# Patient Record
Sex: Female | Born: 1965 | Race: White | Hispanic: No | Marital: Married | Smoking: Never smoker
Health system: Southern US, Community
[De-identification: ages and names within clinical notes are randomized; demographics above are authoritative.]

## PROBLEM LIST (undated history)

## (undated) DIAGNOSIS — M1990 Unspecified osteoarthritis, unspecified site: Secondary | ICD-10-CM

## (undated) DIAGNOSIS — E785 Hyperlipidemia, unspecified: Secondary | ICD-10-CM

## (undated) DIAGNOSIS — I10 Essential (primary) hypertension: Secondary | ICD-10-CM

## (undated) DIAGNOSIS — E663 Overweight: Secondary | ICD-10-CM

## (undated) DIAGNOSIS — K219 Gastro-esophageal reflux disease without esophagitis: Secondary | ICD-10-CM

## (undated) DIAGNOSIS — M797 Fibromyalgia: Secondary | ICD-10-CM

## (undated) DIAGNOSIS — T7840XA Allergy, unspecified, initial encounter: Secondary | ICD-10-CM

## (undated) DIAGNOSIS — E119 Type 2 diabetes mellitus without complications: Secondary | ICD-10-CM

## (undated) DIAGNOSIS — E876 Hypokalemia: Secondary | ICD-10-CM

## (undated) DIAGNOSIS — M069 Rheumatoid arthritis, unspecified: Secondary | ICD-10-CM

## (undated) DIAGNOSIS — J302 Other seasonal allergic rhinitis: Secondary | ICD-10-CM

## (undated) DIAGNOSIS — M25551 Pain in right hip: Secondary | ICD-10-CM

## (undated) DIAGNOSIS — K5900 Constipation, unspecified: Secondary | ICD-10-CM

## (undated) DIAGNOSIS — M25552 Pain in left hip: Secondary | ICD-10-CM

## (undated) DIAGNOSIS — R739 Hyperglycemia, unspecified: Secondary | ICD-10-CM

## (undated) DIAGNOSIS — J209 Acute bronchitis, unspecified: Secondary | ICD-10-CM

## (undated) HISTORY — DX: Gastro-esophageal reflux disease without esophagitis: K21.9

## (undated) HISTORY — DX: Hyperlipidemia, unspecified: E78.5

## (undated) HISTORY — DX: Hyperglycemia, unspecified: R73.9

## (undated) HISTORY — DX: Pain in left hip: M25.552

## (undated) HISTORY — DX: Overweight: E66.3

## (undated) HISTORY — DX: Type 2 diabetes mellitus without complications: E11.9

## (undated) HISTORY — DX: Constipation, unspecified: K59.00

## (undated) HISTORY — DX: Allergy, unspecified, initial encounter: T78.40XA

## (undated) HISTORY — DX: Essential (primary) hypertension: I10

## (undated) HISTORY — DX: Fibromyalgia: M79.7

## (undated) HISTORY — DX: Unspecified osteoarthritis, unspecified site: M19.90

## (undated) HISTORY — DX: Acute bronchitis, unspecified: J20.9

## (undated) HISTORY — PX: HEMORRHOID SURGERY: SHX153

## (undated) HISTORY — DX: Rheumatoid arthritis, unspecified: M06.9

## (undated) HISTORY — DX: Other seasonal allergic rhinitis: J30.2

## (undated) HISTORY — DX: Pain in right hip: M25.551

## (undated) HISTORY — DX: Hypokalemia: E87.6

---

## 2011-10-24 ENCOUNTER — Ambulatory Visit (INDEPENDENT_AMBULATORY_CARE_PROVIDER_SITE_OTHER): Payer: BC Managed Care – PPO | Admitting: Internal Medicine

## 2011-10-24 ENCOUNTER — Encounter: Payer: Self-pay | Admitting: Internal Medicine

## 2011-10-24 VITALS — BP 128/82 | HR 126 | Temp 98.3°F | Resp 14 | Ht 64.0 in | Wt 137.2 lb

## 2011-10-24 DIAGNOSIS — Z79899 Other long term (current) drug therapy: Secondary | ICD-10-CM

## 2011-10-24 DIAGNOSIS — E785 Hyperlipidemia, unspecified: Secondary | ICD-10-CM

## 2011-10-24 DIAGNOSIS — I10 Essential (primary) hypertension: Secondary | ICD-10-CM

## 2011-10-24 LAB — CBC WITH DIFFERENTIAL/PLATELET
Eosinophils Relative: 1 % (ref 0–5)
HCT: 41.2 % (ref 36.0–46.0)
Hemoglobin: 14.4 g/dL (ref 12.0–15.0)
Lymphocytes Relative: 30 % (ref 12–46)
Lymphs Abs: 1.8 10*3/uL (ref 0.7–4.0)
MCV: 87.1 fL (ref 78.0–100.0)
Monocytes Absolute: 0.6 10*3/uL (ref 0.1–1.0)
Monocytes Relative: 10 % (ref 3–12)
Neutro Abs: 3.6 10*3/uL (ref 1.7–7.7)
WBC: 6.1 10*3/uL (ref 4.0–10.5)

## 2011-10-24 LAB — BASIC METABOLIC PANEL
BUN: 15 mg/dL (ref 6–23)
CO2: 26 mEq/L (ref 19–32)
Chloride: 101 mEq/L (ref 96–112)
Potassium: 3.8 mEq/L (ref 3.5–5.3)

## 2011-10-24 LAB — LIPID PANEL
Cholesterol: 253 mg/dL — ABNORMAL HIGH (ref 0–200)
HDL: 42 mg/dL (ref >39)
Total CHOL/HDL Ratio: 6 Ratio
Triglycerides: 202 mg/dL — ABNORMAL HIGH (ref <150)
VLDL: 40 mg/dL (ref 0–40)

## 2011-10-24 LAB — HEPATIC FUNCTION PANEL
ALT: 11 U/L (ref 0–35)
Albumin: 4.2 g/dL (ref 3.5–5.2)
Indirect Bilirubin: 0.9 mg/dL (ref 0.0–0.9)
Total Protein: 7.6 g/dL (ref 6.0–8.3)

## 2011-10-24 NOTE — Patient Instructions (Signed)
Please schedule fasting labs prior to next visit chem7-v58.69, lipid/lft 272.4 

## 2011-10-25 DIAGNOSIS — E7800 Pure hypercholesterolemia, unspecified: Secondary | ICD-10-CM | POA: Insufficient documentation

## 2011-10-25 DIAGNOSIS — I10 Essential (primary) hypertension: Secondary | ICD-10-CM | POA: Insufficient documentation

## 2011-10-25 NOTE — Assessment & Plan Note (Signed)
Normotensive and stable. Continue current regimen. Monitor bp as outpt and followup in clinic as scheduled. Obtain cbc, chem7 and tsh  

## 2011-10-25 NOTE — Assessment & Plan Note (Signed)
Intolerant of lipitor and zetia. Obtain lipid/lft

## 2011-10-25 NOTE — Progress Notes (Signed)
  Subjective:    Patient ID: Marissa Guzman, female    DOB: 05/29/1965, 46 y.o.   MRN: [id number]  HPI Pt presents to clinic to establish care and for evaluation of hyperlipidemia. H/o hyperlipidemia intolerant of lipitor due to myalgias and zetia due to gi upset. Recalls endocrinology consult in the past. BP reviewed as normotensive. Uses phentermine periodically due to weight gain.   Past Medical History  Diagnosis Date  . HTN (hypertension)   . Hyperlipidemia   . Seasonal allergies    Past Surgical History  Procedure Date  . Hemorrhoid surgery     two  . Cesarean section     reports that she has never smoked. She does not have any smokeless tobacco history on file. Her alcohol and drug histories not on file. family history includes Dementia in her maternal grandmother; Diabetes in her maternal grandmother; Heart disease in her father; Hyperlipidemia in her maternal grandmother, mother, and sister; Hypertension in her father, mother, and sister; Lung cancer in her father; and Melanoma in her father. Allergies  Allergen Reactions  . Tetracyclines & Related Other (See Comments)    GI Issues.     Review of Systems  Respiratory: Negative for shortness of breath.   Cardiovascular: Negative for chest pain.  Musculoskeletal: Negative for myalgias.  All other systems reviewed and are negative.       Objective:   Physical Exam  Nursing note and vitals reviewed. Constitutional: She appears well-developed and well-nourished. No distress.  HENT:  Head: Normocephalic and atraumatic.  Right Ear: External ear normal.  Left Ear: External ear normal.  Eyes: Conjunctivae are normal. No scleral icterus.  Neck: Neck supple. No thyromegaly present.  Cardiovascular: Normal rate, regular rhythm and normal heart sounds.  Exam reveals no gallop and no friction rub.   No murmur heard. Pulmonary/Chest: Effort normal and breath sounds normal. No respiratory distress. She has no wheezes. She has  no rales.  Lymphadenopathy:    She has no cervical adenopathy.  Neurological: She is alert.  Skin: She is not diaphoretic.  Psychiatric: She has a normal mood and affect.          Assessment & Plan:

## 2011-11-10 ENCOUNTER — Telehealth: Payer: Self-pay | Admitting: *Deleted

## 2011-11-10 DIAGNOSIS — E785 Hyperlipidemia, unspecified: Secondary | ICD-10-CM

## 2011-11-10 MED ORDER — NIACIN ER 500 MG PO CPCR: 500.0000 mg | ORAL_CAPSULE | Freq: Every day | ORAL | Status: DC

## 2011-11-10 NOTE — Telephone Encounter (Signed)
Notes Recorded by Regis Bill, CMA on 11/07/2011 at 5:29 PM [facility] with contact name & number for pt call back to discuss results & further MD instructions/SLS  Spoke with patient about results & MD recommendations, pt chooses to try Niacin with prior ASA dosage instructions; will repeat fasting labs in 6-wks/SLS Future lab orders placed; new Rx to pharmacy.

## 2011-11-10 NOTE — Telephone Encounter (Signed)
Message copied by Regis Bill on Mon Nov 10, 2011  4:26 PM ------      Message from: Edwyna Perfect      Created: Wed Nov 05, 2011  3:31 PM       Chol remains high. ldl 170+ on crestor 40mg  qd. Max dose. Can't tolerate zetia. Only a few reasonable choices left. Either niacin 500mg  po qhs (take asa 30 mins before to decrease flushing- and increase to 1000mg  po qhs after two weeks) or questran light one packet mixed with liquid bid. Either one needs lipid/lft 272.4 6wks later

## 2011-12-15 LAB — HEPATIC FUNCTION PANEL
Albumin: 4.2 g/dL (ref 3.5–5.2)
Indirect Bilirubin: 0.5 mg/dL (ref 0.0–0.9)
Total Protein: 7 g/dL (ref 6.0–8.3)

## 2011-12-15 LAB — LIPID PANEL
HDL: 40 mg/dL (ref >39)
LDL Cholesterol: 353 mg/dL — ABNORMAL HIGH (ref 0–99)
Triglycerides: 303 mg/dL — ABNORMAL HIGH (ref <150)
VLDL: 61 mg/dL — ABNORMAL HIGH (ref 0–40)

## 2011-12-15 NOTE — Telephone Encounter (Signed)
Lab orders released/SLS 

## 2011-12-15 NOTE — Addendum Note (Signed)
Addended by: Regis Bill on: 12/15/2011 03:25 PM   Modules accepted: Orders

## 2011-12-24 ENCOUNTER — Other Ambulatory Visit: Payer: Self-pay | Admitting: Internal Medicine

## 2011-12-24 DIAGNOSIS — E785 Hyperlipidemia, unspecified: Secondary | ICD-10-CM

## 2011-12-30 ENCOUNTER — Other Ambulatory Visit: Payer: Self-pay | Admitting: Pharmacist

## 2011-12-30 ENCOUNTER — Ambulatory Visit (INDEPENDENT_AMBULATORY_CARE_PROVIDER_SITE_OTHER): Payer: BC Managed Care – PPO | Admitting: Pharmacist

## 2011-12-30 VITALS — Wt 140.0 lb

## 2011-12-30 DIAGNOSIS — E785 Hyperlipidemia, unspecified: Secondary | ICD-10-CM

## 2011-12-30 MED ORDER — ATORVASTATIN CALCIUM 80 MG PO TABS: 80.0000 mg | ORAL_TABLET | Freq: Every day | ORAL | Status: DC

## 2011-12-30 NOTE — Assessment & Plan Note (Signed)
Reviewed pt's labs.  Her cholesterol while on statin showed TG-202 and LDL- 171.  Once pt switched to only niacin and fenofibrate, her TG and LDL increased to 303 and 353 respectively.  Her only CV risk factor is HTN.  LDL goal of <160 acceptable or can aim for more aggressive goal of <130.  Will need to restart patient on statin.  Pt requests retry of Lipitor rather than Crestor since she states she had better control with the Lipitor.  Will restart at 80mg  daily but will not continue fenofibrate as this may have contributed to her myalgias.  Will hold off on Niacin for now to see what her cholesterol looks like with statin alone.  May consider retry of zetia with Lipitor at next visit to help lower LDL to a greater extent than addition of fibrate or niacin if need be.  Will recheck labs in 6-8 weeks.

## 2011-12-30 NOTE — Progress Notes (Signed)
HPI  Marissa Guzman is a 46 yo F who comes in today for an initial Lipid clinic visit.  She recently established care with Dr. Rodena Medin.  Her PMH is significant for hypertension and hyperlipidemia.  FH is noncontributory.  Pt is a non-smoker.  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   Reviewed pt's history of cholesterol medications.  She has previously taken Lipitor 80mg  daily and fenofibrate 160mg  daily for years but then developed severe myalgias and was switched to Crestor 40mg  and fenofibrate 160mg .  She has tolerated this with no issues but did not have as good of control of her cholesterol.  After her blood work in August, there was some miscommunication and patient stopped Crestor but continued fenofibrate and added Niacin.  She was able to titrate up to 1000mg  daily with no side effects.  The most recent blood work is a result of only fenofibrate and niacin and no statin.  The only other medication she has tried in the past was Zetia and she reports GI upset.  Of note, pt did stop all cholesterol medications last week in anticipation for today's visit because she knew we would want to change something.   Dietary Review Breakfast- Life cereal, egg and cheese on tortilla, or bacon and cheese on a bun Lunch- ham or [place] sandwich from subway, baked potato chips, McDonald's snack wrap and fries, or chick-fil-a.  She does eat out most days due to [employer]- pt cooks half the time and goes out to eat the other times.  Some of their favorite restaurants are [employer], [place] or a [employer].  She will cook chicken, pork chops, spaghetti, roasted potatoes, corn, green beans, or brown rice.  She does not fry any of her food and does not eat salads because her husband and son do not eat them.   Snacks- pt doesn't snack much but does like fruit or wheat thins with cheese  Drinks- pt admits to not having a lot of fluid intake every day.  She does not like to drink water because she does not want  to have to use the restroom in public.  She will drink Diet Dr. Reino Kent each day.   Exercise Pt is currently not exercising.  She has had a personal trainer in the past but has stopped doing this due to personality issues with the trainer.  She is still a member at the gym but again does not go because of the personnel.   She is going to start taking her phentermine again today to help with weight loss.  When she has done this in the past, she will see a weight loss of 10-15 lbs but quickly puts it back on once she stops.   Current Outpatient Prescriptions on File Prior to Visit  Medication Sig Dispense Refill  . fluticasone (FLONASE) 50 MCG/ACT nasal spray Place 2 sprays into the nose daily as needed.      . fosinopril-hydrochlorothiazide (MONOPRIL-HCT) 10-12.5 MG per tablet Take 1 tablet by mouth daily.      . norgestrel-ethinyl estradiol (LO/OVRAL,CRYSELLE) 0.3-30 MG-MCG tablet Take 1 tablet by mouth daily.      . phentermine 37.5 MG capsule Take 37.5 mg by mouth daily as needed.      . psyllium (METAMUCIL) 58.6 % powder Take 1 packet by mouth daily.      Marland Kitchen atorvastatin (LIPITOR) 80 MG tablet Take 1 tablet (80 mg total) by mouth daily.  30 tablet  3

## 2011-12-30 NOTE — Patient Instructions (Addendum)
Stop Crestor, fenofibrate and niacin  Restart Lipitor 80mg  daily.   With diet- Try to limit cheese and starchy vegetables.  Replace with more high fiber vegetables and limit portions  Try to exercise at least 15-30 minutes most days of the week.   Recheck labs in 6-8 weeks.

## 2012-02-17 ENCOUNTER — Telehealth: Payer: Self-pay | Admitting: Internal Medicine

## 2012-02-17 MED ORDER — FOSINOPRIL SODIUM-HCTZ 10-12.5 MG PO TABS: 1.0000 | ORAL_TABLET | Freq: Every day | ORAL | Status: DC

## 2012-02-17 NOTE — Telephone Encounter (Signed)
Rx to pharmacy/SLS 

## 2012-02-17 NOTE — Telephone Encounter (Signed)
°  Fosinopril 10-12.5 refill to [facility] 90 day supply

## 2012-02-18 ENCOUNTER — Telehealth: Payer: Self-pay | Admitting: *Deleted

## 2012-02-18 DIAGNOSIS — E785 Hyperlipidemia, unspecified: Secondary | ICD-10-CM

## 2012-02-18 LAB — HEPATIC FUNCTION PANEL
AST: 23 U/L (ref 0–37)
Alkaline Phosphatase: 58 U/L (ref 39–117)
Bilirubin, Direct: 0.2 mg/dL (ref 0.0–0.3)
Indirect Bilirubin: 1.1 mg/dL — ABNORMAL HIGH (ref 0.0–0.9)
Total Bilirubin: 1.3 mg/dL — ABNORMAL HIGH (ref 0.3–1.2)

## 2012-02-18 LAB — LIPID PANEL
HDL: 41 mg/dL (ref >39)
LDL Cholesterol: 164 mg/dL — ABNORMAL HIGH (ref 0–99)
Total CHOL/HDL Ratio: 5.7 Ratio
VLDL: 28 mg/dL (ref 0–40)

## 2012-02-18 NOTE — Telephone Encounter (Signed)
Pt presented to the lab for lipid panel and hepatic function panel per Weston Brass, PharmD with the [facility]. Orders entered and given to the lab.

## 2012-02-23 ENCOUNTER — Ambulatory Visit: Payer: BC Managed Care – PPO | Admitting: Pharmacist

## 2012-03-04 ENCOUNTER — Ambulatory Visit (INDEPENDENT_AMBULATORY_CARE_PROVIDER_SITE_OTHER): Payer: BC Managed Care – PPO | Admitting: Pharmacist

## 2012-03-04 DIAGNOSIS — E785 Hyperlipidemia, unspecified: Secondary | ICD-10-CM

## 2012-03-04 MED ORDER — ATORVASTATIN CALCIUM 80 MG PO TABS: 80.0000 mg | ORAL_TABLET | Freq: Every day | ORAL | Status: DC

## 2012-03-04 NOTE — Patient Instructions (Signed)
Great job on the improvement in your cholesterol  Continue Lipitor 80mg  daily.    Try to make some of the lifestyle changes after the holidays.   We will follow up in 6 months.

## 2012-03-04 NOTE — Assessment & Plan Note (Signed)
Pt's cholesterol much improved with Lipitor 80mg .  TC- 233 (goal<200), TG- 142 (goal<150), HDL- 41 (goal>40) and LDL- 164.  LFTs are WNL.  Pt on high dose statin.  She is being treated for primary prevention.  In light of new guidelines, will not add additional therapy at this time.  Anticipate with lifestyle changes, cholesterol will improve even more.  If LDL continues to decrease, may be able to titrate Lipitor down to 40mg .  Will follow up in 6 months.

## 2012-03-04 NOTE — Progress Notes (Signed)
HPI  Marissa Guzman is a 46 yo F who comes in today for follow up Lipid clinic visit.  She recently established care with Dr. Rodena Medin.  Her PMH is significant for hypertension and hyperlipidemia.  FH is noncontributory.  Pt is a non-smoker.  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   Pt is currently taking Lipitor 80mg  daily for her cholesterol.  She has had no issues with muscle pains or myalgias since the last visit.   Pt has not made any lifestyle changes since the last visit.  States that she has been busy with the holidays.  She did not take phentermine on any regular schedule and so no change in weight.  Her goal is to start back to the gym and controlling her diet after the new year.   Current Outpatient Prescriptions on File Prior to Visit  Medication Sig Dispense Refill  . atorvastatin (LIPITOR) 80 MG tablet Take 1 tablet (80 mg total) by mouth daily.  30 tablet  3  . fluticasone (FLONASE) 50 MCG/ACT nasal spray Place 2 sprays into the nose daily as needed.      . fosinopril-hydrochlorothiazide (MONOPRIL-HCT) 10-12.5 MG per tablet Take 1 tablet by mouth daily.  90 tablet  1  . norgestrel-ethinyl estradiol (LO/OVRAL,CRYSELLE) 0.3-30 MG-MCG tablet Take 1 tablet by mouth daily.      . phentermine 37.5 MG capsule Take 37.5 mg by mouth daily as needed.      . psyllium (METAMUCIL) 58.6 % powder Take 1 packet by mouth daily.

## 2012-04-12 ENCOUNTER — Telehealth: Payer: Self-pay | Admitting: Internal Medicine

## 2012-04-12 DIAGNOSIS — E785 Hyperlipidemia, unspecified: Secondary | ICD-10-CM

## 2012-04-12 DIAGNOSIS — Z79899 Other long term (current) drug therapy: Secondary | ICD-10-CM

## 2012-04-12 NOTE — Telephone Encounter (Signed)
Please schedule fasting labs prior to next visit  chem7-v58.69, lipid/lft-272.4  Patient has upcoming appointment in late February. She will be going to [facility]

## 2012-04-27 ENCOUNTER — Ambulatory Visit: Payer: BC Managed Care – PPO | Admitting: Internal Medicine

## 2012-05-05 LAB — LIPID PANEL
Cholesterol: 230 mg/dL — ABNORMAL HIGH (ref 0–200)
Total CHOL/HDL Ratio: 5.8 Ratio
Triglycerides: 158 mg/dL — ABNORMAL HIGH (ref <150)
VLDL: 32 mg/dL (ref 0–40)

## 2012-05-05 LAB — BASIC METABOLIC PANEL
Calcium: 9.5 mg/dL (ref 8.4–10.5)
Glucose, Bld: 75 mg/dL (ref 70–99)
Potassium: 4.1 mEq/L (ref 3.5–5.3)
Sodium: 140 mEq/L (ref 135–145)

## 2012-05-05 LAB — HEPATIC FUNCTION PANEL
ALT: 16 U/L (ref 0–35)
AST: 21 U/L (ref 0–37)
Bilirubin, Direct: 0.1 mg/dL (ref 0.0–0.3)
Indirect Bilirubin: 0.7 mg/dL (ref 0.0–0.9)
Total Protein: 6.6 g/dL (ref 6.0–8.3)

## 2012-05-05 NOTE — Telephone Encounter (Signed)
Quick Note:  Patient stated she has an appt next week ______

## 2012-05-05 NOTE — Telephone Encounter (Signed)
Quick Note:  Patient Informed and voiced understanding ______ 

## 2012-05-11 ENCOUNTER — Ambulatory Visit (INDEPENDENT_AMBULATORY_CARE_PROVIDER_SITE_OTHER): Payer: BC Managed Care – PPO | Admitting: Family Medicine

## 2012-05-11 ENCOUNTER — Encounter: Payer: Self-pay | Admitting: Family Medicine

## 2012-05-11 VITALS — BP 140/86 | HR 74 | Temp 98.4°F | Ht 64.0 in | Wt 136.1 lb

## 2012-05-11 DIAGNOSIS — Z1211 Encounter for screening for malignant neoplasm of colon: Secondary | ICD-10-CM | POA: Insufficient documentation

## 2012-05-11 DIAGNOSIS — Z0000 Encounter for general adult medical examination without abnormal findings: Secondary | ICD-10-CM

## 2012-05-11 DIAGNOSIS — T7840XD Allergy, unspecified, subsequent encounter: Secondary | ICD-10-CM

## 2012-05-11 DIAGNOSIS — Z5189 Encounter for other specified aftercare: Secondary | ICD-10-CM

## 2012-05-11 DIAGNOSIS — J209 Acute bronchitis, unspecified: Secondary | ICD-10-CM

## 2012-05-11 DIAGNOSIS — I10 Essential (primary) hypertension: Secondary | ICD-10-CM

## 2012-05-11 DIAGNOSIS — E785 Hyperlipidemia, unspecified: Secondary | ICD-10-CM

## 2012-05-11 MED ORDER — AZITHROMYCIN 250 MG PO TABS: ORAL_TABLET | ORAL | Status: DC

## 2012-05-11 MED ORDER — HYDROCOD POLST-CHLORPHEN POLST 10-8 MG/5ML PO LQCR: 5.0000 mL | Freq: Every evening | ORAL | Status: DC | PRN

## 2012-05-11 MED ORDER — GUAIFENESIN ER 600 MG PO TB12: 600.0000 mg | ORAL_TABLET | Freq: Two times a day (BID) | ORAL | Status: DC

## 2012-05-11 NOTE — Patient Instructions (Addendum)
Annual exam with labs prior to visit, lipid, renal, hepatic, cbc, tsh Krill oil, MegaRed oil caps daily  No trans fats=partially hydrogenated oils  Diphtheria/Tetanus Toxoids; Pertussis Vaccine, DTP injection What is this medicine? DIPHTHERIA and TETANUS TOXOIDS; PERTUSSIS VACCINE (dif THEER ee uh and TET n [name] TOK soids; per TUS iss vak SEEN) is used to prevent diphtheria, tetanus, and pertussis infections. This medicine may be used for other purposes; ask your health care provider or pharmacist if you have questions. What should I tell my health care provider before I take this medicine? They need to know if you have any of these conditions: -blood disorders like hemophilia -fever or infection -immune system problems -neurologic disease -seizures -an unusual or allergic reaction to vaccines, thimerosal, latex, other medicines, foods, dyes, or preservatives -pregnant or trying to get pregnant -breast-feeding How should I use this medicine? This vaccine is for injection into a muscle. It is given by a health care professional. A copy of Vaccine Information Statements will be given before each vaccination. Read this sheet carefully each time. The sheet may change frequently. Talk to your pediatrician regarding the use of this vaccine in children. While the DTP vaccine may be given to children ages 6 weeks to 7 years and the Tdap vaccine may be given to children at least 98 years old (Boostrix(R)) or at least 47 years old (Adacel(TM)), precautions do apply. Overdosage: If you think you have taken too much of this medicine contact a poison control center or emergency room at once. NOTE: This medicine is only for you. Do not share this medicine with others. What if I miss a dose? It is important not to miss your dose. Call your doctor or health care professional if you are unable to keep an appointment. What may interact with this medicine? -immune globulin -medicines that suppress your  immune function like adalimumab, anakinra, infliximab -medicines to treat cancer -medicines that treat or prevent blood clots like warfarin, enoxaparin, and dalteparin -steroid medicines like prednisone or cortisone This list may not describe all possible interactions. Give your health care provider a list of all the medicines, herbs, non-prescription drugs, or dietary supplements you use. Also tell them if you smoke, drink alcohol, or use illegal drugs. Some items may interact with your medicine. What should I watch for while using this medicine? See your health care provider for all shots of this vaccine as directed. To have protection from infection, you must have 3 shots of this vaccine plus boosters as needed. Tell your doctor right away if you have any serious or unusual side effects after getting this vaccine. What side effects may I notice from receiving this medicine? Side effects that you should report to your doctor or health care professional as soon as possible: -allergic reactions like skin rash, itching or hives, swelling of the face, lips, or tongue -breathing problems -fever of 103 degrees F or more -flu-like symptoms -inconsolable crying -infection -pain, tingling, numbness in the hands or feet -seizures -swelling of arm or leg that was injected -unusually weak or tired Side effects that usually do not require immediate medical attention (report these side effects to your doctor or health care professional if they continue or are bothersome): -fussy, irritable -loss of appetite -fever of 102 degrees F or less -pain, tenderness, redness, swelling, or a 'knot' at site where injected -vomiting This list may not describe all possible side effects. Call your doctor for medical advice about side effects. You may report side effects  to FDA at 1-800-FDA-1088. Where should I keep my medicine? This drug is given in a hospital or clinic and will not be stored at home. NOTE: This  sheet is a summary. It may not cover all possible information. If you have questions about this medicine, talk to your doctor, pharmacist, or health care provider.  2012, Elsevier/Gold Standard. (06/24/2007 2:07:33 PM)

## 2012-05-16 ENCOUNTER — Encounter: Payer: Self-pay | Admitting: Family Medicine

## 2012-05-16 DIAGNOSIS — J209 Acute bronchitis, unspecified: Secondary | ICD-10-CM

## 2012-05-16 DIAGNOSIS — T7840XA Allergy, unspecified, initial encounter: Secondary | ICD-10-CM

## 2012-05-16 HISTORY — DX: Acute bronchitis, unspecified: J20.9

## 2012-05-16 HISTORY — DX: Allergy, unspecified, initial encounter: T78.40XA

## 2012-05-16 NOTE — Assessment & Plan Note (Signed)
Encouraged daily Zyrtec and Flonase

## 2012-05-16 NOTE — Assessment & Plan Note (Signed)
Well controlled no changes 

## 2012-05-16 NOTE — Assessment & Plan Note (Signed)
Mild, avoid trans fats, consider MegaRed caps

## 2012-05-16 NOTE — Progress Notes (Signed)
Patient ID: CLIO GERHART, female   DOB: 10/19/1965, 47 y.o.   MRN: [id number] TAYLAR HARTSOUGH [id number] Dec 08, 1965 05/16/2012      Progress Note-Follow Up  Subjective  Chief Complaint  Chief Complaint  Patient presents with  . Follow-up    6 month  . Cough    w/ phlegm (unsure if colored) X 4 days, nasal drainage X 2 days, headaches X 4 days- off and  on , no headaches    HPI  Patient is a 47 year old Caucasian female who is in today for followup. She has been struggling with increased congestion and malaise for about 4 days now. Cough is productive of phlegm she has postnasal drip and chills. She complains of cough which is productive and clear rhinorrhea. Denies any nausea, vomiting, diarrhea. Has started her Flonase and her Zyrtec her symptoms persist. No chest pain or palpitations. No shortness or breath or headaches. Past Medical History  Diagnosis Date  . HTN (hypertension)   . Hyperlipidemia   . Seasonal allergies   . Acute bronchitis 05/16/2012  . Allergic state 05/16/2012    Past Surgical History  Procedure Laterality Date  . Hemorrhoid surgery      two  . Cesarean section      Family History  Problem Relation Age of Onset  . Heart disease Father     Deceased  . Hyperlipidemia Mother   . Hyperlipidemia Maternal Grandmother   . Hypertension Father   . Hypertension Mother   . Lung cancer Father   . Melanoma Father     Eye Removal  . Diabetes Maternal Grandmother   . Hypertension Sister     x2  . Hyperlipidemia Sister     x2  . Dementia Maternal Grandmother     History   Social History  . Marital Status: Married    Spouse Name: N/A    Number of Children: N/A  . Years of Education: N/A   Occupational History  . Not on file.   Social History Main Topics  . Smoking status: Never Smoker   . Smokeless tobacco: Not on file  . Alcohol Use: Not on file  . Drug Use: Not on file  . Sexually Active: Not on file   Other Topics Concern  . Not on file    Social History Narrative  . No narrative on file    Current Outpatient Prescriptions on File Prior to Visit  Medication Sig Dispense Refill  . atorvastatin (LIPITOR) 80 MG tablet Take 1 tablet (80 mg total) by mouth daily.  90 tablet  3  . cetirizine (ZYRTEC) 10 MG tablet Take 10 mg by mouth daily as needed.      . fluticasone (FLONASE) 50 MCG/ACT nasal spray Place 2 sprays into the nose daily as needed.      . fosinopril-hydrochlorothiazide (MONOPRIL-HCT) 10-12.5 MG per tablet Take 1 tablet by mouth daily.  90 tablet  1  . norgestrel-ethinyl estradiol (LO/OVRAL,CRYSELLE) 0.3-30 MG-MCG tablet Take 1 tablet by mouth daily.      . phentermine 37.5 MG capsule Take 37.5 mg by mouth daily as needed.      . psyllium (METAMUCIL) 58.6 % powder Take 1 packet by mouth daily.       No current facility-administered medications on file prior to visit.    Allergies  Allergen Reactions  . Tetracyclines & Related Other (See Comments)    GI Issues.    Review of Systems  Review of Systems  Constitutional: Positive for  chills and malaise/fatigue. Negative for fever.  HENT: Positive for congestion. Negative for ear pain, sore throat and ear discharge.   Eyes: Negative for discharge.  Respiratory: Positive for cough and sputum production. Negative for shortness of breath.   Cardiovascular: Negative for chest pain, palpitations and leg swelling.  Gastrointestinal: Negative for nausea, abdominal pain and diarrhea.  Genitourinary: Negative for dysuria.  Musculoskeletal: Positive for myalgias. Negative for falls.  Skin: Negative for rash.  Neurological: Negative for loss of consciousness and headaches.  Endo/Heme/Allergies: Negative for polydipsia.  Psychiatric/Behavioral: Negative for depression and suicidal ideas. The patient is not nervous/anxious and does not have insomnia.     Objective  BP 140/86  Pulse 74  Temp(Src) 98.4 F (36.9 C) (Oral)  Ht 5\' 4"  (1.626 m)  Wt 136 lb 1.3 oz  (61.725 kg)  BMI 23.35 kg/m2  SpO2 99%  Physical Exam  Physical Exam  Constitutional: She is oriented to person, place, and time and well-developed, well-nourished, and in no distress. No distress.  HENT:  Head: Normocephalic and atraumatic.  Eyes: Conjunctivae are normal.  Neck: Neck supple. No thyromegaly present.  Cardiovascular: Normal rate, regular rhythm and normal heart sounds.   No murmur heard. Pulmonary/Chest: Effort normal and breath sounds normal. She has no wheezes.  Abdominal: She exhibits no distension and no mass.  Musculoskeletal: She exhibits no edema.  Lymphadenopathy:    She has no cervical adenopathy.  Neurological: She is alert and oriented to person, place, and time.  Skin: Skin is warm and dry. No rash noted. She is not diaphoretic.  Psychiatric: Memory, affect and judgment normal.    Lab Results  Component Value Date   TSH 1.212 10/24/2011   Lab Results  Component Value Date   WBC 6.1 10/24/2011   HGB 14.4 10/24/2011   HCT 41.2 10/24/2011   MCV 87.1 10/24/2011   PLT 302 10/24/2011   Lab Results  Component Value Date   CREATININE 0.85 05/04/2012   BUN 12 05/04/2012   NA 140 05/04/2012   K 4.1 05/04/2012   CL 104 05/04/2012   CO2 27 05/04/2012   Lab Results  Component Value Date   ALT 16 05/04/2012   AST 21 05/04/2012   ALKPHOS 56 05/04/2012   BILITOT 0.8 05/04/2012   Lab Results  Component Value Date   CHOL 230* 05/04/2012   Lab Results  Component Value Date   HDL 40 05/04/2012   Lab Results  Component Value Date   LDLCALC 158* 05/04/2012   Lab Results  Component Value Date   TRIG 158* 05/04/2012   Lab Results  Component Value Date   CHOLHDL 5.8 05/04/2012     Assessment & Plan  Unspecified essential hypertension Well controlled no changes.  Other and unspecified hyperlipidemia Mild, avoid trans fats, consider MegaRed caps  Acute bronchitis Started on Antibiotics and Mucinex, encouraged probiotics, increased rest and fluids  Allergic  state Encouraged daily Zyrtec and Flonase

## 2012-05-16 NOTE — Assessment & Plan Note (Signed)
Started on Antibiotics and Mucinex, encouraged probiotics, increased rest and fluids

## 2012-08-02 ENCOUNTER — Other Ambulatory Visit (INDEPENDENT_AMBULATORY_CARE_PROVIDER_SITE_OTHER): Payer: BC Managed Care – PPO

## 2012-08-02 DIAGNOSIS — E785 Hyperlipidemia, unspecified: Secondary | ICD-10-CM

## 2012-08-02 LAB — LIPID PANEL
Cholesterol: 230 mg/dL — ABNORMAL HIGH (ref 0–200)
Total CHOL/HDL Ratio: 6
Triglycerides: 130 mg/dL (ref 0.0–149.0)

## 2012-08-02 LAB — LDL CHOLESTEROL, DIRECT: Direct LDL: 165.1 mg/dL

## 2012-08-02 LAB — HEPATIC FUNCTION PANEL
ALT: 24 U/L (ref 0–35)
AST: 29 U/L (ref 0–37)
Albumin: 3.6 g/dL (ref 3.5–5.2)

## 2012-08-04 ENCOUNTER — Ambulatory Visit (INDEPENDENT_AMBULATORY_CARE_PROVIDER_SITE_OTHER): Payer: BC Managed Care – PPO | Admitting: Pharmacist

## 2012-08-04 VITALS — Wt 140.5 lb

## 2012-08-04 DIAGNOSIS — E785 Hyperlipidemia, unspecified: Secondary | ICD-10-CM

## 2012-08-04 NOTE — Assessment & Plan Note (Addendum)
TC 230 (goal<200), TG 130 (at goal <150), HDL 36 (goal >50), LDL 165.  LFTs wnl.  Has not implemented exercise but diet has improved.  Continue Lipitor 80 mg daily. Encouraged to incorporate exercise into lifestyle modifications and to keep up healthy diet.  Follow up in 6 months.   Pt reviewed with Weston Brass, PharmD, CPP

## 2012-08-04 NOTE — Patient Instructions (Addendum)
Continue taking your Lipitor 80 mg every day.  Try to incorporate exercise into your lifestyle modifications.  Keep up your healthy diet.  Follow up in 6 months. Next appointment November 20th at 10:30 am.  Blood work to be done the week before.

## 2012-08-04 NOTE — Progress Notes (Signed)
Marissa Guzman is here for follow up of her hyperlipidemia.  She is currently taking atorvastatin 80 mg and reports no issues with muscle pains or myalgias.  She has tried to improve her diet since the last visit but still has not implemented exercise.   Diet: Breakfast - cereal with skim milk, bagel with jelly Lunch - trying to do more soups like Light Progresso, sometimes [place] sandwich, sometimes skips Dinner - baked chicken with chicken broth, BBQ sauce, soup mixes, etc. With potato or veggies Snacks - light chips, pretzel chips, wheat thins + cheese Drinks - mostly water, maybe 1 Diet Dr.Pepper/day  Current Outpatient Prescriptions on File Prior to Visit  Medication Sig Dispense Refill  . atorvastatin (LIPITOR) 80 MG tablet Take 1 tablet (80 mg total) by mouth daily.  90 tablet  3  . cetirizine (ZYRTEC) 10 MG tablet Take 10 mg by mouth daily as needed.      . fluticasone (FLONASE) 50 MCG/ACT nasal spray Place 2 sprays into the nose daily as needed.      . fosinopril-hydrochlorothiazide (MONOPRIL-HCT) 10-12.5 MG per tablet Take 1 tablet by mouth daily.  90 tablet  1  . norgestrel-ethinyl estradiol (LO/OVRAL,CRYSELLE) 0.3-30 MG-MCG tablet Take 1 tablet by mouth daily.      . psyllium (METAMUCIL) 58.6 % powder Take 1 packet by mouth daily.      . phentermine 37.5 MG capsule Take 37.5 mg by mouth daily as needed.       No current facility-administered medications on file prior to visit.   Allergies  Allergen Reactions  . Tetracyclines & Related Other (See Comments)    GI Issues.

## 2012-09-14 ENCOUNTER — Telehealth: Payer: Self-pay | Admitting: Internal Medicine

## 2012-09-14 MED ORDER — FOSINOPRIL SODIUM-HCTZ 10-12.5 MG PO TABS: 1.0000 | ORAL_TABLET | Freq: Every day | ORAL | Status: DC

## 2012-09-14 NOTE — Telephone Encounter (Signed)
monopril 10-12.5 mg take 1 per day qty 30

## 2012-09-14 NOTE — Telephone Encounter (Signed)
Lisinopril 

## 2012-09-14 NOTE — Telephone Encounter (Signed)
Refill sent per [facility] refill protocol/SLS  

## 2012-11-03 ENCOUNTER — Telehealth: Payer: Self-pay | Admitting: *Deleted

## 2012-11-03 DIAGNOSIS — Z0000 Encounter for general adult medical examination without abnormal findings: Secondary | ICD-10-CM

## 2012-11-03 LAB — RENAL FUNCTION PANEL
CO2: 26 mEq/L (ref 19–32)
Glucose, Bld: 72 mg/dL (ref 70–99)
Potassium: 3.2 mEq/L — ABNORMAL LOW (ref 3.5–5.3)
Sodium: 138 mEq/L (ref 135–145)

## 2012-11-03 LAB — HEPATIC FUNCTION PANEL
ALT: 19 U/L (ref 0–35)
AST: 21 U/L (ref 0–37)
Alkaline Phosphatase: 55 U/L (ref 39–117)
Bilirubin, Direct: 0.2 mg/dL (ref 0.0–0.3)
Indirect Bilirubin: 1.1 mg/dL — ABNORMAL HIGH (ref 0.0–0.9)

## 2012-11-03 LAB — CBC
MCH: 29.5 pg (ref 26.0–34.0)
MCHC: 34.8 g/dL (ref 30.0–36.0)
MCV: 84.7 fL (ref 78.0–100.0)
Platelets: 290 10*3/uL (ref 150–400)
RBC: 4.78 MIL/uL (ref 3.87–5.11)
RDW: 13.4 % (ref 11.5–15.5)

## 2012-11-03 LAB — LIPID PANEL
HDL: 36 mg/dL — ABNORMAL LOW (ref >39)
Triglycerides: 153 mg/dL — ABNORMAL HIGH (ref <150)

## 2012-11-03 NOTE — Telephone Encounter (Signed)
Pt presented to the lab and orders entered per 05/11/12 office note as below:      Annual exam with labs prior to visit, lipid, renal, hepatic, cbc, tsh

## 2012-11-05 MED ORDER — POTASSIUM CHLORIDE CRYS ER 10 MEQ PO TBCR: 10.0000 meq | EXTENDED_RELEASE_TABLET | Freq: Every day | ORAL | Status: DC

## 2012-11-05 NOTE — Addendum Note (Signed)
Addended by: Court Joy on: 11/05/2012 10:19 AM   Modules accepted: Orders

## 2012-11-08 NOTE — Telephone Encounter (Signed)
Quick Note:  Patient Informed and voiced understanding ______ 

## 2012-11-10 ENCOUNTER — Encounter: Payer: Self-pay | Admitting: Family Medicine

## 2012-11-10 ENCOUNTER — Ambulatory Visit (INDEPENDENT_AMBULATORY_CARE_PROVIDER_SITE_OTHER): Payer: BC Managed Care – PPO | Admitting: Family Medicine

## 2012-11-10 VITALS — BP 138/90 | HR 86 | Temp 98.6°F | Ht 64.0 in | Wt 140.1 lb

## 2012-11-10 DIAGNOSIS — I10 Essential (primary) hypertension: Secondary | ICD-10-CM

## 2012-11-10 DIAGNOSIS — T7840XD Allergy, unspecified, subsequent encounter: Secondary | ICD-10-CM

## 2012-11-10 DIAGNOSIS — E785 Hyperlipidemia, unspecified: Secondary | ICD-10-CM

## 2012-11-10 DIAGNOSIS — Z5189 Encounter for other specified aftercare: Secondary | ICD-10-CM

## 2012-11-10 DIAGNOSIS — E876 Hypokalemia: Secondary | ICD-10-CM

## 2012-11-10 DIAGNOSIS — J209 Acute bronchitis, unspecified: Secondary | ICD-10-CM

## 2012-11-10 LAB — RENAL FUNCTION PANEL
Albumin: 4.1 g/dL (ref 3.5–5.2)
CO2: 27 mEq/L (ref 19–32)
Calcium: 9.8 mg/dL (ref 8.4–10.5)
Creat: 0.88 mg/dL (ref 0.50–1.10)

## 2012-11-10 MED ORDER — LISINOPRIL-HYDROCHLOROTHIAZIDE 10-12.5 MG PO TABS: 1.0000 | ORAL_TABLET | Freq: Every day | ORAL | Status: DC

## 2012-11-10 MED ORDER — NORGESTREL-ETHINYL ESTRADIOL 0.3-30 MG-MCG PO TABS: 1.0000 | ORAL_TABLET | Freq: Every day | ORAL | Status: DC

## 2012-11-10 NOTE — Progress Notes (Signed)
Patient ID: Marissa Guzman, female   DOB: 09-14-1965, 47 y.o.   MRN: [id number] ALAZNE QUANT [id number] 1965-04-26 11/10/2012      Progress Note-Follow Up  Subjective  Chief Complaint  Chief Complaint  Patient presents with  . Follow-up    6 month    HPI  Patient is a 47 year old female in today for followup. Generally doing well. No recent illness. No chest pain, palpitations, shortness of breath, GI or GU complaints noted at today's visit. Allergies have not flared recently. Taking medications as prescribed. Bronchitis resolved after last visit  Past Medical History  Diagnosis Date  . HTN (hypertension)   . Hyperlipidemia   . Seasonal allergies   . Acute bronchitis 05/16/2012  . Allergic state 05/16/2012    Past Surgical History  Procedure Laterality Date  . Hemorrhoid surgery      two  . Cesarean section      Family History  Problem Relation Age of Onset  . Heart disease Father     Deceased  . Hyperlipidemia Mother   . Hyperlipidemia Maternal Grandmother   . Hypertension Father   . Hypertension Mother   . Lung cancer Father   . Melanoma Father     Eye Removal  . Diabetes Maternal Grandmother   . Hypertension Sister     x2  . Hyperlipidemia Sister     x2  . Dementia Maternal Grandmother     History   Social History  . Marital Status: Married    Spouse Name: N/A    Number of Children: N/A  . Years of Education: N/A   Occupational History  . Not on file.   Social History Main Topics  . Smoking status: Never Smoker   . Smokeless tobacco: Not on file  . Alcohol Use: Not on file  . Drug Use: Not on file  . Sexual Activity: Not on file   Other Topics Concern  . Not on file   Social History Narrative  . No narrative on file    Current Outpatient Prescriptions on File Prior to Visit  Medication Sig Dispense Refill  . atorvastatin (LIPITOR) 80 MG tablet Take 1 tablet (80 mg total) by mouth daily.  90 tablet  3  . cetirizine (ZYRTEC) 10 MG tablet  Take 10 mg by mouth daily as needed.      . fluticasone (FLONASE) 50 MCG/ACT nasal spray Place 2 sprays into the nose daily as needed.      . potassium chloride (K-DUR,KLOR-CON) 10 MEQ tablet Take 1 tablet (10 mEq total) by mouth daily.  30 tablet  1  . psyllium (METAMUCIL) 58.6 % powder Take 1 packet by mouth daily.       No current facility-administered medications on file prior to visit.    Allergies  Allergen Reactions  . Tetracyclines & Related Other (See Comments)    GI Issues.    Review of Systems  Review of Systems  Constitutional: Negative for fever and malaise/fatigue.  HENT: Negative for congestion.   Eyes: Negative for discharge.  Respiratory: Negative for shortness of breath.   Cardiovascular: Negative for chest pain, palpitations and leg swelling.  Gastrointestinal: Negative for nausea, abdominal pain and diarrhea.  Genitourinary: Negative for dysuria.  Musculoskeletal: Negative for falls.  Skin: Negative for rash.  Neurological: Negative for loss of consciousness and headaches.  Endo/Heme/Allergies: Negative for polydipsia.  Psychiatric/Behavioral: Negative for depression and suicidal ideas. The patient has insomnia. The patient is not nervous/anxious.  Objective  BP 142/96  Pulse 86  Temp(Src) 98.6 F (37 C) (Oral)  Ht 5\' 4"  (1.626 m)  Wt 140 lb 1.3 oz (63.54 kg)  BMI 24.03 kg/m2  SpO2 98%  Physical Exam  Physical Exam  Constitutional: She is oriented to person, place, and time and well-developed, well-nourished, and in no distress. No distress.  HENT:  Head: Normocephalic and atraumatic.  Eyes: Conjunctivae are normal.  Neck: Neck supple. No thyromegaly present.  Cardiovascular: Normal rate, regular rhythm and normal heart sounds.   No murmur heard. Pulmonary/Chest: Effort normal and breath sounds normal. She has no wheezes.  Abdominal: She exhibits no distension and no mass.  Musculoskeletal: She exhibits no edema.  Lymphadenopathy:    She  has no cervical adenopathy.  Neurological: She is alert and oriented to person, place, and time.  Skin: Skin is warm and dry. No rash noted. She is not diaphoretic.  Psychiatric: Memory, affect and judgment normal.    Lab Results  Component Value Date   TSH 1.419 11/03/2012   Lab Results  Component Value Date   WBC 7.0 11/03/2012   HGB 14.1 11/03/2012   HCT 40.5 11/03/2012   MCV 84.7 11/03/2012   PLT 290 11/03/2012   Lab Results  Component Value Date   CREATININE 0.91 11/03/2012   BUN 11 11/03/2012   NA 138 11/03/2012   K 3.2* 11/03/2012   CL 102 11/03/2012   CO2 26 11/03/2012   Lab Results  Component Value Date   ALT 19 11/03/2012   AST 21 11/03/2012   ALKPHOS 55 11/03/2012   BILITOT 1.3* 11/03/2012   Lab Results  Component Value Date   CHOL 203* 11/03/2012   Lab Results  Component Value Date   HDL 36* 11/03/2012   Lab Results  Component Value Date   LDLCALC 136* 11/03/2012   Lab Results  Component Value Date   TRIG 153* 11/03/2012   Lab Results  Component Value Date   CHOLHDL 5.6 11/03/2012     Assessment & Plan Unspecified essential hypertension Improved on recheck, consider DASH diet.  Other and unspecified hyperlipidemia Continue statin and Tricor. Avoid trans fats, increase exercise and add Krill oil caps.  Allergic state May use Flonase and Cetirizine prn.   Acute bronchitis resolved  Hypokalemia Resolved on recheck given handout on hi potassium foods.

## 2012-11-10 NOTE — Patient Instructions (Addendum)
DASH Diet  The DASH diet stands for "Dietary Approaches to Stop Hypertension." It is a healthy eating plan that has been shown to reduce high blood pressure (hypertension) in as little as 14 days, while also possibly providing other significant health benefits. These other health benefits include reducing the risk of breast cancer after menopause and reducing the risk of type 2 diabetes, heart disease, colon cancer, and stroke. Health benefits also include weight loss and slowing kidney failure in patients with chronic kidney disease.   DIET GUIDELINES  · Limit salt (sodium). Your diet should contain less than 1500 mg of sodium daily.  · Limit refined or processed carbohydrates. Your diet should include mostly whole grains. Desserts and added sugars should be used sparingly.  · Include small amounts of heart-healthy fats. These types of fats include nuts, oils, and tub margarine. Limit saturated and trans fats. These fats have been shown to be harmful in the body.  CHOOSING FOODS   The following food groups are based on a 2000 calorie diet. See your Registered Dietitian for individual calorie needs.  Grains and Grain Products (6 to 8 servings daily)  · Eat More Often: Whole-wheat bread, brown rice, whole-grain or wheat pasta, quinoa, popcorn without added fat or salt (air popped).  · Eat Less Often: White bread, white pasta, white rice, cornbread.  Vegetables (4 to 5 servings daily)  · Eat More Often: Fresh, frozen, and canned vegetables. Vegetables may be raw, steamed, roasted, or grilled with a minimal amount of fat.  · Eat Less Often/Avoid: Creamed or fried vegetables. Vegetables in a cheese sauce.  Fruit (4 to 5 servings daily)  · Eat More Often: All fresh, canned (in natural juice), or frozen fruits. Dried fruits without added sugar. One hundred percent fruit juice (½ cup [237 mL] daily).  · Eat Less Often: Dried fruits with added sugar. Canned fruit in light or heavy syrup.  Lean Meats, Fish, and Poultry (2  servings or less daily. One serving is 3 to 4 oz [85-114 g]).  · Eat More Often: Ninety percent or leaner ground beef, tenderloin, sirloin. Round cuts of beef, chicken breast, turkey breast. All fish. Grill, bake, or broil your meat. Nothing should be fried.  · Eat Less Often/Avoid: Fatty cuts of meat, turkey, or chicken leg, thigh, or wing. Fried cuts of meat or fish.  Dairy (2 to 3 servings)  · Eat More Often: Low-fat or fat-free milk, low-fat plain or light yogurt, reduced-fat or part-skim cheese.  · Eat Less Often/Avoid: Milk (whole, 2%). Whole milk yogurt. Full-fat cheeses.  Nuts, Seeds, and Legumes (4 to 5 servings per week)  · Eat More Often: All without added salt.  · Eat Less Often/Avoid: Salted nuts and seeds, canned beans with added salt.  Fats and Sweets (limited)  · Eat More Often: Vegetable oils, tub margarines without trans fats, sugar-free gelatin. Mayonnaise and salad dressings.  · Eat Less Often/Avoid: Coconut oils, palm oils, butter, stick margarine, cream, half and half, cookies, candy, pie.  FOR MORE INFORMATION  The Dash Diet Eating Plan: www.dashdiet.org  Document Released: 02/20/2011 Document Revised: 05/26/2011 Document Reviewed: 02/20/2011  [employer]® Patient Information ©2014 [employer].

## 2012-11-14 ENCOUNTER — Encounter: Payer: Self-pay | Admitting: Family Medicine

## 2012-11-14 DIAGNOSIS — E876 Hypokalemia: Secondary | ICD-10-CM | POA: Insufficient documentation

## 2012-11-14 HISTORY — DX: Hypokalemia: E87.6

## 2012-11-14 NOTE — Assessment & Plan Note (Signed)
resolved 

## 2012-11-14 NOTE — Assessment & Plan Note (Signed)
Improved on recheck, consider DASH diet.

## 2012-11-14 NOTE — Assessment & Plan Note (Signed)
May use Flonase and Cetirizine prn.

## 2012-11-14 NOTE — Assessment & Plan Note (Signed)
Continue statin and Tricor. Avoid trans fats, increase exercise and add Krill oil caps.

## 2012-11-14 NOTE — Assessment & Plan Note (Signed)
Resolved on recheck given handout on hi potassium foods.

## 2012-12-21 ENCOUNTER — Other Ambulatory Visit: Payer: Self-pay | Admitting: Family Medicine

## 2012-12-22 NOTE — Telephone Encounter (Signed)
Rx request Denied-Pt no longer on these medications [changed to Prinzide]/SLS

## 2012-12-29 ENCOUNTER — Telehealth: Payer: Self-pay | Admitting: *Deleted

## 2012-12-29 NOTE — Telephone Encounter (Signed)
Received call from pt stating she has been trying to refill her blood pressure medications and we have been denying them. Reviewed chart and advised pt that combo pill lisinopril HCTZ was sent to her pharmacy on 11/10/12 at her office visit. Pt remembers discussing the change with the Provider but did not know if the change was actually taking place. Pt aware to contact pharmacy for correct refill.

## 2013-01-27 ENCOUNTER — Other Ambulatory Visit (INDEPENDENT_AMBULATORY_CARE_PROVIDER_SITE_OTHER): Payer: BC Managed Care – PPO

## 2013-01-27 DIAGNOSIS — E785 Hyperlipidemia, unspecified: Secondary | ICD-10-CM

## 2013-01-27 LAB — HEPATIC FUNCTION PANEL
ALT: 20 U/L (ref 0–35)
AST: 24 U/L (ref 0–37)
Albumin: 3.7 g/dL (ref 3.5–5.2)
Alkaline Phosphatase: 55 U/L (ref 39–117)
Total Bilirubin: 1.7 mg/dL — ABNORMAL HIGH (ref 0.3–1.2)

## 2013-01-27 LAB — LIPID PANEL
Cholesterol: 210 mg/dL — ABNORMAL HIGH (ref 0–200)
Total CHOL/HDL Ratio: 6

## 2013-02-01 ENCOUNTER — Ambulatory Visit (INDEPENDENT_AMBULATORY_CARE_PROVIDER_SITE_OTHER): Payer: BC Managed Care – PPO | Admitting: Family Medicine

## 2013-02-01 ENCOUNTER — Telehealth: Payer: Self-pay | Admitting: Family Medicine

## 2013-02-01 ENCOUNTER — Encounter: Payer: Self-pay | Admitting: Family Medicine

## 2013-02-01 VITALS — BP 146/90 | HR 92 | Temp 98.1°F | Ht 64.0 in | Wt 140.0 lb

## 2013-02-01 DIAGNOSIS — I10 Essential (primary) hypertension: Secondary | ICD-10-CM

## 2013-02-01 DIAGNOSIS — Z0000 Encounter for general adult medical examination without abnormal findings: Secondary | ICD-10-CM

## 2013-02-01 DIAGNOSIS — E785 Hyperlipidemia, unspecified: Secondary | ICD-10-CM

## 2013-02-01 DIAGNOSIS — Z23 Encounter for immunization: Secondary | ICD-10-CM

## 2013-02-01 DIAGNOSIS — E876 Hypokalemia: Secondary | ICD-10-CM

## 2013-02-01 MED ORDER — LISINOPRIL 10 MG PO TABS: 10.0000 mg | ORAL_TABLET | Freq: Every day | ORAL | Status: DC

## 2013-02-01 MED ORDER — LISINOPRIL-HYDROCHLOROTHIAZIDE 10-12.5 MG PO TABS: 1.0000 | ORAL_TABLET | Freq: Every day | ORAL | Status: DC

## 2013-02-01 NOTE — Patient Instructions (Signed)

## 2013-02-01 NOTE — Telephone Encounter (Signed)
comments: Renal panel in 4 weeks  Prior to next visit, lipid, renal, cbc, tsh, hepatic   Patient has appointment mid march/2015 and will be going to [facility] lab

## 2013-02-01 NOTE — Progress Notes (Signed)
Pre visit review using our clinic review tool, if applicable. No additional management support is needed unless otherwise documented below in the visit note. 

## 2013-02-02 NOTE — Telephone Encounter (Signed)
Lab order placed.

## 2013-02-03 ENCOUNTER — Ambulatory Visit (INDEPENDENT_AMBULATORY_CARE_PROVIDER_SITE_OTHER): Payer: BC Managed Care – PPO | Admitting: Pharmacist

## 2013-02-03 DIAGNOSIS — E785 Hyperlipidemia, unspecified: Secondary | ICD-10-CM

## 2013-02-03 NOTE — Assessment & Plan Note (Addendum)
Pt's cholesterol remains stable on Lipitor 80mg  daily.  TC- 210, TG- 150, HDL- 34, LDL- 163.  LFTs are WNL. Pt on statin for primary prevention.  She has received >50% reduction in LDL but LDL still >160.  Discussed risk assessment options with patient including Lipoprofile.  She is agreeable.  If her LDL-P is elevated, will need to consider additional therapy.  If not, will stay with just Lipitor 80mg  given her % reduction with monotherapy.   Addendum:  Reviewed pt's Lipoprofile.  LDL-P elevated at 2767 and small LDL particle #  [id number].  Discussed Niacin versus Zetia with pt.  She has taken Niacin in the past with no issues.  This may help decrease small LDL particle # better than Zetia as well.  Will have her start at 500mg  once daily x 30 days then increase to 1000mg  daily.  Will follow up in 3 months.

## 2013-02-03 NOTE — Progress Notes (Signed)
Marissa Guzman is here for follow up of her hyperlipidemia.  She is currently taking atorvastatin 80 mg and reports no issues with muscle pains or myalgias.  She has continued her previous diet with no changes and has not started to exercise either.  Diet: Breakfast - cereal with skim milk, bagel with jelly Lunch - trying to do more soups like Light Progresso, sometimes [place] sandwich, sometimes skips Dinner - baked chicken with chicken broth, BBQ sauce, soup mixes, etc. With potato or veggies Snacks - light chips, pretzel chips, wheat thins + cheese Drinks - mostly water, maybe 1 Diet Dr.Pepper/day  Current Outpatient Prescriptions on File Prior to Visit  Medication Sig Dispense Refill  . atorvastatin (LIPITOR) 80 MG tablet Take 1 tablet (80 mg total) by mouth daily.  90 tablet  3  . cetirizine (ZYRTEC) 10 MG tablet Take 10 mg by mouth daily as needed.      . fluticasone (FLONASE) 50 MCG/ACT nasal spray Place 2 sprays into the nose daily as needed.      Marland Kitchen lisinopril (PRINIVIL,ZESTRIL) 10 MG tablet Take 1 tablet (10 mg total) by mouth daily. In pm  90 tablet  1  . lisinopril-hydrochlorothiazide (PRINZIDE) 10-12.5 MG per tablet Take 1 tablet by mouth daily. In am  30 tablet  5  . norgestrel-ethinyl estradiol (LO/OVRAL,CRYSELLE) 0.3-30 MG-MCG tablet Take 1 tablet by mouth daily.  1 Package  0  . psyllium (METAMUCIL) 58.6 % powder Take 1 packet by mouth daily.       No current facility-administered medications on file prior to visit.   Allergies  Allergen Reactions  . Tetracyclines & Related Other (See Comments)    GI Issues.

## 2013-02-06 ENCOUNTER — Encounter: Payer: Self-pay | Admitting: Family Medicine

## 2013-02-06 NOTE — Progress Notes (Signed)
Patient ID: Marissa Guzman, female   DOB: Sep 24, 1965, 47 y.o.   MRN: [id number] AVIYA JARVIE [id number] 1965-10-21 02/06/2013      Progress Note-Follow Up  Subjective  Chief Complaint  Chief Complaint  Patient presents with  . Follow-up    3 month  . Injections    tdap    HPI  Patient is a 47 year old female who is in today for followup. Generally doing well. Continues she'll try and manage her cholesterol with pharmacist. Denies any recent change in diet. No significant increase in exercise. No acute complaints. No headaches, congestion, chest pain or palpitations, shortness of breath, GI or GU concerns. Is taking medications as prescribed.  Past Medical History  Diagnosis Date  . HTN (hypertension)   . Hyperlipidemia   . Seasonal allergies   . Acute bronchitis 05/16/2012  . Allergic state 05/16/2012  . Hypokalemia 11/14/2012    Past Surgical History  Procedure Laterality Date  . Hemorrhoid surgery      two  . Cesarean section      Family History  Problem Relation Age of Onset  . Heart disease Father     Deceased  . Hyperlipidemia Mother   . Hyperlipidemia Maternal Grandmother   . Hypertension Father   . Hypertension Mother   . Lung cancer Father   . Melanoma Father     Eye Removal  . Diabetes Maternal Grandmother   . Hypertension Sister     x2  . Hyperlipidemia Sister     x2  . Dementia Maternal Grandmother     History   Social History  . Marital Status: Married    Spouse Name: N/A    Number of Children: N/A  . Years of Education: N/A   Occupational History  . Not on file.   Social History Main Topics  . Smoking status: Never Smoker   . Smokeless tobacco: Not on file  . Alcohol Use: Not on file  . Drug Use: Not on file  . Sexual Activity: Not on file   Other Topics Concern  . Not on file   Social History Narrative  . No narrative on file    Current Outpatient Prescriptions on File Prior to Visit  Medication Sig Dispense Refill  .  atorvastatin (LIPITOR) 80 MG tablet Take 1 tablet (80 mg total) by mouth daily.  90 tablet  3  . cetirizine (ZYRTEC) 10 MG tablet Take 10 mg by mouth daily as needed.      . fluticasone (FLONASE) 50 MCG/ACT nasal spray Place 2 sprays into the nose daily as needed.      . norgestrel-ethinyl estradiol (LO/OVRAL,CRYSELLE) 0.3-30 MG-MCG tablet Take 1 tablet by mouth daily.  1 Package  0  . psyllium (METAMUCIL) 58.6 % powder Take 1 packet by mouth daily.       No current facility-administered medications on file prior to visit.    Allergies  Allergen Reactions  . Tetracyclines & Related Other (See Comments)    GI Issues.    Review of Systems  Review of Systems  Constitutional: Negative for fever and malaise/fatigue.  HENT: Negative for congestion.   Eyes: Negative for discharge.  Respiratory: Negative for shortness of breath.   Cardiovascular: Negative for chest pain, palpitations and leg swelling.  Gastrointestinal: Negative for nausea, abdominal pain and diarrhea.  Genitourinary: Negative for dysuria.  Musculoskeletal: Negative for falls.  Skin: Negative for rash.  Neurological: Negative for loss of consciousness and headaches.  Endo/Heme/Allergies: Negative for polydipsia.  Psychiatric/Behavioral: Negative for depression and suicidal ideas. The patient is not nervous/anxious and does not have insomnia.     Objective  BP 146/90  Pulse 92  Temp(Src) 98.1 F (36.7 C) (Oral)  Ht 5\' 4"  (1.626 m)  Wt 140 lb 0.6 oz (63.522 kg)  BMI 24.03 kg/m2  SpO2 98%  Physical Exam  Physical Exam  Constitutional: She is oriented to person, place, and time and well-developed, well-nourished, and in no distress. No distress.  HENT:  Head: Normocephalic and atraumatic.  Eyes: Conjunctivae are normal.  Neck: Neck supple. No thyromegaly present.  Cardiovascular: Normal rate, regular rhythm and normal heart sounds.   No murmur heard. Pulmonary/Chest: Effort normal and breath sounds normal.  She has no wheezes.  Abdominal: She exhibits no distension and no mass.  Musculoskeletal: She exhibits no edema.  Lymphadenopathy:    She has no cervical adenopathy.  Neurological: She is alert and oriented to person, place, and time.  Skin: Skin is warm and dry. No rash noted. She is not diaphoretic.  Psychiatric: Memory, affect and judgment normal.    Lab Results  Component Value Date   TSH 1.419 11/03/2012   Lab Results  Component Value Date   WBC 7.0 11/03/2012   HGB 14.1 11/03/2012   HCT 40.5 11/03/2012   MCV 84.7 11/03/2012   PLT 290 11/03/2012   Lab Results  Component Value Date   CREATININE 0.88 11/10/2012   BUN 15 11/10/2012   NA 140 11/10/2012   K 3.7 11/10/2012   CL 104 11/10/2012   CO2 27 11/10/2012   Lab Results  Component Value Date   ALT 20 01/27/2013   AST 24 01/27/2013   ALKPHOS 55 01/27/2013   BILITOT 1.7* 01/27/2013   Lab Results  Component Value Date   CHOL 210* 01/27/2013   Lab Results  Component Value Date   HDL 33.90* 01/27/2013   Lab Results  Component Value Date   LDLCALC 136* 11/03/2012   Lab Results  Component Value Date   TRIG 150.0* 01/27/2013   Lab Results  Component Value Date   CHOLHDL 6 01/27/2013     Assessment & Plan  Preventative health care Given Tdap today  Unspecified essential hypertension Will continue Lisinopril hct 10-12.5 in am and add Lisinopril 10 mg in pm. Encouraged DASH diet.   Other and unspecified hyperlipidemia Tolerating Atorvastatin, add krill oil caps, minimize simple carbs and saturated fats. Increase exercise  Hypokalemia Resolved with last blood, draw, asymptomatic. Recheck with next blood draw

## 2013-02-06 NOTE — Assessment & Plan Note (Signed)
Tolerating Atorvastatin, add krill oil caps, minimize simple carbs and saturated fats. Increase exercise

## 2013-02-06 NOTE — Assessment & Plan Note (Signed)
Given Tdap today 

## 2013-02-06 NOTE — Assessment & Plan Note (Signed)
Resolved with last blood, draw, asymptomatic. Recheck with next blood draw

## 2013-02-06 NOTE — Assessment & Plan Note (Signed)
Will continue Lisinopril hct 10-12.5 in am and add Lisinopril 10 mg in pm. Encouraged DASH diet.

## 2013-02-08 ENCOUNTER — Other Ambulatory Visit: Payer: BC Managed Care – PPO

## 2013-02-08 DIAGNOSIS — E785 Hyperlipidemia, unspecified: Secondary | ICD-10-CM

## 2013-02-09 LAB — NMR LIPOPROFILE WITH LIPIDS
Cholesterol, Total: 218 mg/dL — ABNORMAL HIGH (ref <200)
HDL Size: 8.6 nm — ABNORMAL LOW (ref >=9.2)
HDL-C: 39 mg/dL — ABNORMAL LOW (ref >=40)
LDL (calc): 144 mg/dL — ABNORMAL HIGH (ref <100)
LDL Particle Number: 2767 nmol/L — ABNORMAL HIGH (ref <1000)
LDL Size: 20.4 nm — ABNORMAL LOW (ref >20.5)
LP-IR Score: 76 — ABNORMAL HIGH (ref <=45)
Small LDL Particle Number: 1703 nmol/L — ABNORMAL HIGH (ref <=527)

## 2013-02-25 MED ORDER — NIACIN ER (ANTIHYPERLIPIDEMIC) 500 MG PO TBCR: 1000.0000 mg | EXTENDED_RELEASE_TABLET | Freq: Every day | ORAL | Status: DC

## 2013-05-15 LAB — HM PAP SMEAR: HM Pap smear: NORMAL

## 2013-05-23 ENCOUNTER — Other Ambulatory Visit (INDEPENDENT_AMBULATORY_CARE_PROVIDER_SITE_OTHER): Payer: BC Managed Care – PPO

## 2013-05-23 DIAGNOSIS — E785 Hyperlipidemia, unspecified: Secondary | ICD-10-CM

## 2013-05-23 LAB — HEPATIC FUNCTION PANEL
ALT: 20 U/L (ref 0–35)
AST: 26 U/L (ref 0–37)
Albumin: 3.6 g/dL (ref 3.5–5.2)
Alkaline Phosphatase: 47 U/L (ref 39–117)
Bilirubin, Direct: 0.1 mg/dL (ref 0.0–0.3)
Total Bilirubin: 1 mg/dL (ref 0.3–1.2)
Total Protein: 6.9 g/dL (ref 6.0–8.3)

## 2013-05-24 LAB — NMR LIPOPROFILE WITH LIPIDS
CHOLESTEROL, TOTAL: 194 mg/dL (ref <200)
HDL Particle Number: 38.4 umol/L (ref >=30.5)
HDL Size: 8.7 nm — ABNORMAL LOW (ref >=9.2)
HDL-C: 44 mg/dL (ref >=40)
LDL (calc): 120 mg/dL — ABNORMAL HIGH (ref <100)
LDL Particle Number: 2356 nmol/L — ABNORMAL HIGH (ref <1000)
LDL SIZE: 20.8 nm (ref >20.5)
LP-IR Score: 76 — ABNORMAL HIGH (ref <=45)
Large HDL-P: 2.7 umol/L — ABNORMAL LOW (ref >=4.8)
Large VLDL-P: 5.6 nmol/L — ABNORMAL HIGH (ref <=2.7)
Small LDL Particle Number: 1277 nmol/L — ABNORMAL HIGH (ref <=527)
Triglycerides: 152 mg/dL — ABNORMAL HIGH (ref <150)
VLDL Size: 51.9 nm — ABNORMAL HIGH (ref <=46.6)

## 2013-05-26 ENCOUNTER — Ambulatory Visit (INDEPENDENT_AMBULATORY_CARE_PROVIDER_SITE_OTHER): Payer: BC Managed Care – PPO | Admitting: Pharmacist

## 2013-05-26 VITALS — Wt 147.2 lb

## 2013-05-26 DIAGNOSIS — E785 Hyperlipidemia, unspecified: Secondary | ICD-10-CM

## 2013-05-26 MED ORDER — NIACIN ER (ANTIHYPERLIPIDEMIC) 500 MG PO TBCR: 1000.0000 mg | EXTENDED_RELEASE_TABLET | Freq: Every day | ORAL | Status: DC

## 2013-05-26 MED ORDER — ATORVASTATIN CALCIUM 80 MG PO TABS: 80.0000 mg | ORAL_TABLET | Freq: Every day | ORAL | Status: DC

## 2013-05-26 NOTE — Progress Notes (Signed)
Marissa Guzman is here for follow up of her hyperlipidemia. She is currently taking atorvastatin 80 mg and reports no issues with muscle pains or myalgias. Patient has taken fenofibrate and Crestor in the past but found to have severe myalgias; GI upset in the past with zetia.  FH is noncontributory, with HTN being the only other significant risk factor. 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   At her last visit, patient was encouraged to continue lifestyle modifications + start Niacin 1000 mg daily in the setting of elevated LDL-P with consistent LDL > 130 despite LDL reduction of >50% since initiation of statin therapy.   Weight up ~ 7lbs since Nov 2014.  Of note, patient complains of flushing in the middle of the night after taking Niaspan. As a result has been adherent roughly 50% of the time.   Diet Today patient reports a "terrible diet" ever since winter and has been eating out most days of the week, if not all. Breakfast typically consists of cereal, but lunch and dinner primarily fast-food including [employer], [employer], etc. She drinks diet Dr. Reino Kent regularly over water because "water goes right through her" and she does not want to use public restrooms.   Exercise No activity reported. [place] and finds walking difficult, does not want to apply for a gym membership

## 2013-05-26 NOTE — Assessment & Plan Note (Addendum)
TC-194 (Goal <200), Trig - 152 (Goal < 150), HDL - 44 (Goal>40), LDL - 546 (Goal<160). LFTS are WNL. Pt on statin for primary prevention with risk factor = 1. She has received >50% reduction in LDL which is now < 120. However, LDL-P remains elevated at 5681 from 2767, which is expected given patient's tolerability issues with medication/lack of adherence. Niaspan continued at current dose but encouraged to take Asprin 81mg  enteric-coated 2 hours prior niaspan instead of 1 hour due to absorption, can try 162 mg if still not resolved. Also advised to eat apple sauce or triscuits with medication d/t inherent pectin properties (reduce flushing). Finally, reinforced lifestyle modifications and discussed tangible ways to improve dietary habits. Weight loss goal of 10lbs in the next 3 months. We will follow-up in 3 months. If tolerant with niacin but still elevated, can consider increasing to 1500 mg daily.   Patient seen with , PharmD student

## 2013-05-26 NOTE — Patient Instructions (Signed)
Thanks for coming in to see [name] today.  Goals: - Try the aspirin 81 mg enteric-coated 2 hours prior to niaspan with apple sauce or triscuits  - Weight loss of 10lbs over the next 3 months - Revise grocery shopping list to limit your dietary choices - With 14 total lunch/dinners in week, aim for 5 times eating out MAX  We will recheck your labs and see you in 3 months

## 2013-05-27 NOTE — Progress Notes (Signed)
Pt reviewed with Audrie Lia, PharmD, CPP.  Agree with assessment noted.

## 2013-06-01 LAB — CBC
HCT: 39.3 % (ref 36.0–46.0)
Hemoglobin: 13.4 g/dL (ref 12.0–15.0)
MCH: 30 pg (ref 26.0–34.0)
MCHC: 34.1 g/dL (ref 30.0–36.0)
MCV: 87.9 fL (ref 78.0–100.0)
Platelets: 312 10*3/uL (ref 150–400)
RBC: 4.47 MIL/uL (ref 3.87–5.11)
RDW: 14.1 % (ref 11.5–15.5)
WBC: 6.5 10*3/uL (ref 4.0–10.5)

## 2013-06-02 LAB — HEPATIC FUNCTION PANEL
ALT: 17 U/L (ref 0–35)
AST: 24 U/L (ref 0–37)
Albumin: 3.8 g/dL (ref 3.5–5.2)
Alkaline Phosphatase: 46 U/L (ref 39–117)
Bilirubin, Direct: 0.1 mg/dL (ref 0.0–0.3)
Indirect Bilirubin: 0.7 mg/dL (ref 0.2–1.2)
Total Bilirubin: 0.8 mg/dL (ref 0.2–1.2)
Total Protein: 6.4 g/dL (ref 6.0–8.3)

## 2013-06-02 LAB — TSH: TSH: 0.998 u[IU]/mL (ref 0.350–4.500)

## 2013-06-02 LAB — RENAL FUNCTION PANEL
Albumin: 3.8 g/dL (ref 3.5–5.2)
BUN: 12 mg/dL (ref 6–23)
CO2: 26 mEq/L (ref 19–32)
Calcium: 9 mg/dL (ref 8.4–10.5)
Chloride: 103 mEq/L (ref 96–112)
Creat: 0.76 mg/dL (ref 0.50–1.10)
Glucose, Bld: 65 mg/dL — ABNORMAL LOW (ref 70–99)
Phosphorus: 2.7 mg/dL (ref 2.3–4.6)
Potassium: 4.3 mEq/L (ref 3.5–5.3)
Sodium: 137 mEq/L (ref 135–145)

## 2013-06-02 LAB — LIPID PANEL
Cholesterol: 189 mg/dL (ref 0–200)
HDL: 37 mg/dL — ABNORMAL LOW (ref >39)
LDL Cholesterol: 115 mg/dL — ABNORMAL HIGH (ref 0–99)
Total CHOL/HDL Ratio: 5.1 Ratio
Triglycerides: 183 mg/dL — ABNORMAL HIGH (ref <150)
VLDL: 37 mg/dL (ref 0–40)

## 2013-06-07 ENCOUNTER — Encounter: Payer: Self-pay | Admitting: Family Medicine

## 2013-06-07 ENCOUNTER — Ambulatory Visit (INDEPENDENT_AMBULATORY_CARE_PROVIDER_SITE_OTHER): Payer: BC Managed Care – PPO | Admitting: Family Medicine

## 2013-06-07 VITALS — BP 132/78 | HR 98 | Temp 98.3°F | Ht 64.0 in | Wt 148.0 lb

## 2013-06-07 DIAGNOSIS — I10 Essential (primary) hypertension: Secondary | ICD-10-CM

## 2013-06-07 DIAGNOSIS — E785 Hyperlipidemia, unspecified: Secondary | ICD-10-CM

## 2013-06-07 DIAGNOSIS — K219 Gastro-esophageal reflux disease without esophagitis: Secondary | ICD-10-CM

## 2013-06-07 NOTE — Patient Instructions (Addendum)

## 2013-06-07 NOTE — Progress Notes (Signed)
Pre visit review using our clinic review tool, if applicable. No additional management support is needed unless otherwise documented below in the visit note. 

## 2013-06-12 ENCOUNTER — Encounter: Payer: Self-pay | Admitting: Family Medicine

## 2013-06-12 DIAGNOSIS — K219 Gastro-esophageal reflux disease without esophagitis: Secondary | ICD-10-CM | POA: Insufficient documentation

## 2013-06-12 HISTORY — DX: Gastro-esophageal reflux disease without esophagitis: K21.9

## 2013-06-12 NOTE — Assessment & Plan Note (Signed)
Following with cardiology. Encouraged heart healthy diet, increase exercise, avoid trans fats, consider a krill oil cap daily

## 2013-06-12 NOTE — Progress Notes (Signed)
Patient ID: Marissa Guzman, female   DOB: 05/06/1965, 48 y.o.   MRN: [id number] Marissa Guzman Jan 09, 1966 06/12/2013      Progress Note-Follow Up  Subjective  Chief Complaint  Chief Complaint  Patient presents with  . Follow-up    4 month    HPI  Patient is a 48 year old female in today for routine medical care. Doing well. Has been taking Niaspan to help manage her cholesterol secondary to a prescription from cardiology. She is continuing to have some flushing with this medication. She is using his back but not a lowfat snack prior to administration. Otherwise she feels well. No recent illness or acute concerns. Denies CP/palp/SOB/HA/congestion/fevers/GI or GU c/o. Taking meds as prescribed  Past Medical History  Diagnosis Date  . HTN (hypertension)   . Hyperlipidemia   . Seasonal allergies   . Acute bronchitis 05/16/2012  . Allergic state 05/16/2012  . Hypokalemia 11/14/2012  . GERD (gastroesophageal reflux disease) 06/12/2013    Past Surgical History  Procedure Laterality Date  . Hemorrhoid surgery      two  . Cesarean section      Family History  Problem Relation Age of Onset  . Heart disease Father     Deceased  . Hyperlipidemia Mother   . Hyperlipidemia Maternal Grandmother   . Hypertension Father   . Hypertension Mother   . Lung cancer Father   . Melanoma Father     Eye Removal  . Diabetes Maternal Grandmother   . Hypertension Sister     x2  . Hyperlipidemia Sister     x2  . Dementia Maternal Grandmother     History   Social History  . Marital Status: Married    Spouse Name: N/A    Number of Children: N/A  . Years of Education: N/A   Occupational History  . Not on file.   Social History Main Topics  . Smoking status: Never Smoker   . Smokeless tobacco: Not on file  . Alcohol Use: Not on file  . Drug Use: Not on file  . Sexual Activity: Not on file   Other Topics Concern  . Not on file   Social History Narrative  . No narrative on  file    Current Outpatient Prescriptions on File Prior to Visit  Medication Sig Dispense Refill  . atorvastatin (LIPITOR) 80 MG tablet Take 1 tablet (80 mg total) by mouth daily.  90 tablet  3  . cetirizine (ZYRTEC) 10 MG tablet Take 10 mg by mouth daily as needed.      . fluticasone (FLONASE) 50 MCG/ACT nasal spray Place 2 sprays into the nose daily as needed.      Marland Kitchen lisinopril (PRINIVIL,ZESTRIL) 10 MG tablet Take 1 tablet (10 mg total) by mouth daily. In pm  90 tablet  1  . lisinopril-hydrochlorothiazide (PRINZIDE) 10-12.5 MG per tablet Take 1 tablet by mouth daily. In am  30 tablet  5  . niacin (NIASPAN) 500 MG CR tablet Take 2 tablets (1,000 mg total) by mouth at bedtime.  60 tablet  3  . norgestrel-ethinyl estradiol (LO/OVRAL,CRYSELLE) 0.3-30 MG-MCG tablet Take 1 tablet by mouth daily.  1 Package  0  . omeprazole (PRILOSEC) 20 MG capsule Take 20 mg by mouth daily.      . psyllium (METAMUCIL) 58.6 % powder Take 1 packet by mouth daily.       No current facility-administered medications on file prior to visit.    Allergies  Allergen Reactions  . Tetracyclines & Related Other (See Comments)    GI Issues.    Review of Systems  Review of Systems  Constitutional: Negative for fever and malaise/fatigue.  HENT: Negative for congestion.   Eyes: Negative for discharge.  Respiratory: Negative for shortness of breath.   Cardiovascular: Negative for chest pain, palpitations and leg swelling.  Gastrointestinal: Negative for nausea, abdominal pain and diarrhea.  Genitourinary: Negative for dysuria.  Musculoskeletal: Negative for falls.  Skin: Negative for rash.  Neurological: Negative for loss of consciousness and headaches.  Endo/Heme/Allergies: Negative for polydipsia.  Psychiatric/Behavioral: Negative for depression and suicidal ideas. The patient is not nervous/anxious and does not have insomnia.     Objective  BP 132/78  Pulse 98  Temp(Src) 98.3 F (36.8 C) (Oral)  Ht 5\' 4"   (1.626 m)  Wt 148 lb (67.132 kg)  BMI 25.39 kg/m2  SpO2 98%  Physical Exam  Physical Exam  Constitutional: She is oriented to person, place, and time and well-developed, well-nourished, and in no distress. No distress.  HENT:  Head: Normocephalic and atraumatic.  Eyes: Conjunctivae are normal.  Neck: Neck supple. No thyromegaly present.  Cardiovascular: Normal rate, regular rhythm and normal heart sounds.   No murmur heard. Pulmonary/Chest: Effort normal and breath sounds normal. She has no wheezes.  Abdominal: She exhibits no distension and no mass.  Musculoskeletal: She exhibits no edema.  Lymphadenopathy:    She has no cervical adenopathy.  Neurological: She is alert and oriented to person, place, and time.  Skin: Skin is warm and dry. No rash noted. She is not diaphoretic.  Psychiatric: Memory, affect and judgment normal.    Lab Results  Component Value Date   TSH 0.998 06/01/2013   Lab Results  Component Value Date   WBC 6.5 06/01/2013   HGB 13.4 06/01/2013   HCT 39.3 06/01/2013   MCV 87.9 06/01/2013   PLT 312 06/01/2013   Lab Results  Component Value Date   CREATININE 0.76 06/01/2013   BUN 12 06/01/2013   NA 137 06/01/2013   K 4.3 06/01/2013   CL 103 06/01/2013   CO2 26 06/01/2013   Lab Results  Component Value Date   ALT 17 06/01/2013   AST 24 06/01/2013   ALKPHOS 46 06/01/2013   BILITOT 0.8 06/01/2013   Lab Results  Component Value Date   CHOL 189 06/01/2013   Lab Results  Component Value Date   HDL 37* 06/01/2013   Lab Results  Component Value Date   LDLCALC 115* 06/01/2013   Lab Results  Component Value Date   TRIG 183* 06/01/2013   Lab Results  Component Value Date   CHOLHDL 5.1 06/01/2013     Assessment & Plan  Unspecified essential hypertension Well controlled, no changes to meds. Encouraged heart healthy diet such as the DASH diet and exercise as tolerated.   Other and unspecified hyperlipidemia Following with cardiology. Encouraged heart  healthy diet, increase exercise, avoid trans fats, consider a krill oil cap daily  GERD (gastroesophageal reflux disease) Avoid offending foods, start probiotics. Do not eat large meals in late evening and consider raising head of bed. Continue Omeprazole

## 2013-06-12 NOTE — Assessment & Plan Note (Signed)
Well controlled, no changes to meds. Encouraged heart healthy diet such as the DASH diet and exercise as tolerated.  °

## 2013-06-12 NOTE — Assessment & Plan Note (Signed)
Avoid offending foods, start probiotics. Do not eat large meals in late evening and consider raising head of bed. Continue Omeprazole 

## 2013-07-03 ENCOUNTER — Other Ambulatory Visit: Payer: Self-pay | Admitting: Family Medicine

## 2013-07-31 ENCOUNTER — Other Ambulatory Visit: Payer: Self-pay | Admitting: Family Medicine

## 2013-08-01 NOTE — Telephone Encounter (Signed)
Please advise with Lisinopril patient is taking?

## 2013-08-31 ENCOUNTER — Other Ambulatory Visit (INDEPENDENT_AMBULATORY_CARE_PROVIDER_SITE_OTHER): Payer: BC Managed Care – PPO

## 2013-08-31 DIAGNOSIS — E785 Hyperlipidemia, unspecified: Secondary | ICD-10-CM

## 2013-08-31 LAB — HEPATIC FUNCTION PANEL
ALK PHOS: 48 U/L (ref 39–117)
ALT: 20 U/L (ref 0–35)
AST: 27 U/L (ref 0–37)
Albumin: 3.4 g/dL — ABNORMAL LOW (ref 3.5–5.2)
Bilirubin, Direct: 0.1 mg/dL (ref 0.0–0.3)
TOTAL PROTEIN: 6.7 g/dL (ref 6.0–8.3)
Total Bilirubin: 0.6 mg/dL (ref 0.2–1.2)

## 2013-09-01 ENCOUNTER — Ambulatory Visit: Payer: BC Managed Care – PPO | Admitting: Pharmacist

## 2013-09-01 LAB — NMR LIPOPROFILE WITH LIPIDS
CHOLESTEROL, TOTAL: 203 mg/dL — AB (ref <200)
HDL PARTICLE NUMBER: 35.2 umol/L (ref >=30.5)
HDL Size: 8.7 nm — ABNORMAL LOW (ref >=9.2)
HDL-C: 44 mg/dL (ref >=40)
LARGE HDL: 3 umol/L — AB (ref >=4.8)
LARGE VLDL-P: 7.3 nmol/L — AB (ref <=2.7)
LDL (calc): 125 mg/dL — ABNORMAL HIGH (ref <100)
LDL Particle Number: 2591 nmol/L — ABNORMAL HIGH (ref <1000)
LDL SIZE: 20.6 nm (ref >20.5)
LP-IR SCORE: 76 — AB (ref <=45)
Small LDL Particle Number: 1704 nmol/L — ABNORMAL HIGH (ref <=527)
Triglycerides: 168 mg/dL — ABNORMAL HIGH (ref <150)
VLDL Size: 52.6 nm — ABNORMAL HIGH (ref <=46.6)

## 2013-09-06 ENCOUNTER — Other Ambulatory Visit: Payer: Self-pay | Admitting: Family Medicine

## 2013-09-08 ENCOUNTER — Ambulatory Visit: Payer: BC Managed Care – PPO | Admitting: Pharmacist

## 2013-09-08 ENCOUNTER — Ambulatory Visit (INDEPENDENT_AMBULATORY_CARE_PROVIDER_SITE_OTHER): Payer: BC Managed Care – PPO | Admitting: Pharmacist

## 2013-09-08 VITALS — Wt 151.0 lb

## 2013-09-08 DIAGNOSIS — Z79899 Other long term (current) drug therapy: Secondary | ICD-10-CM

## 2013-09-08 DIAGNOSIS — E785 Hyperlipidemia, unspecified: Secondary | ICD-10-CM

## 2013-09-08 MED ORDER — EZETIMIBE 10 MG PO TABS: 10.0000 mg | ORAL_TABLET | Freq: Every day | ORAL | Status: DC

## 2013-09-08 NOTE — Assessment & Plan Note (Addendum)
Given patient's family h/o CAD, and LDL-P number of 2591 on statin therapy, will be more aggressive with LDL lowering therapy.  Patient wants off niacin due to constant flushing and hives despite taking it correctly.  Patient agreeable to try Zetia again since the GI bloating she had in the past was mild.  Will try her on Zetia 5 mg to start with since similar potency to 10 mg but less side effects.  Continue Lipitor 80 mg qd.  Will continue weight loss efforts as well.  Recheck NMR/LFTs in 3 months. Plan: 1.  Stop niacin today. 2.  Start Zetia 10 mg pills - take 1/2 tablet once daily. 3.  Continue lipitor 80 mg once daily. 4.  Will recheck NMR lipoProfile / hepatic panel in 3 months (11/29/13 - fasting labs, show up anytime after 7:30 am), and f/u a few day later to review (12/01/13 at 9:30 am)

## 2013-09-08 NOTE — Patient Instructions (Signed)
1.  Stop niacin today. 2.  Start Zetia 10 mg pills - take 1/2 tablet once daily. 3.  Continue lipitor 80 mg once daily. 4.  Will recheck NMR lipoProfile / hepatic panel in 3 months (11/29/13 - fasting labs, show up anytime after 7:30 am), and f/u a few day later to review (12/01/13 at 9:30 am)

## 2013-09-08 NOTE — Progress Notes (Signed)
Patient is a pleasant 48 y.o. WF her for follow up of her hyperlipidemia.  She is currently taking Atorvastatin 80 mg once daily and Niacin 1000 mg qday, and states that the flushing has become worse despite taking niacin correctly, and would like to get off it.  She is tolerating lipitor 80 mg well.  Patient had some muscle aches in the past on a combination of lipitor + fibrate, and both were stopped.  It was felt to be due to fibrate she tells me, so the lipitor was restarted, and since then she has tolerated Lipitor 80 mg qd well.  Did take Crestor in past, and unsure if this caused muscle aches or not.  She did try Zetia in the past, but tells me she had some mild bloating and stopped it.  She states the GI issue wasn't bad, and wouldn't be opposed to trying Zetia again if needed.  She has a h/o HTN, low HDL, and family history of early CAD.  Family history:  Father had a MI in his 66's (died in his 72's); Mother had h/o of hyperlipidemia, but no CAD. Social history:  Denies alcohol use, denies tobacco use. Exercise:  No regular exercise, but lives a relatively active lifestyle Diet:  This has improved over past 3 months.  Typically eats cereal for breakfast, a sandwich for lunch, and still eats out for dinner.  She doesn't eat fast food.  Drinks 1 diet soda once daily.  RF:  Family h/o early CAD (father had MI in his 24's), HTN, low HDL - LDL goal at least < 676, and prefer < 100; LDL-P goal at least < 1300 given such high baseline Meds:  Lipitor 80 mg qd, NIacin 1000 mg qd (will be stopping niacin today though) Intolerant:  Fenofibrate (muscle aches); possible problems with Crestor but not sure  Labs:  LDL-P number 2591, LDL 125, HDL 44, TG 168, TC 203, LFTs normal (Lipitor 80 mg qd, niacin 1000 mg/d)  Current Outpatient Prescriptions  Medication Sig Dispense Refill  . atorvastatin (LIPITOR) 80 MG tablet Take 1 tablet (80 mg total) by mouth daily.  90 tablet  3  . cetirizine (ZYRTEC) 10 MG  tablet Take 10 mg by mouth daily as needed.      . fluticasone (FLONASE) 50 MCG/ACT nasal spray Place 2 sprays into the nose daily as needed.      Marland Kitchen lisinopril (PRINIVIL,ZESTRIL) 10 MG tablet TAKE 1 TABLET BY MOUTH EVERY EVENING  90 tablet  0  . lisinopril-hydrochlorothiazide (PRINZIDE) 10-12.5 MG per tablet Take 1 tablet by mouth daily. In am  30 tablet  5  . lisinopril-hydrochlorothiazide (PRINZIDE,ZESTORETIC) 10-12.5 MG per tablet TAKE 1 TABLET BY MOUTH EVERY DAY  30 tablet  5  . niacin (NIASPAN) 500 MG CR tablet Take 2 tablets (1,000 mg total) by mouth at bedtime.  60 tablet  3  . niacin (NIASPAN) 500 MG CR tablet TAKE 2 TABLETS BY MOUTH EVERY NIGHT AT BEDTIME  60 tablet  0  . norgestrel-ethinyl estradiol (LO/OVRAL,CRYSELLE) 0.3-30 MG-MCG tablet Take 1 tablet by mouth daily.  1 Package  0  . omeprazole (PRILOSEC) 20 MG capsule Take 20 mg by mouth daily.      . psyllium (METAMUCIL) 58.6 % powder Take 1 packet by mouth daily.       No current facility-administered medications for this visit.   Allergies  Allergen Reactions  . Niacin And Related     Extreme flushing despite taking it correctly  .  Tetracyclines & Related Other (See Comments)    GI Issues.   Family History  Problem Relation Age of Onset  . Heart disease Father     Deceased  . Hyperlipidemia Mother   . Hyperlipidemia Maternal Grandmother   . Hypertension Father   . Hypertension Mother   . Lung cancer Father   . Melanoma Father     Eye Removal  . Diabetes Maternal Grandmother   . Hypertension Sister     x2  . Hyperlipidemia Sister     x2  . Dementia Maternal Grandmother

## 2013-10-31 ENCOUNTER — Other Ambulatory Visit: Payer: Self-pay | Admitting: Family Medicine

## 2013-11-29 ENCOUNTER — Other Ambulatory Visit (INDEPENDENT_AMBULATORY_CARE_PROVIDER_SITE_OTHER): Payer: BC Managed Care – PPO

## 2013-11-29 DIAGNOSIS — Z79899 Other long term (current) drug therapy: Secondary | ICD-10-CM

## 2013-11-29 DIAGNOSIS — E785 Hyperlipidemia, unspecified: Secondary | ICD-10-CM

## 2013-11-29 LAB — HEPATIC FUNCTION PANEL
ALT: 15 U/L (ref 0–35)
AST: 28 U/L (ref 0–37)
Albumin: 3.6 g/dL (ref 3.5–5.2)
Alkaline Phosphatase: 1 U/L — ABNORMAL LOW (ref 39–117)
BILIRUBIN TOTAL: 1.2 mg/dL (ref 0.2–1.2)
Bilirubin, Direct: 0.2 mg/dL (ref 0.0–0.3)
Total Protein: 7.1 g/dL (ref 6.0–8.3)

## 2013-12-01 ENCOUNTER — Ambulatory Visit (INDEPENDENT_AMBULATORY_CARE_PROVIDER_SITE_OTHER): Payer: BC Managed Care – PPO | Admitting: Pharmacist

## 2013-12-01 VITALS — Wt 148.0 lb

## 2013-12-01 DIAGNOSIS — E785 Hyperlipidemia, unspecified: Secondary | ICD-10-CM

## 2013-12-01 LAB — NMR LIPOPROFILE WITH LIPIDS
Cholesterol, Total: 185 mg/dL (ref 100–199)
HDL Particle Number: 32.7 umol/L (ref >=30.5)
HDL Size: 8.6 nm — ABNORMAL LOW (ref >=9.2)
HDL-C: 37 mg/dL — ABNORMAL LOW (ref >39)
LARGE HDL: 3.1 umol/L — AB (ref >=4.8)
LDL (calc): 116 mg/dL — ABNORMAL HIGH (ref 0–99)
LDL Particle Number: 1962 nmol/L — ABNORMAL HIGH (ref <1000)
LDL Size: 21 nm (ref >=20.8)
LP-IR Score: 64 — ABNORMAL HIGH (ref <=45)
Large VLDL-P: 2.9 nmol/L — ABNORMAL HIGH (ref <=2.7)
SMALL LDL PARTICLE NUMBER: 1046 nmol/L — AB (ref <=527)
TRIGLYCERIDES: 160 mg/dL — AB (ref 0–149)
VLDL Size: 44.1 nm (ref <=46.6)

## 2013-12-01 NOTE — Progress Notes (Signed)
Patient is a pleasant 48 y.o. WF her for follow up of her hyperlipidemia.  She is currently taking Atorvastatin 80 mg once daily and Zetia 10 mg qd, and tolerating this regimen well.  She started Zetia back in 08/2013 at dose of 5 mg qd, and approximately two weeks ago increased to 10 mg qd as she never had GI upset with lower dose.  Tolerating Zetia 10 mg qd well.  Patient had some muscle aches in the past on fibrate.  Had severe flushing with niacin.  Did take Crestor in past, and unsure if this caused muscle aches or not.  She has a h/o HTN, low HDL, and family history of early CAD.  Family history:  Father had a MI in his 26's (died in his 2's); Mother had h/o of hyperlipidemia, but no CAD. Social history:  Denies alcohol use, denies tobacco use. Exercise:  No regular exercise, but lives a relatively active lifestyle.  Has a FitBit at home so could count steps if needed. Diet:  Recently started doing Nutrisystem diet for breakfast and lunch.  She still cooks at home for dinner for the family and herself. She doesn't eat fast food.  Drinks 1 diet soda once daily.  Has lost 3 lbs over past few months.  RF:  Family h/o early CAD (father had MI in his 13's), HTN, low HDL - LDL goal at least < 409, and prefer < 100; LDL-P goal at least < 1300 given such high baseline Meds:  Lipitor 80 mg qd, Zetia 10 mg qd (increased from 5 mg qd up to 10 mg qd 11/15/13) Intolerant:  Fenofibrate (muscle aches); possible problems with Crestor but not sure  Labs:   11/2013:  LDL-P number 1962, LDL 116, HDL 37, TG 160, TC 185, LFTs okay (Lipitor 80 mg qd + Zetia 10 mg qd) 08/2013:  LDL-P number 2591, LDL 125, HDL 44, TG 168, TC 203, LFTs normal (Lipitor 80 mg qd, niacin 1000 mg/d)  Current Outpatient Prescriptions  Medication Sig Dispense Refill  . atorvastatin (LIPITOR) 80 MG tablet Take 1 tablet (80 mg total) by mouth daily.  90 tablet  3  . cetirizine (ZYRTEC) 10 MG tablet Take 10 mg by mouth daily as needed.      .  ezetimibe (ZETIA) 10 MG tablet Take 1 tablet (10 mg total) by mouth daily.  30 tablet  5  . fluticasone (FLONASE) 50 MCG/ACT nasal spray Place 2 sprays into the nose daily as needed.      Marland Kitchen lisinopril (PRINIVIL,ZESTRIL) 10 MG tablet TAKE 1 TABLET BY MOUTH EVERY EVENING  90 tablet  0  . lisinopril-hydrochlorothiazide (PRINZIDE) 10-12.5 MG per tablet Take 1 tablet by mouth daily. In am  30 tablet  5  . lisinopril-hydrochlorothiazide (PRINZIDE,ZESTORETIC) 10-12.5 MG per tablet TAKE 1 TABLET BY MOUTH EVERY DAY  30 tablet  5  . norgestrel-ethinyl estradiol (LO/OVRAL,CRYSELLE) 0.3-30 MG-MCG tablet Take 1 tablet by mouth daily.  1 Package  0  . omeprazole (PRILOSEC) 20 MG capsule Take 20 mg by mouth daily.      . psyllium (METAMUCIL) 58.6 % powder Take 1 packet by mouth daily.       No current facility-administered medications for this visit.   Allergies  Allergen Reactions  . Niacin And Related     Extreme flushing despite taking it correctly  . Tetracyclines & Related Other (See Comments)    GI Issues.   Family History  Problem Relation Age of Onset  . Heart  disease Father     Deceased  . Hyperlipidemia Mother   . Hyperlipidemia Maternal Grandmother   . Hypertension Father   . Hypertension Mother   . Lung cancer Father   . Melanoma Father     Eye Removal  . Diabetes Maternal Grandmother   . Hypertension Sister     x2  . Hyperlipidemia Sister     x2  . Dementia Maternal Grandmother

## 2013-12-01 NOTE — Patient Instructions (Signed)
1.  Continue atorvastatin 80 mg once daily. 2.  Continue Zetia 10 mg once daily. 3. Continue Nutrisystem diet and low fat meals at home. 4.  Start exercising 5 days per week - using FitBit if possible.  Target of 10,000 steps per day should be target at minimum. 5.  Recheck NMR LipoProfile and hepatic panel in 6 months with Dr. Abner Greenspan

## 2013-12-01 NOTE — Assessment & Plan Note (Signed)
Excellent reduction in LDL and LDL-P since stopping niacin, and adding Zetia to her atorvastatin 80 mg qd.  May get a little further LDL reduction from Zetia as she just increased to 10 mg dose two weeks ago.  Given her h/o constipation would not suggest bile acid sequestrant.  LDL is < 130, however LDL-P number is still above 1300 nmol/L.  Her new diet and weight loss can hopefully further lower this.  She is agreeable to start exercising a few days per week, and should try to get at least 10,000 steps per day on her FitBit.  Will recheck blood work with PCP in 6 months given she is on max dose statin + Zetia already in primary prevention patient.  She will call back if any problems or questions in the future.

## 2014-02-02 ENCOUNTER — Ambulatory Visit (INDEPENDENT_AMBULATORY_CARE_PROVIDER_SITE_OTHER): Payer: BC Managed Care – PPO | Admitting: Family Medicine

## 2014-02-02 ENCOUNTER — Encounter: Payer: Self-pay | Admitting: Family Medicine

## 2014-02-02 VITALS — BP 148/94 | HR 92 | Temp 99.5°F | Ht 64.0 in | Wt 155.8 lb

## 2014-02-02 DIAGNOSIS — I10 Essential (primary) hypertension: Secondary | ICD-10-CM | POA: Diagnosis not present

## 2014-02-02 DIAGNOSIS — E785 Hyperlipidemia, unspecified: Secondary | ICD-10-CM | POA: Diagnosis not present

## 2014-02-02 DIAGNOSIS — Z0000 Encounter for general adult medical examination without abnormal findings: Secondary | ICD-10-CM

## 2014-02-02 LAB — RENAL FUNCTION PANEL
ALBUMIN: 3.6 g/dL (ref 3.5–5.2)
BUN: 17 mg/dL (ref 6–23)
CO2: 26 mEq/L (ref 19–32)
Calcium: 9.2 mg/dL (ref 8.4–10.5)
Chloride: 104 mEq/L (ref 96–112)
Creatinine, Ser: 0.8 mg/dL (ref 0.4–1.2)
GFR: 79.99 mL/min (ref >60.00)
GLUCOSE: 99 mg/dL (ref 70–99)
PHOSPHORUS: 2.5 mg/dL (ref 2.3–4.6)
POTASSIUM: 3.7 meq/L (ref 3.5–5.1)
Sodium: 138 mEq/L (ref 135–145)

## 2014-02-02 LAB — LIPID PANEL
Cholesterol: 213 mg/dL — ABNORMAL HIGH (ref 0–200)
HDL: 40.3 mg/dL (ref >39.00)
NonHDL: 172.7
TRIGLYCERIDES: 206 mg/dL — AB (ref 0.0–149.0)
Total CHOL/HDL Ratio: 5
VLDL: 41.2 mg/dL — ABNORMAL HIGH (ref 0.0–40.0)

## 2014-02-02 LAB — HEPATIC FUNCTION PANEL
ALK PHOS: 58 U/L (ref 39–117)
ALT: 23 U/L (ref 0–35)
AST: 38 U/L — AB (ref 0–37)
Albumin: 3.6 g/dL (ref 3.5–5.2)
BILIRUBIN TOTAL: 0.9 mg/dL (ref 0.2–1.2)
Bilirubin, Direct: 0 mg/dL (ref 0.0–0.3)
Total Protein: 7.3 g/dL (ref 6.0–8.3)

## 2014-02-02 LAB — LDL CHOLESTEROL, DIRECT: Direct LDL: 148.5 mg/dL

## 2014-02-02 LAB — TSH: TSH: 1.08 u[IU]/mL (ref 0.35–4.50)

## 2014-02-02 LAB — CBC
HCT: 41.9 % (ref 36.0–46.0)
HEMOGLOBIN: 13.9 g/dL (ref 12.0–15.0)
MCHC: 33.2 g/dL (ref 30.0–36.0)
MCV: 88.5 fl (ref 78.0–100.0)
Platelets: 300 10*3/uL (ref 150.0–400.0)
RBC: 4.74 Mil/uL (ref 3.87–5.11)
RDW: 14 % (ref 11.5–15.5)
WBC: 8.4 10*3/uL (ref 4.0–10.5)

## 2014-02-02 MED ORDER — LISINOPRIL-HYDROCHLOROTHIAZIDE 10-12.5 MG PO TABS: 1.0000 | ORAL_TABLET | Freq: Every day | ORAL | Status: DC

## 2014-02-02 MED ORDER — LISINOPRIL 10 MG PO TABS: 10.0000 mg | ORAL_TABLET | Freq: Every evening | ORAL | Status: DC

## 2014-02-02 NOTE — Progress Notes (Signed)
Pre visit review using our clinic review tool, if applicable. No additional management support is needed unless otherwise documented below in the visit note. 

## 2014-02-02 NOTE — Patient Instructions (Signed)
Consider a probiotic daily such as [employer]age or [employer] [facility]  Preventive Care for Adults A healthy lifestyle and preventive care can promote health and wellness. Preventive health guidelines for women include the following key practices.  A routine yearly physical is a good way to check with your health care provider about your health and preventive screening. It is a chance to share any concerns and updates on your health and to receive a thorough exam.  Visit your dentist for a routine exam and preventive care every 6 months. Brush your teeth twice a day and floss once a day. Good oral hygiene prevents tooth decay and gum disease.  The frequency of eye exams is based on your age, health, family medical history, use of contact lenses, and other factors. Follow your health care provider's recommendations for frequency of eye exams.  Eat a healthy diet. Foods like vegetables, fruits, whole grains, low-fat dairy products, and lean protein foods contain the nutrients you need without too many calories. Decrease your intake of foods high in solid fats, added sugars, and salt. Eat the right amount of calories for you.Get information about a proper diet from your health care provider, if necessary.  Regular physical exercise is one of the most important things you can do for your health. Most adults should get at least 150 minutes of moderate-intensity exercise (any activity that increases your heart rate and causes you to sweat) each week. In addition, most adults need muscle-strengthening exercises on 2 or more days a week.  Maintain a healthy weight. The body mass index (BMI) is a screening tool to identify possible weight problems. It provides an estimate of body fat based on height and weight. Your health care provider can find your BMI and can help you achieve or maintain a healthy weight.For adults 20 years and older:  A BMI below 18.5 is considered underweight.  A BMI of  18.5 to 24.9 is normal.  A BMI of 25 to 29.9 is considered overweight.  A BMI of 30 and above is considered obese.  Maintain normal blood lipids and cholesterol levels by exercising and minimizing your intake of saturated fat. Eat a balanced diet with plenty of fruit and vegetables. Blood tests for lipids and cholesterol should begin at age 2 and be repeated every 5 years. If your lipid or cholesterol levels are high, you are over 50, or you are at high risk for heart disease, you may need your cholesterol levels checked more frequently.Ongoing high lipid and cholesterol levels should be treated with medicines if diet and exercise are not working.  If you smoke, find out from your health care provider how to quit. If you do not use tobacco, do not start.  Lung cancer screening is recommended for adults aged 59-80 years who are at high risk for developing lung cancer because of a history of smoking. A yearly low-dose CT scan of the lungs is recommended for people who have at least a 30-pack-year history of smoking and are a current smoker or have quit within the past 15 years. A pack year of smoking is smoking an average of 1 pack of cigarettes a day for 1 year (for example: 1 pack a day for 30 years or 2 packs a day for 15 years). Yearly screening should continue until the smoker has stopped smoking for at least 15 years. Yearly screening should be stopped for people who develop a health problem that would prevent them from having lung cancer treatment.  If you are pregnant, do not drink alcohol. If you are breastfeeding, be very cautious about drinking alcohol. If you are not pregnant and choose to drink alcohol, do not have more than 1 drink per day. One drink is considered to be 12 ounces (355 mL) of beer, 5 ounces (148 mL) of wine, or 1.5 ounces (44 mL) of liquor.  Avoid use of street drugs. Do not share needles with anyone. Ask for help if you need support or instructions about stopping the use  of drugs.  High blood pressure causes heart disease and increases the risk of stroke. Your blood pressure should be checked at least every 1 to 2 years. Ongoing high blood pressure should be treated with medicines if weight loss and exercise do not work.  If you are 8-78 years old, ask your health care provider if you should take aspirin to prevent strokes.  Diabetes screening involves taking a blood sample to check your fasting blood sugar level. This should be done once every 3 years, after age 96, if you are within normal weight and without risk factors for diabetes. Testing should be considered at a younger age or be carried out more frequently if you are overweight and have at least 1 risk factor for diabetes.  Breast cancer screening is essential preventive care for women. You should practice "breast self-awareness." This means understanding the normal appearance and feel of your breasts and may include breast self-examination. Any changes detected, no matter how small, should be reported to a health care provider. Women in their 34s and 30s should have a clinical breast exam (CBE) by a health care provider as part of a regular health exam every 1 to 3 years. After age 47, women should have a CBE every year. Starting at age 44, women should consider having a mammogram (breast X-ray test) every year. Women who have a family history of breast cancer should talk to their health care provider about genetic screening. Women at a high risk of breast cancer should talk to their health care providers about having an MRI and a mammogram every year.  Breast cancer gene (BRCA)-related cancer risk assessment is recommended for women who have family members with BRCA-related cancers. BRCA-related cancers include breast, ovarian, tubal, and peritoneal cancers. Having family members with these cancers may be associated with an increased risk for harmful changes (mutations) in the breast cancer genes BRCA1 and  BRCA2. Results of the assessment will determine the need for genetic counseling and BRCA1 and BRCA2 testing.  Routine pelvic exams to screen for cancer are no longer recommended for nonpregnant women who are considered low risk for cancer of the pelvic organs (ovaries, uterus, and vagina) and who do not have symptoms. Ask your health care provider if a screening pelvic exam is right for you.  If you have had past treatment for cervical cancer or a condition that could lead to cancer, you need Pap tests and screening for cancer for at least 20 years after your treatment. If Pap tests have been discontinued, your risk factors (such as having a new sexual partner) need to be reassessed to determine if screening should be resumed. Some women have medical problems that increase the chance of getting cervical cancer. In these cases, your health care provider may recommend more frequent screening and Pap tests.  The HPV test is an additional test that may be used for cervical cancer screening. The HPV test looks for the virus that can cause the cell changes  on the cervix. The cells collected during the Pap test can be tested for HPV. The HPV test could be used to screen women aged 32 years and older, and should be used in women of any age who have unclear Pap test results. After the age of 43, women should have HPV testing at the same frequency as a Pap test.  Colorectal cancer can be detected and often prevented. Most routine colorectal cancer screening begins at the age of 55 years and continues through age 24 years. However, your health care provider may recommend screening at an earlier age if you have risk factors for colon cancer. On a yearly basis, your health care provider may provide home test kits to check for hidden blood in the stool. Use of a small camera at the end of a tube, to directly examine the colon (sigmoidoscopy or colonoscopy), can detect the earliest forms of colorectal cancer. Talk to your  health care provider about this at age 7, when routine screening begins. Direct exam of the colon should be repeated every 5-10 years through age 77 years, unless early forms of pre-cancerous polyps or small growths are found.  People who are at an increased risk for hepatitis B should be screened for this virus. You are considered at high risk for hepatitis B if:  You were born in a country where hepatitis B occurs often. Talk with your health care provider about which countries are considered high risk.  Your parents were born in a high-risk country and you have not received a shot to protect against hepatitis B (hepatitis B vaccine).  You have HIV or AIDS.  You use needles to inject street drugs.  You live with, or have sex with, someone who has hepatitis B.  You get hemodialysis treatment.  You take certain medicines for conditions like cancer, organ transplantation, and autoimmune conditions.  Hepatitis C blood testing is recommended for all people born from [birth year] through 1965 and any individual with known risks for hepatitis C.  Practice safe sex. Use condoms and avoid high-risk sexual practices to reduce the spread of sexually transmitted infections (STIs). STIs include gonorrhea, chlamydia, syphilis, trichomonas, herpes, HPV, and human immunodeficiency virus (HIV). Herpes, HIV, and HPV are viral illnesses that have no cure. They can result in disability, cancer, and death.  You should be screened for sexually transmitted illnesses (STIs) including gonorrhea and chlamydia if:  You are sexually active and are younger than 24 years.  You are older than 24 years and your health care provider tells you that you are at risk for this type of infection.  Your sexual activity has changed since you were last screened and you are at an increased risk for chlamydia or gonorrhea. Ask your health care provider if you are at risk.  If you are at risk of being infected with HIV, it is  recommended that you take a prescription medicine daily to prevent HIV infection. This is called preexposure prophylaxis (PrEP). You are considered at risk if:  You are a heterosexual woman, are sexually active, and are at increased risk for HIV infection.  You take drugs by injection.  You are sexually active with a partner who has HIV.  Talk with your health care provider about whether you are at high risk of being infected with HIV. If you choose to begin PrEP, you should first be tested for HIV. You should then be tested every 3 months for as long as you are taking PrEP.  Osteoporosis is a disease in which the bones lose minerals and strength with aging. This can result in serious bone fractures or breaks. The risk of osteoporosis can be identified using a bone density scan. Women ages 72 years and over and women at risk for fractures or osteoporosis should discuss screening with their health care providers. Ask your health care provider whether you should take a calcium supplement or vitamin D to reduce the rate of osteoporosis.  Menopause can be associated with physical symptoms and risks. Hormone replacement therapy is available to decrease symptoms and risks. You should talk to your health care provider about whether hormone replacement therapy is right for you.  Use sunscreen. Apply sunscreen liberally and repeatedly throughout the day. You should seek shade when your shadow is shorter than you. Protect yourself by wearing long sleeves, pants, a wide-brimmed hat, and sunglasses year round, whenever you are outdoors.  Once a month, do a whole body skin exam, using a mirror to look at the skin on your back. Tell your health care provider of new moles, moles that have irregular borders, moles that are larger than a pencil eraser, or moles that have changed in shape or color.  Stay current with required vaccines (immunizations).  Influenza vaccine. All adults should be immunized every  year.  Tetanus, diphtheria, and acellular pertussis (Td, Tdap) vaccine. Pregnant women should receive 1 dose of Tdap vaccine during each pregnancy. The dose should be obtained regardless of the length of time since the last dose. Immunization is preferred during the 27th-36th week of gestation. An adult who has not previously received Tdap or who does not know her vaccine status should receive 1 dose of Tdap. This initial dose should be followed by tetanus and diphtheria toxoids (Td) booster doses every 10 years. Adults with an unknown or incomplete history of completing a 3-dose immunization series with Td-containing vaccines should begin or complete a primary immunization series including a Tdap dose. Adults should receive a Td booster every 10 years.  Varicella vaccine. An adult without evidence of immunity to varicella should receive 2 doses or a second dose if she has previously received 1 dose. Pregnant females who do not have evidence of immunity should receive the first dose after pregnancy. This first dose should be obtained before leaving the health care facility. The second dose should be obtained 4-8 weeks after the first dose.  Human papillomavirus (HPV) vaccine. Females aged 13-26 years who have not received the vaccine previously should obtain the 3-dose series. The vaccine is not recommended for use in pregnant females. However, pregnancy testing is not needed before receiving a dose. If a female is found to be pregnant after receiving a dose, no treatment is needed. In that case, the remaining doses should be delayed until after the pregnancy. Immunization is recommended for any person with an immunocompromised condition through the age of 79 years if she did not get any or all doses earlier. During the 3-dose series, the second dose should be obtained 4-8 weeks after the first dose. The third dose should be obtained 24 weeks after the first dose and 16 weeks after the second dose.  Zoster  vaccine. One dose is recommended for adults aged 34 years or older unless certain conditions are present.  Measles, mumps, and rubella (MMR) vaccine. Adults born before 75 generally are considered immune to measles and mumps. Adults born in [birth year] or later should have 1 or more doses of MMR vaccine unless there is a  contraindication to the vaccine or there is laboratory evidence of immunity to each of the three diseases. A routine second dose of MMR vaccine should be obtained at least 28 days after the first dose for students attending postsecondary schools, health care workers, or international travelers. People who received inactivated measles vaccine or an unknown type of measles vaccine during 1963-1967 should receive 2 doses of MMR vaccine. People who received inactivated mumps vaccine or an unknown type of mumps vaccine before 1979 and are at high risk for mumps infection should consider immunization with 2 doses of MMR vaccine. For females of childbearing age, rubella immunity should be determined. If there is no evidence of immunity, females who are not pregnant should be vaccinated. If there is no evidence of immunity, females who are pregnant should delay immunization until after pregnancy. Unvaccinated health care workers born before [birth year] who lack laboratory evidence of measles, mumps, or rubella immunity or laboratory confirmation of disease should consider measles and mumps immunization with 2 doses of MMR vaccine or rubella immunization with 1 dose of MMR vaccine.  Pneumococcal 13-valent conjugate (PCV13) vaccine. When indicated, a person who is uncertain of her immunization history and has no record of immunization should receive the PCV13 vaccine. An adult aged 35 years or older who has certain medical conditions and has not been previously immunized should receive 1 dose of PCV13 vaccine. This PCV13 should be followed with a dose of pneumococcal polysaccharide (PPSV23) vaccine. The PPSV23  vaccine dose should be obtained at least 8 weeks after the dose of PCV13 vaccine. An adult aged 84 years or older who has certain medical conditions and previously received 1 or more doses of PPSV23 vaccine should receive 1 dose of PCV13. The PCV13 vaccine dose should be obtained 1 or more years after the last PPSV23 vaccine dose.  Pneumococcal polysaccharide (PPSV23) vaccine. When PCV13 is also indicated, PCV13 should be obtained first. All adults aged 96 years and older should be immunized. An adult younger than age 41 years who has certain medical conditions should be immunized. Any person who resides in a nursing home or long-term care facility should be immunized. An adult smoker should be immunized. People with an immunocompromised condition and certain other conditions should receive both PCV13 and PPSV23 vaccines. People with human immunodeficiency virus (HIV) infection should be immunized as soon as possible after diagnosis. Immunization during chemotherapy or radiation therapy should be avoided. Routine use of PPSV23 vaccine is not recommended for American Indians, [place] Natives, or people younger than 65 years unless there are medical conditions that require PPSV23 vaccine. When indicated, people who have unknown immunization and have no record of immunization should receive PPSV23 vaccine. One-time revaccination 5 years after the first dose of PPSV23 is recommended for people aged 19-64 years who have chronic kidney failure, nephrotic syndrome, asplenia, or immunocompromised conditions. People who received 1-2 doses of PPSV23 before age 36 years should receive another dose of PPSV23 vaccine at age 59 years or later if at least 5 years have passed since the previous dose. Doses of PPSV23 are not needed for people immunized with PPSV23 at or after age 26 years.  Meningococcal vaccine. Adults with asplenia or persistent complement component deficiencies should receive 2 doses of quadrivalent  meningococcal conjugate (MenACWY-D) vaccine. The doses should be obtained at least 2 months apart. Microbiologists working with certain meningococcal bacteria, [place] recruits, people at risk during an outbreak, and people who travel to or live in countries with a high rate  of meningitis should be immunized. A first-year college student up through age 49 years who is living in a residence hall should receive a dose if she did not receive a dose on or after her 16th birthday. Adults who have certain high-risk conditions should receive one or more doses of vaccine.  Hepatitis A vaccine. Adults who wish to be protected from this disease, have certain high-risk conditions, work with hepatitis A-infected animals, work in hepatitis A research labs, or travel to or work in countries with a high rate of hepatitis A should be immunized. Adults who were previously unvaccinated and who anticipate close contact with an international adoptee during the first 60 days after arrival in the [place] from a country with a high rate of hepatitis A should be immunized.  Hepatitis B vaccine. Adults who wish to be protected from this disease, have certain high-risk conditions, may be exposed to blood or other infectious body fluids, are household contacts or sex partners of hepatitis B positive people, are clients or workers in certain care facilities, or travel to or work in countries with a high rate of hepatitis B should be immunized.  Haemophilus influenzae type b (Hib) vaccine. A previously unvaccinated person with asplenia or sickle cell disease or having a scheduled splenectomy should receive 1 dose of Hib vaccine. Regardless of previous immunization, a recipient of a hematopoietic stem cell transplant should receive a 3-dose series 6-12 months after her successful transplant. Hib vaccine is not recommended for adults with HIV infection. Preventive Services / Frequency Ages 23 to 16 years  Blood pressure check.**  / Every 1 to 2 years.  Lipid and cholesterol check.** / Every 5 years beginning at age 24.  Clinical breast exam.** / Every 3 years for women in their 100s and 47s.  BRCA-related cancer risk assessment.** / For women who have family members with a BRCA-related cancer (breast, ovarian, tubal, or peritoneal cancers).  Pap test.** / Every 2 years from ages 62 through 55. Every 3 years starting at age 67 through age 3 or 52 with a history of 3 consecutive normal Pap tests.  HPV screening.** / Every 3 years from ages 40 through ages 77 to 26 with a history of 3 consecutive normal Pap tests.  Hepatitis C blood test.** / For any individual with known risks for hepatitis C.  Skin self-exam. / Monthly.  Influenza vaccine. / Every year.  Tetanus, diphtheria, and acellular pertussis (Tdap, Td) vaccine.** / Consult your health care provider. Pregnant women should receive 1 dose of Tdap vaccine during each pregnancy. 1 dose of Td every 10 years.  Varicella vaccine.** / Consult your health care provider. Pregnant females who do not have evidence of immunity should receive the first dose after pregnancy.  HPV vaccine. / 3 doses over 6 months, if 25 and younger. The vaccine is not recommended for use in pregnant females. However, pregnancy testing is not needed before receiving a dose.  Measles, mumps, rubella (MMR) vaccine.** / You need at least 1 dose of MMR if you were born in 1957 or later. You may also need a 2nd dose. For females of childbearing age, rubella immunity should be determined. If there is no evidence of immunity, females who are not pregnant should be vaccinated. If there is no evidence of immunity, females who are pregnant should delay immunization until after pregnancy.  Pneumococcal 13-valent conjugate (PCV13) vaccine.** / Consult your health care provider.  Pneumococcal polysaccharide (PPSV23) vaccine.** / 1 to 2 doses if  you smoke cigarettes or if you have certain  conditions.  Meningococcal vaccine.** / 1 dose if you are age 85 to 71 years and a Market researcher living in a residence hall, or have one of several medical conditions, you need to get vaccinated against meningococcal disease. You may also need additional booster doses.  Hepatitis A vaccine.** / Consult your health care provider.  Hepatitis B vaccine.** / Consult your health care provider.  Haemophilus influenzae type b (Hib) vaccine.** / Consult your health care provider. Ages 74 to 10 years  Blood pressure check.** / Every 1 to 2 years.  Lipid and cholesterol check.** / Every 5 years beginning at age 42 years.  Lung cancer screening. / Every year if you are aged 50-80 years and have a 30-pack-year history of smoking and currently smoke or have quit within the past 15 years. Yearly screening is stopped once you have quit smoking for at least 15 years or develop a health problem that would prevent you from having lung cancer treatment.  Clinical breast exam.** / Every year after age 75 years.  BRCA-related cancer risk assessment.** / For women who have family members with a BRCA-related cancer (breast, ovarian, tubal, or peritoneal cancers).  Mammogram.** / Every year beginning at age 20 years and continuing for as long as you are in good health. Consult with your health care provider.  Pap test.** / Every 3 years starting at age 25 years through age 41 or 46 years with a history of 3 consecutive normal Pap tests.  HPV screening.** / Every 3 years from ages 58 years through ages 11 to 40 years with a history of 3 consecutive normal Pap tests.  Fecal occult blood test (FOBT) of stool. / Every year beginning at age 55 years and continuing until age 52 years. You may not need to do this test if you get a colonoscopy every 10 years.  Flexible sigmoidoscopy or colonoscopy.** / Every 5 years for a flexible sigmoidoscopy or every 10 years for a colonoscopy beginning at age 73 years  and continuing until age 28 years.  Hepatitis C blood test.** / For all people born from [birth year] through 1965 and any individual with known risks for hepatitis C.  Skin self-exam. / Monthly.  Influenza vaccine. / Every year.  Tetanus, diphtheria, and acellular pertussis (Tdap/Td) vaccine.** / Consult your health care provider. Pregnant women should receive 1 dose of Tdap vaccine during each pregnancy. 1 dose of Td every 10 years.  Varicella vaccine.** / Consult your health care provider. Pregnant females who do not have evidence of immunity should receive the first dose after pregnancy.  Zoster vaccine.** / 1 dose for adults aged 42 years or older.  Measles, mumps, rubella (MMR) vaccine.** / You need at least 1 dose of MMR if you were born in 1957 or later. You may also need a 2nd dose. For females of childbearing age, rubella immunity should be determined. If there is no evidence of immunity, females who are not pregnant should be vaccinated. If there is no evidence of immunity, females who are pregnant should delay immunization until after pregnancy.  Pneumococcal 13-valent conjugate (PCV13) vaccine.** / Consult your health care provider.  Pneumococcal polysaccharide (PPSV23) vaccine.** / 1 to 2 doses if you smoke cigarettes or if you have certain conditions.  Meningococcal vaccine.** / Consult your health care provider.  Hepatitis A vaccine.** / Consult your health care provider.  Hepatitis B vaccine.** / Consult your health care provider.  Haemophilus  influenzae type b (Hib) vaccine.** / Consult your health care provider. Ages 32 years and over  Blood pressure check.** / Every 1 to 2 years.  Lipid and cholesterol check.** / Every 5 years beginning at age 38 years.  Lung cancer screening. / Every year if you are aged 56-80 years and have a 30-pack-year history of smoking and currently smoke or have quit within the past 15 years. Yearly screening is stopped once you have quit smoking  for at least 15 years or develop a health problem that would prevent you from having lung cancer treatment.  Clinical breast exam.** / Every year after age 15 years.  BRCA-related cancer risk assessment.** / For women who have family members with a BRCA-related cancer (breast, ovarian, tubal, or peritoneal cancers).  Mammogram.** / Every year beginning at age 48 years and continuing for as long as you are in good health. Consult with your health care provider.  Pap test.** / Every 3 years starting at age 67 years through age 73 or 57 years with 3 consecutive normal Pap tests. Testing can be stopped between 65 and 70 years with 3 consecutive normal Pap tests and no abnormal Pap or HPV tests in the past 10 years.  HPV screening.** / Every 3 years from ages 58 years through ages 88 or 89 years with a history of 3 consecutive normal Pap tests. Testing can be stopped between 65 and 70 years with 3 consecutive normal Pap tests and no abnormal Pap or HPV tests in the past 10 years.  Fecal occult blood test (FOBT) of stool. / Every year beginning at age 38 years and continuing until age 66 years. You may not need to do this test if you get a colonoscopy every 10 years.  Flexible sigmoidoscopy or colonoscopy.** / Every 5 years for a flexible sigmoidoscopy or every 10 years for a colonoscopy beginning at age 41 years and continuing until age 44 years.  Hepatitis C blood test.** / For all people born from [birth year] through 1965 and any individual with known risks for hepatitis C.  Osteoporosis screening.** / A one-time screening for women ages 51 years and over and women at risk for fractures or osteoporosis.  Skin self-exam. / Monthly.  Influenza vaccine. / Every year.  Tetanus, diphtheria, and acellular pertussis (Tdap/Td) vaccine.** / 1 dose of Td every 10 years.  Varicella vaccine.** / Consult your health care provider.  Zoster vaccine.** / 1 dose for adults aged 61 years or older.  Pneumococcal  13-valent conjugate (PCV13) vaccine.** / Consult your health care provider.  Pneumococcal polysaccharide (PPSV23) vaccine.** / 1 dose for all adults aged 23 years and older.  Meningococcal vaccine.** / Consult your health care provider.  Hepatitis A vaccine.** / Consult your health care provider.  Hepatitis B vaccine.** / Consult your health care provider.  Haemophilus influenzae type b (Hib) vaccine.** / Consult your health care provider. ** Family history and personal history of risk and conditions may change your health care provider's recommendations. Document Released: 04/29/2001 Document Revised: 07/18/2013 Document Reviewed: 07/29/2010 [facility] Patient Information 2015 [place], [place]. This information is not intended to replace advice given to you by your health care provider. Make sure you discuss any questions you have with your health care provider.

## 2014-02-05 NOTE — Assessment & Plan Note (Signed)
Was improved somewhat on recheck but has been out of his meds. Well controlled, no changes to meds. Encouraged heart healthy diet such as the DASH diet and exercise as tolerated.

## 2014-02-05 NOTE — Progress Notes (Signed)
Marissa Guzman  [id number] [birth year] 02/05/2014      Progress Note-Follow Up  Subjective  Chief Complaint  Chief Complaint  Patient presents with  . Annual Exam    physical    HPI  Patient is a 48 y.o. female in today for routine medical care. Patient has had a recent URI but it is improved now. No further congestion or cough. Follows with Dr Carlis Abbott of GYN. Denies CP/palp/SOB/HA/congestion/fevers/GI or GU c/o. Taking meds as prescribed  Past Medical History  Diagnosis Date  . HTN (hypertension)   . Hyperlipidemia   . Seasonal allergies   . Acute bronchitis 05/16/2012  . Allergic state 05/16/2012  . Hypokalemia 11/14/2012  . GERD (gastroesophageal reflux disease) 06/12/2013    Past Surgical History  Procedure Laterality Date  . Hemorrhoid surgery      two  . Cesarean section      Family History  Problem Relation Age of Onset  . Heart disease Father     Deceased  . Hypertension Father   . Lung cancer Father   . Melanoma Father     Eye Removal  . Hyperlipidemia Mother   . Hypertension Mother   . Hyperlipidemia Maternal Grandmother   . Diabetes Maternal Grandmother   . Dementia Maternal Grandmother   . Hypertension Sister     x2  . Hyperlipidemia Sister     x2    History   Social History  . Marital Status: Married    Spouse Name: N/A    Number of Children: N/A  . Years of Education: N/A   Occupational History  . Not on file.   Social History Main Topics  . Smoking status: Never Smoker   . Smokeless tobacco: Not on file  . Alcohol Use: No  . Drug Use: No  . 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 Not on file   Social History Narrative    Current Outpatient Prescriptions on File Prior to Visit  Medication Sig Dispense Refill  . atorvastatin (LIPITOR) 80 MG tablet Take 1 tablet (80 mg total) by mouth daily. 90 tablet  3  . cetirizine (ZYRTEC) 10 MG tablet Take 10 mg by mouth daily as needed.    . ezetimibe (ZETIA) 10 MG tablet Take 1 tablet (10 mg total) by mouth daily. 30 tablet 5  . norgestrel-ethinyl estradiol (LO/OVRAL,CRYSELLE) 0.3-30 MG-MCG tablet Take 1 tablet by mouth daily. 1 Package 0  . omeprazole (PRILOSEC) 20 MG capsule Take 20 mg by mouth daily.    . psyllium (METAMUCIL) 58.6 % powder Take 1 packet by mouth daily.    . fluticasone (FLONASE) 50 MCG/ACT nasal spray Place 2 sprays into the nose daily as needed.     No current facility-administered medications on file prior to visit.    Allergies  Allergen Reactions  . Niacin And Related     Extreme flushing despite taking it correctly  . Tetracyclines & Related Other (See Comments)    GI Issues.    Review of Systems  Review of Systems  Constitutional: Negative for fever, chills and malaise/fatigue.  HENT: Negative for congestion, hearing loss and nosebleeds.   Eyes: Negative for discharge.  Respiratory: Negative for cough, sputum production, shortness of breath and wheezing.   Cardiovascular: Negative for chest pain, palpitations and leg swelling.  Gastrointestinal: Negative for heartburn, nausea, vomiting, abdominal  pain, diarrhea, constipation and blood in stool.  Genitourinary: Negative for dysuria, urgency, frequency and hematuria.  Musculoskeletal: Negative for myalgias, back pain and falls.  Skin: Negative for rash.  Neurological: Negative for dizziness, tremors, sensory change, focal weakness, loss of consciousness, weakness and headaches.  Endo/Heme/Allergies: Negative for polydipsia. Does not bruise/bleed easily.  Psychiatric/Behavioral: Negative for depression and suicidal ideas. The patient is not nervous/anxious and does not have insomnia.     Objective  BP 148/94 mmHg  Pulse 92  Temp(Src) 99.5 F (37.5 C) (Oral)  Ht 5\' 4"  (1.626 m)  Wt 155 lb 12.8 oz (70.67 kg)  BMI 26.73 kg/m2  SpO2 100%  Physical  Exam  Physical Exam  Constitutional: She is oriented to person, place, and time and well-developed, well-nourished, and in no distress. No distress.  HENT:  Head: Normocephalic and atraumatic.  Right Ear: External ear normal.  Left Ear: External ear normal.  Nose: Nose normal.  Mouth/Throat: Oropharynx is clear and moist. No oropharyngeal exudate.  Eyes: Conjunctivae are normal. Pupils are equal, round, and reactive to light. Right eye exhibits no discharge. Left eye exhibits no discharge. No scleral icterus.  Neck: Normal range of motion. Neck supple. No thyromegaly present.  Cardiovascular: Normal rate, regular rhythm, normal heart sounds and intact distal pulses.   No murmur heard. Pulmonary/Chest: Effort normal and breath sounds normal. No respiratory distress. She has no wheezes. She has no rales.  Abdominal: Soft. Bowel sounds are normal. She exhibits no distension and no mass. There is no tenderness.  Musculoskeletal: Normal range of motion. She exhibits no edema or tenderness.  Lymphadenopathy:    She has no cervical adenopathy.  Neurological: She is alert and oriented to person, place, and time. She has normal reflexes. No cranial nerve deficit. Coordination normal.  Skin: Skin is warm and dry. No rash noted. She is not diaphoretic.  Psychiatric: Mood, memory and affect normal.    Lab Results  Component Value Date   TSH 1.08 02/02/2014   Lab Results  Component Value Date   WBC 8.4 02/02/2014   HGB 13.9 02/02/2014   HCT 41.9 02/02/2014   MCV 88.5 02/02/2014   PLT 300.0 02/02/2014   Lab Results  Component Value Date   CREATININE 0.8 02/02/2014   BUN 17 02/02/2014   NA 138 02/02/2014   K 3.7 02/02/2014   CL 104 02/02/2014   CO2 26 02/02/2014   Lab Results  Component Value Date   ALT 23 02/02/2014   AST 38* 02/02/2014   ALKPHOS 58 02/02/2014   BILITOT 0.9 02/02/2014   Lab Results  Component Value Date   CHOL 213* 02/02/2014   Lab Results  Component Value  Date   HDL 40.30 02/02/2014   Lab Results  Component Value Date   LDLCALC 116* 11/29/2013   Lab Results  Component Value Date   TRIG 206.0* 02/02/2014   Lab Results  Component Value Date   CHOLHDL 5 02/02/2014     Assessment & Plan  Essential hypertension Was improved somewhat on recheck but has been out of his meds. Well controlled, no changes to meds. Encouraged heart healthy diet such as the DASH diet and exercise as tolerated.   Hyperlipidemia Tolerating statin, encouraged heart healthy diet, avoid trans fats, minimize simple carbs and saturated fats. Increase exercise as tolerated  Preventative health care Patient encouraged to maintain heart healthy diet, regular exercise, adequate sleep. Consider daily probiotics. Take medications as prescribed. Reviewed labs with patient. she declines flu shot

## 2014-02-05 NOTE — Assessment & Plan Note (Signed)
Tolerating statin, encouraged heart healthy diet, avoid trans fats, minimize simple carbs and saturated fats. Increase exercise as tolerated 

## 2014-02-05 NOTE — Assessment & Plan Note (Addendum)
Patient encouraged to maintain heart healthy diet, regular exercise, adequate sleep. Consider daily probiotics. Take medications as prescribed. Reviewed labs with patient. she declines flu shot

## 2014-04-10 ENCOUNTER — Encounter: Payer: Self-pay | Admitting: Family Medicine

## 2014-04-10 ENCOUNTER — Ambulatory Visit (INDEPENDENT_AMBULATORY_CARE_PROVIDER_SITE_OTHER): Payer: BLUE CROSS/BLUE SHIELD | Admitting: Family Medicine

## 2014-04-10 ENCOUNTER — Ambulatory Visit (HOSPITAL_BASED_OUTPATIENT_CLINIC_OR_DEPARTMENT_OTHER)
Admission: RE | Admit: 2014-04-10 | Discharge: 2014-04-10 | Disposition: A | Discharge status: Home / Self Care | Payer: BLUE CROSS/BLUE SHIELD | Source: Ambulatory Visit | Attending: Family Medicine | Admitting: Family Medicine

## 2014-04-10 VITALS — BP 120/82 | HR 94 | Temp 98.5°F | Ht 64.0 in | Wt 159.0 lb

## 2014-04-10 DIAGNOSIS — R109 Unspecified abdominal pain: Secondary | ICD-10-CM | POA: Diagnosis present

## 2014-04-10 DIAGNOSIS — I10 Essential (primary) hypertension: Secondary | ICD-10-CM

## 2014-04-10 DIAGNOSIS — K219 Gastro-esophageal reflux disease without esophagitis: Secondary | ICD-10-CM

## 2014-04-10 DIAGNOSIS — K5900 Constipation, unspecified: Secondary | ICD-10-CM

## 2014-04-10 LAB — COMPREHENSIVE METABOLIC PANEL
ALBUMIN: 4 g/dL (ref 3.5–5.2)
ALK PHOS: 68 U/L (ref 39–117)
ALT: 32 U/L (ref 0–35)
AST: 46 U/L — AB (ref 0–37)
BUN: 17 mg/dL (ref 6–23)
CO2: 23 meq/L (ref 19–32)
CREATININE: 0.91 mg/dL (ref 0.40–1.20)
Calcium: 9.3 mg/dL (ref 8.4–10.5)
Chloride: 101 mEq/L (ref 96–112)
GFR: 69.88 mL/min (ref >60.00)
Glucose, Bld: 81 mg/dL (ref 70–99)
Potassium: 3.7 mEq/L (ref 3.5–5.1)
SODIUM: 137 meq/L (ref 135–145)
Total Bilirubin: 1 mg/dL (ref 0.2–1.2)
Total Protein: 7.8 g/dL (ref 6.0–8.3)

## 2014-04-10 LAB — CBC
HEMATOCRIT: 44.9 % (ref 36.0–46.0)
HEMOGLOBIN: 15.2 g/dL — AB (ref 12.0–15.0)
MCHC: 33.8 g/dL (ref 30.0–36.0)
MCV: 87.3 fl (ref 78.0–100.0)
Platelets: 310 10*3/uL (ref 150.0–400.0)
RBC: 5.14 Mil/uL — ABNORMAL HIGH (ref 3.87–5.11)
RDW: 14.1 % (ref 11.5–15.5)
WBC: 9.1 10*3/uL (ref 4.0–10.5)

## 2014-04-10 LAB — LIPASE: Lipase: 17 U/L (ref 11.0–59.0)

## 2014-04-10 LAB — AMYLASE: AMYLASE: 53 U/L (ref 27–131)

## 2014-04-10 NOTE — Progress Notes (Signed)
Pre visit review using our clinic review tool, if applicable. No additional management support is needed unless otherwise documented below in the visit note. 

## 2014-04-10 NOTE — Patient Instructions (Signed)

## 2014-04-11 LAB — URINALYSIS
Bilirubin Urine: NEGATIVE
KETONES UR: NEGATIVE
Leukocytes, UA: NEGATIVE
Nitrite: NEGATIVE
PH: 6 (ref 5.0–8.0)
Specific Gravity, Urine: 1.02 (ref 1.000–1.030)
Total Protein, Urine: NEGATIVE
Urine Glucose: NEGATIVE
Urobilinogen, UA: 0.2 (ref 0.0–1.0)

## 2014-04-12 LAB — URINE CULTURE
COLONY COUNT: NO GROWTH
ORGANISM ID, BACTERIA: NO GROWTH

## 2014-04-16 ENCOUNTER — Encounter: Payer: Self-pay | Admitting: Family Medicine

## 2014-04-16 DIAGNOSIS — K5900 Constipation, unspecified: Secondary | ICD-10-CM | POA: Insufficient documentation

## 2014-04-16 HISTORY — DX: Constipation, unspecified: K59.00

## 2014-04-16 NOTE — Assessment & Plan Note (Signed)
Avoid offending foods, start probiotics. Do not eat large meals in late evening and consider raising head of bed.  

## 2014-04-16 NOTE — Assessment & Plan Note (Signed)
LUQ pain > RUQ. Bloating and gaseousness noted x 1 week. Trouble lying on right side, xray confirms constipation. Encouraged increased hydration and fiber in diet. Daily probiotics. If bowels not moving can use MOM 2 tbls po in 4 oz of warm prune juice by mouth every 2-3 days. If no results then repeat in 4 hours with  Dulcolax suppository pr, may repeat again in 4 more hours as needed. Seek care if symptoms worsen. Consider daily Miralax and/or Dulcolax if symptoms persist. If no improvement in symptoms will need to proceed with CT scan and further work up.

## 2014-04-16 NOTE — Assessment & Plan Note (Signed)
Improved on recheck. Well controlled, no changes to meds. Encouraged heart healthy diet such as the DASH diet and exercise as tolerated.  

## 2014-04-16 NOTE — Progress Notes (Signed)
Marissa Guzman  [id number] 1965-10-19 04/16/2014      Progress Note-Follow Up  Subjective  Chief Complaint  Chief Complaint  Patient presents with  . Pain in (L) side    x 1 week    HPI  Patient is a 49 y.o. female in today for routine medical care. Patient in today with persistent abdomin LUQ pain > RUQ. Bloating and gaseousness noted x 1 week. Trouble lying on right side. No bloody or tarry stool, reports brown BM daily. Denies anorexia, f/c, n/v. Acknowledges some dyspepsia and bloating. Denies CP/palp/SOB/HA/congestion/fevers GU c/o. Taking meds as prescribed  Past Medical History  Diagnosis Date  . HTN (hypertension)   . Hyperlipidemia   . Seasonal allergies   . Acute bronchitis 05/16/2012  . Allergic state 05/16/2012  . Hypokalemia 11/14/2012  . GERD (gastroesophageal reflux disease) 06/12/2013  . Constipation 04/16/2014    Past Surgical History  Procedure Laterality Date  . Hemorrhoid surgery      two  . Cesarean section      Family History  Problem Relation Age of Onset  . Heart disease Father     Deceased  . Hypertension Father   . Lung cancer Father   . Melanoma Father     Eye Removal  . Hyperlipidemia Mother   . Hypertension Mother   . Hyperlipidemia Maternal Grandmother   . Diabetes Maternal Grandmother   . Dementia Maternal Grandmother   . Hypertension Sister     x2  . Hyperlipidemia Sister     x2    History   Social History  . Marital Status: Married    Spouse Name: N/A    Number of Children: N/A  . Years of Education: N/A   Occupational History  . Not on file.   Social History Main Topics  . Smoking status: Never Smoker   . Smokeless tobacco: Not on file  . Alcohol Use: No  . Drug Use: No  . 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 Not on file   Social History Narrative    Current Outpatient Prescriptions  on File Prior to Visit  Medication Sig Dispense Refill  . atorvastatin (LIPITOR) 80 MG tablet Take 1 tablet (80 mg total) by mouth daily. 90 tablet 3  . cetirizine (ZYRTEC) 10 MG tablet Take 10 mg by mouth daily as needed.    . ezetimibe (ZETIA) 10 MG tablet Take 1 tablet (10 mg total) by mouth daily. 30 tablet 5  . fluticasone (FLONASE) 50 MCG/ACT nasal spray Place 2 sprays into the nose daily as needed.    Marland Kitchen lisinopril (PRINIVIL,ZESTRIL) 10 MG tablet Take 1 tablet (10 mg total) by mouth every evening. 90 tablet 3  . lisinopril-hydrochlorothiazide (PRINZIDE,ZESTORETIC) 10-12.5 MG per tablet Take 1 tablet by mouth daily. 90 tablet 3  . norgestrel-ethinyl estradiol (LO/OVRAL,CRYSELLE) 0.3-30 MG-MCG tablet Take 1 tablet by mouth daily. 1 Package 0  . omeprazole (PRILOSEC) 20 MG capsule Take 20 mg by mouth daily.    . psyllium (METAMUCIL) 58.6 % powder Take 1 packet by mouth daily.     No current facility-administered medications on file prior to visit.    Allergies  Allergen Reactions  . Niacin And Related     Extreme flushing despite taking it correctly  . Tetracyclines & Related Other (See Comments)    GI Issues.  Review of Systems  Review of Systems  Constitutional: Negative for fever and malaise/fatigue.  HENT: Negative for congestion.   Eyes: Negative for discharge.  Respiratory: Negative for shortness of breath.   Cardiovascular: Negative for chest pain, palpitations and leg swelling.  Gastrointestinal: Positive for heartburn and abdominal pain. Negative for nausea, diarrhea, blood in stool and melena.  Genitourinary: Negative for dysuria.  Musculoskeletal: Negative for falls.  Skin: Negative for rash.  Neurological: Negative for loss of consciousness and headaches.  Endo/Heme/Allergies: Negative for polydipsia.  Psychiatric/Behavioral: Negative for depression and suicidal ideas. The patient is not nervous/anxious and does not have insomnia.     Objective  BP 120/82  mmHg  Pulse 94  Temp(Src) 98.5 F (36.9 C) (Oral)  Ht 5\' 4"  (1.626 m)  Wt 159 lb (72.122 kg)  BMI 27.28 kg/m2  SpO2 96%  Physical Exam  Physical Exam  Constitutional: She is oriented to person, place, and time and well-developed, well-nourished, and in no distress. No distress.  HENT:  Head: Normocephalic and atraumatic.  Eyes: Conjunctivae are normal.  Neck: Neck supple. No thyromegaly present.  Cardiovascular: Normal rate, regular rhythm and normal heart sounds.   No murmur heard. Pulmonary/Chest: Effort normal and breath sounds normal. She has no wheezes.  Abdominal: Soft. Bowel sounds are normal. She exhibits no distension and no mass. There is tenderness. There is no rebound and no guarding.  Musculoskeletal: She exhibits no edema.  Lymphadenopathy:    She has no cervical adenopathy.  Neurological: She is alert and oriented to person, place, and time.  Skin: Skin is warm and dry. No rash noted. She is not diaphoretic.  Psychiatric: Memory, affect and judgment normal.    Lab Results  Component Value Date   TSH 1.08 02/02/2014   Lab Results  Component Value Date   WBC 9.1 04/10/2014   HGB 15.2* 04/10/2014   HCT 44.9 04/10/2014   MCV 87.3 04/10/2014   PLT 310.0 04/10/2014   Lab Results  Component Value Date   CREATININE 0.91 04/10/2014   BUN 17 04/10/2014   NA 137 04/10/2014   K 3.7 04/10/2014   CL 101 04/10/2014   CO2 23 04/10/2014   Lab Results  Component Value Date   ALT 32 04/10/2014   AST 46* 04/10/2014   ALKPHOS 68 04/10/2014   BILITOT 1.0 04/10/2014   Lab Results  Component Value Date   CHOL 213* 02/02/2014   Lab Results  Component Value Date   HDL 40.30 02/02/2014   Lab Results  Component Value Date   LDLCALC 116* 11/29/2013   Lab Results  Component Value Date   TRIG 206.0* 02/02/2014   Lab Results  Component Value Date   CHOLHDL 5 02/02/2014     Assessment & Plan  GERD (gastroesophageal reflux disease) Avoid offending foods,  start probiotics. Do not eat large meals in late evening and consider raising head of bed.    Essential hypertension Improved on recheck. Well controlled, no changes to meds. Encouraged heart healthy diet such as the DASH diet and exercise as tolerated.    Constipation LUQ pain > RUQ. Bloating and gaseousness noted x 1 week. Trouble lying on right side, xray confirms constipation. Encouraged increased hydration and fiber in diet. Daily probiotics. If bowels not moving can use MOM 2 tbls po in 4 oz of warm prune juice by mouth every 2-3 days. If no results then repeat in 4 hours with  Dulcolax suppository pr, may repeat again in 4 more hours  as needed. Seek care if symptoms worsen. Consider daily Miralax and/or Dulcolax if symptoms persist. If no improvement in symptoms will need to proceed with CT scan and further work up.

## 2014-04-17 ENCOUNTER — Ambulatory Visit (HOSPITAL_BASED_OUTPATIENT_CLINIC_OR_DEPARTMENT_OTHER): Payer: BLUE CROSS/BLUE SHIELD

## 2014-04-18 ENCOUNTER — Telehealth: Payer: Self-pay

## 2014-04-18 NOTE — Telephone Encounter (Signed)
Left message for patient to return the call. CT has been denied. If patient is not feeling any better, needs to be seen in office.

## 2014-04-18 NOTE — Telephone Encounter (Signed)
Patient returned phone call. Best # [phone]  She states that she is feeling better.

## 2014-04-19 NOTE — Telephone Encounter (Signed)
See below

## 2014-05-23 ENCOUNTER — Other Ambulatory Visit: Payer: Self-pay | Admitting: Family Medicine

## 2014-06-04 ENCOUNTER — Other Ambulatory Visit: Payer: Self-pay | Admitting: Family Medicine

## 2014-06-05 NOTE — Telephone Encounter (Signed)
Med filled.  

## 2014-08-03 ENCOUNTER — Telehealth: Payer: Self-pay | Admitting: Family Medicine

## 2014-08-03 ENCOUNTER — Encounter: Payer: Self-pay | Admitting: Family Medicine

## 2014-08-03 ENCOUNTER — Ambulatory Visit (INDEPENDENT_AMBULATORY_CARE_PROVIDER_SITE_OTHER): Payer: BLUE CROSS/BLUE SHIELD | Admitting: Family Medicine

## 2014-08-03 ENCOUNTER — Telehealth: Payer: Self-pay | Admitting: Lab

## 2014-08-03 VITALS — BP 120/86 | HR 108 | Temp 98.8°F | Ht 63.5 in | Wt 154.1 lb

## 2014-08-03 DIAGNOSIS — K760 Fatty (change of) liver, not elsewhere classified: Secondary | ICD-10-CM | POA: Diagnosis not present

## 2014-08-03 DIAGNOSIS — R000 Tachycardia, unspecified: Secondary | ICD-10-CM | POA: Diagnosis not present

## 2014-08-03 DIAGNOSIS — I10 Essential (primary) hypertension: Secondary | ICD-10-CM

## 2014-08-03 DIAGNOSIS — E876 Hypokalemia: Secondary | ICD-10-CM

## 2014-08-03 DIAGNOSIS — E785 Hyperlipidemia, unspecified: Secondary | ICD-10-CM

## 2014-08-03 DIAGNOSIS — K219 Gastro-esophageal reflux disease without esophagitis: Secondary | ICD-10-CM

## 2014-08-03 LAB — COMPREHENSIVE METABOLIC PANEL
ALT: 18 U/L (ref 0–35)
AST: 26 U/L (ref 0–37)
Albumin: 4 g/dL (ref 3.5–5.2)
Alkaline Phosphatase: 55 U/L (ref 39–117)
BUN: 16 mg/dL (ref 6–23)
CHLORIDE: 99 meq/L (ref 96–112)
CO2: 28 mEq/L (ref 19–32)
Calcium: 9.5 mg/dL (ref 8.4–10.5)
Creatinine, Ser: 0.92 mg/dL (ref 0.40–1.20)
GFR: 68.91 mL/min (ref >60.00)
Glucose, Bld: 116 mg/dL — ABNORMAL HIGH (ref 70–99)
Potassium: 2.8 mEq/L — CL (ref 3.5–5.1)
SODIUM: 136 meq/L (ref 135–145)
Total Bilirubin: 1 mg/dL (ref 0.2–1.2)
Total Protein: 7.2 g/dL (ref 6.0–8.3)

## 2014-08-03 LAB — TSH: TSH: 1.41 u[IU]/mL (ref 0.35–4.50)

## 2014-08-03 LAB — LIPID PANEL
CHOLESTEROL: 211 mg/dL — AB (ref 0–200)
HDL: 37.8 mg/dL — ABNORMAL LOW (ref >39.00)
NonHDL: 173.2
Total CHOL/HDL Ratio: 6
Triglycerides: 255 mg/dL — ABNORMAL HIGH (ref 0.0–149.0)
VLDL: 51 mg/dL — ABNORMAL HIGH (ref 0.0–40.0)

## 2014-08-03 LAB — CBC
HEMATOCRIT: 39.1 % (ref 36.0–46.0)
HEMOGLOBIN: 13.3 g/dL (ref 12.0–15.0)
MCHC: 34 g/dL (ref 30.0–36.0)
MCV: 88.2 fl (ref 78.0–100.0)
PLATELETS: 286 10*3/uL (ref 150.0–400.0)
RBC: 4.44 Mil/uL (ref 3.87–5.11)
RDW: 14.4 % (ref 11.5–15.5)
WBC: 8.2 10*3/uL (ref 4.0–10.5)

## 2014-08-03 LAB — LDL CHOLESTEROL, DIRECT: LDL DIRECT: 146 mg/dL

## 2014-08-03 MED ORDER — ATORVASTATIN CALCIUM 80 MG PO TABS: 80.0000 mg | ORAL_TABLET | Freq: Every day | ORAL | Status: DC

## 2014-08-03 MED ORDER — POTASSIUM CHLORIDE CRYS ER 20 MEQ PO TBCR: 20.0000 meq | EXTENDED_RELEASE_TABLET | Freq: Two times a day (BID) | ORAL | Status: DC

## 2014-08-03 MED ORDER — METOPROLOL SUCCINATE ER 25 MG PO TB24: 25.0000 mg | ORAL_TABLET | Freq: Every day | ORAL | Status: DC

## 2014-08-03 MED ORDER — METOPROLOL SUCCINATE ER 25 MG PO TB24: 50.0000 mg | ORAL_TABLET | Freq: Every day | ORAL | Status: DC

## 2014-08-03 MED ORDER — METOPROLOL SUCCINATE ER 50 MG PO TB24: 50.0000 mg | ORAL_TABLET | Freq: Every day | ORAL | Status: DC

## 2014-08-03 MED ORDER — EZETIMIBE 10 MG PO TABS: 10.0000 mg | ORAL_TABLET | Freq: Every day | ORAL | Status: DC

## 2014-08-03 NOTE — Telephone Encounter (Signed)
Lab entered for repeat cmp, sent in Potassium script for 5 days and scheduled lab appt. Per PCP instructions

## 2014-08-03 NOTE — Progress Notes (Signed)
Marissa Guzman  [id number] 10-Jun-1965 08/03/2014      Progress Note-Follow Up  Subjective  Chief Complaint  Chief Complaint  Patient presents with  . Follow-up    6 month    HPI  Patient is a 49 y.o. female in today for routine medical care.  Patient is in today for routine care and generally feels well. Only complaint is of some mild abdominal bloating. No anorexia, nausea, vomiting, change in bowel habits, diarrhea or other acute concerns. No fevers or recent illness. Continues to follow with gynecology for Pap smears Dr. Carlis Abbott. Denies CP/palp/SOB/HA/congestion/fevers/GI or GU c/o. Taking meds as prescribed Past Medical History  Diagnosis Date  . HTN (hypertension)   . Hyperlipidemia   . Seasonal allergies   . Acute bronchitis 05/16/2012  . Allergic state 05/16/2012  . Hypokalemia 11/14/2012  . GERD (gastroesophageal reflux disease) 06/12/2013  . Constipation 04/16/2014    Past Surgical History  Procedure Laterality Date  . Hemorrhoid surgery      two  . Cesarean section      Family History  Problem Relation Age of Onset  . Heart disease Father     Deceased  . Hypertension Father   . Lung cancer Father   . Melanoma Father     Eye Removal  . Hyperlipidemia Mother   . Hypertension Mother   . Hyperlipidemia Maternal Grandmother   . Diabetes Maternal Grandmother   . Dementia Maternal Grandmother   . Hypertension Sister     x2  . Hyperlipidemia Sister     x2    History   Social History  . Marital Status: Married    Spouse Name: N/A  . Number of Children: N/A  . Years of Education: N/A   Occupational History  . Not on file.   Social History Main Topics  . Smoking status: Never Smoker   . Smokeless tobacco: Not on file  . Alcohol Use: No  . Drug Use: No  . 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 Not on file   Social  History Narrative    Current Outpatient Prescriptions on File Prior to Visit  Medication Sig Dispense Refill  . cetirizine (ZYRTEC) 10 MG tablet Take 10 mg by mouth daily as needed.    . norgestrel-ethinyl estradiol (LO/OVRAL,CRYSELLE) 0.3-30 MG-MCG tablet Take 1 tablet by mouth daily. 1 Package 0  . omeprazole (PRILOSEC) 20 MG capsule Take 20 mg by mouth daily.    . fluticasone (FLONASE) 50 MCG/ACT nasal spray Place 2 sprays into the nose daily as needed.    . psyllium (METAMUCIL) 58.6 % powder Take 1 packet by mouth daily.     No current facility-administered medications on file prior to visit.    Allergies  Allergen Reactions  . Niacin And Related     Extreme flushing despite taking it correctly  . Tetracyclines & Related Other (See Comments)    GI Issues.    Review of Systems  Review of Systems  Constitutional: Negative for fever and malaise/fatigue.  HENT: Negative for congestion.   Eyes: Negative for discharge.  Respiratory: Negative for shortness of breath.   Cardiovascular: Negative for chest pain, palpitations and leg swelling.  Gastrointestinal: Negative for nausea, abdominal pain and diarrhea.  Genitourinary: Negative for dysuria.  Musculoskeletal: Negative for falls.  Skin: Negative for rash.  Neurological: Negative for  loss of consciousness and headaches.  Endo/Heme/Allergies: Negative for polydipsia.  Psychiatric/Behavioral: Negative for depression and suicidal ideas. The patient is not nervous/anxious and does not have insomnia.     Objective  BP 120/86 mmHg  Pulse 108  Temp(Src) 98.8 F (37.1 C) (Oral)  Ht 5' 3.5" (1.613 m)  Wt 154 lb 2 oz (69.911 kg)  BMI 26.87 kg/m2  SpO2 94%  Physical Exam  Physical Exam  Lab Results  Component Value Date   TSH 1.08 02/02/2014   Lab Results  Component Value Date   WBC 9.1 04/10/2014   HGB 15.2* 04/10/2014   HCT 44.9 04/10/2014   MCV 87.3 04/10/2014   PLT 310.0 04/10/2014   Lab Results  Component  Value Date   CREATININE 0.91 04/10/2014   BUN 17 04/10/2014   NA 137 04/10/2014   K 3.7 04/10/2014   CL 101 04/10/2014   CO2 23 04/10/2014   Lab Results  Component Value Date   ALT 32 04/10/2014   AST 46* 04/10/2014   ALKPHOS 68 04/10/2014   BILITOT 1.0 04/10/2014   Lab Results  Component Value Date   CHOL 213* 02/02/2014   Lab Results  Component Value Date   HDL 40.30 02/02/2014   Lab Results  Component Value Date   LDLCALC 116* 11/29/2013   Lab Results  Component Value Date   TRIG 206.0* 02/02/2014   Lab Results  Component Value Date   CHOLHDL 5 02/02/2014     Assessment & Plan  Essential hypertension Well controlled, no changes to meds. Encouraged heart healthy diet such as the DASH diet and exercise as tolerated.    GERD (gastroesophageal reflux disease) Avoid offending foods, start probiotics, given several options to try. Do not eat large meals in late evening and consider raising head of bed.    Hyperlipidemia Tolerating statin, encouraged heart healthy diet, avoid trans fats, minimize simple carbs and saturated fats. Increase exercise as tolerated

## 2014-08-03 NOTE — Patient Instructions (Addendum)
[employer] at [email or web address] they have 10 strain    Nonspecific Tachycardia Tachycardia is a faster than normal heartbeat (more than 100 beats per minute). In adults, the heart normally beats between 60 and 100 times a minute. A fast heartbeat may be a normal response to exercise or stress. It does not necessarily mean that something is wrong. However, sometimes when your heart beats too fast it may not be able to pump enough blood to the rest of your body. This can result in chest pain, shortness of breath, dizziness, and even fainting. Nonspecific tachycardia means that the specific cause or pattern of your tachycardia is unknown. CAUSES  Tachycardia may be harmless or it may be due to a more serious underlying cause. Possible causes of tachycardia include:  Exercise or exertion.  Fever.  Pain or injury.  Infection.  Loss of body fluids (dehydration).  Overactive thyroid.  Lack of red blood cells (anemia).  Anxiety and stress.  Alcohol.  Caffeine.  Tobacco products.  Diet pills.  Illegal drugs.  Heart disease. SYMPTOMS  Rapid or irregular heartbeat (palpitations).  Suddenly feeling your heart beating (cardiac awareness).  Dizziness.  Tiredness (fatigue).  Shortness of breath.  Chest pain.  Nausea.  Fainting. DIAGNOSIS  Your caregiver will perform a physical exam and take your medical history. In some cases, a heart specialist (cardiologist) may be consulted. Your caregiver may also order:  Blood tests.  Electrocardiography. This test records the electrical activity of your heart.  A heart monitoring test. TREATMENT  Treatment will depend on the likely cause of your tachycardia. The goal is to treat the underlying cause of your tachycardia. Treatment methods may include:  Replacement of fluids or blood through an intravenous (IV) tube for moderate to severe dehydration or anemia.  New medicines or changes in your current medicines.  Diet and  lifestyle changes.  Treatment for certain infections.  Stress relief or relaxation methods. HOME CARE INSTRUCTIONS   Rest.  Drink enough fluids to keep your urine clear or pale yellow.  Do not smoke.  Avoid:  Caffeine.  Tobacco.  Alcohol.  Chocolate.  Stimulants such as over-the-counter diet pills or pills that help you stay awake.  Situations that cause anxiety or stress.  Illegal drugs such as marijuana, phencyclidine (PCP), and cocaine.  Only take medicine as directed by your caregiver.  Keep all follow-up appointments as directed by your caregiver. SEEK IMMEDIATE MEDICAL CARE IF:   You have pain in your chest, upper arms, jaw, or neck.  You become weak, dizzy, or feel faint.  You have palpitations that will not go away.  You vomit, have diarrhea, or pass blood in your stool.  Your skin is cool, pale, and wet.  You have a fever that will not go away with rest, fluids, and medicine. MAKE SURE YOU:   Understand these instructions.  Will watch your condition.  Will get help right away if you are not doing well or get worse. Document Released: 04/10/2004 Document Revised: 05/26/2011 Document Reviewed: 02/11/2011 [facility] Patient Information 2015 [place], [place]. This information is not intended to replace advice given to you by your health care provider. Make sure you discuss any questions you have with your health care provider. Fatty Liver Fatty liver is the accumulation of fat in liver cells. It is also called hepatosteatosis or steatohepatitis. It is normal for your liver to contain some fat. If fat is more than 5 to 10% of your liver's weight, you have fatty liver.  There  are often no symptoms (problems) for years while damage is still occurring. People often learn about their fatty liver when they have medical tests for other reasons. Fat can damage your liver for years or even decades without causing problems. When it becomes severe, it can cause fatigue,  weight loss, weakness, and confusion. This makes you more likely to develop more serious liver problems. The liver is the largest organ in the body. It does a lot of work and often gives no warning signs when it is sick until late in a disease. The liver has many important jobs including:  Breaking down foods.  Storing vitamins, iron, and other minerals.  Making proteins.  Making bile for food digestion.  Breaking down many products including medications, alcohol and some poisons. CAUSES  There are a number of different conditions, medications, and poisons that can cause a fatty liver. Eating too many calories causes fat to build up in the liver. Not processing and breaking fats down normally may also cause this. Certain conditions, such as obesity, diabetes, and high triglycerides also cause this. Most fatty liver patients tend to be middle-aged and over weight.  Some causes of fatty liver are:  Alcohol over consumption.  Malnutrition.  Steroid use.  Valproic acid toxicity.  Obesity.  Cushing's syndrome.  Poisons.  Tetracycline in high dosages.  Pregnancy.  Diabetes.  Hyperlipidemia.  Rapid weight loss. Some people develop fatty liver even having none of these conditions. SYMPTOMS  Fatty liver most often causes no problems. This is called asymptomatic.  It can be diagnosed with blood tests and also by a liver biopsy.  It is one of the most common causes of minor elevations of liver enzymes on routine blood tests.  Specialized Imaging of the liver using ultrasound, CT (computed tomography) scan, or MRI (magnetic resonance imaging) can suggest a fatty liver but a biopsy is needed to confirm it.  A biopsy involves taking a small sample of liver tissue. This is done by using a needle. It is then looked at under a microscope by a specialist. TREATMENT  It is important to treat the cause. Simple fatty liver without a medical reason may not need treatment.  Weight  loss, fat restriction, and exercise in overweight patients produces inconsistent results but is worth trying.  Fatty liver due to alcohol toxicity may not improve even with stopping drinking.  Good control of diabetes may reduce fatty liver.  Lower your triglycerides through diet, medication or both.  Eat a balanced, healthy diet.  Increase your physical activity.  Get regular checkups from a liver specialist.  There are no medical or surgical treatments for a fatty liver or NASH, but improving your diet and increasing your exercise may help prevent or reverse some of the damage. PROGNOSIS  Fatty liver may cause no damage or it can lead to an inflammation of the liver. This is, called steatohepatitis. When it is linked to alcohol abuse, it is called alcoholic steatohepatitis. It often is not linked to alcohol. It is then called nonalcoholic steatohepatitis, or NASH. Over time the liver may become scarred and hardened. This condition is called cirrhosis. Cirrhosis is serious and may lead to liver failure or cancer. NASH is one of the leading causes of cirrhosis. About 10-20% of Americans have fatty liver and a smaller 2-5% has NASH. Document Released: 04/18/2005 Document Revised: 05/26/2011 Document Reviewed: 07/13/2013 [facility] Patient Information 2015 [place], [place]. This information is not intended to replace advice given to you by your health  care provider. Make sure you discuss any questions you have with your health care provider.  

## 2014-08-03 NOTE — Progress Notes (Signed)
Pre visit review using our clinic review tool, if applicable. No additional management support is needed unless otherwise documented below in the visit note. 

## 2014-08-04 ENCOUNTER — Ambulatory Visit (HOSPITAL_BASED_OUTPATIENT_CLINIC_OR_DEPARTMENT_OTHER)
Admission: RE | Admit: 2014-08-04 | Discharge: 2014-08-04 | Disposition: A | Discharge status: Home / Self Care | Payer: BLUE CROSS/BLUE SHIELD | Source: Ambulatory Visit | Attending: Family Medicine | Admitting: Family Medicine

## 2014-08-04 ENCOUNTER — Other Ambulatory Visit: Payer: Self-pay | Admitting: Family Medicine

## 2014-08-04 DIAGNOSIS — K760 Fatty (change of) liver, not elsewhere classified: Secondary | ICD-10-CM | POA: Insufficient documentation

## 2014-08-04 DIAGNOSIS — R7989 Other specified abnormal findings of blood chemistry: Secondary | ICD-10-CM | POA: Diagnosis present

## 2014-08-04 DIAGNOSIS — I10 Essential (primary) hypertension: Secondary | ICD-10-CM

## 2014-08-04 DIAGNOSIS — E785 Hyperlipidemia, unspecified: Secondary | ICD-10-CM

## 2014-08-04 LAB — HEPATITIS PANEL, ACUTE
HCV Ab: NEGATIVE
Hep A IgM: NONREACTIVE
Hep B C IgM: NONREACTIVE
Hepatitis B Surface Ag: NEGATIVE

## 2014-08-04 MED ORDER — COLESEVELAM HCL 3.75 G PO PACK: PACK | ORAL | Status: DC

## 2014-08-07 ENCOUNTER — Other Ambulatory Visit (INDEPENDENT_AMBULATORY_CARE_PROVIDER_SITE_OTHER): Payer: BLUE CROSS/BLUE SHIELD

## 2014-08-07 DIAGNOSIS — E876 Hypokalemia: Secondary | ICD-10-CM | POA: Diagnosis not present

## 2014-08-07 LAB — COMPREHENSIVE METABOLIC PANEL
ALBUMIN: 3.9 g/dL (ref 3.5–5.2)
ALK PHOS: 59 U/L (ref 39–117)
ALT: 18 U/L (ref 0–35)
AST: 26 U/L (ref 0–37)
BUN: 15 mg/dL (ref 6–23)
CALCIUM: 9.2 mg/dL (ref 8.4–10.5)
CO2: 24 mEq/L (ref 19–32)
CREATININE: 0.91 mg/dL (ref 0.40–1.20)
Chloride: 100 mEq/L (ref 96–112)
GFR: 69.79 mL/min (ref >60.00)
Glucose, Bld: 107 mg/dL — ABNORMAL HIGH (ref 70–99)
POTASSIUM: 3.6 meq/L (ref 3.5–5.1)
Sodium: 134 mEq/L — ABNORMAL LOW (ref 135–145)
Total Bilirubin: 1.1 mg/dL (ref 0.2–1.2)
Total Protein: 7.1 g/dL (ref 6.0–8.3)

## 2014-08-14 NOTE — Assessment & Plan Note (Addendum)
Avoid offending foods, start probiotics, given several options to try. Do not eat large meals in late evening and consider raising head of bed.

## 2014-08-14 NOTE — Assessment & Plan Note (Signed)
Well controlled, no changes to meds. Encouraged heart healthy diet such as the DASH diet and exercise as tolerated.  °

## 2014-08-14 NOTE — Assessment & Plan Note (Signed)
Tolerating statin, encouraged heart healthy diet, avoid trans fats, minimize simple carbs and saturated fats. Increase exercise as tolerated 

## 2014-09-05 ENCOUNTER — Ambulatory Visit: Payer: BLUE CROSS/BLUE SHIELD | Admitting: Family Medicine

## 2014-09-26 ENCOUNTER — Ambulatory Visit (INDEPENDENT_AMBULATORY_CARE_PROVIDER_SITE_OTHER): Payer: BLUE CROSS/BLUE SHIELD | Admitting: Family Medicine

## 2014-09-26 ENCOUNTER — Encounter: Payer: Self-pay | Admitting: Family Medicine

## 2014-09-26 VITALS — BP 108/72 | HR 86 | Temp 98.2°F | Ht 64.5 in | Wt 142.4 lb

## 2014-09-26 DIAGNOSIS — K219 Gastro-esophageal reflux disease without esophagitis: Secondary | ICD-10-CM

## 2014-09-26 DIAGNOSIS — K5900 Constipation, unspecified: Secondary | ICD-10-CM | POA: Diagnosis not present

## 2014-09-26 DIAGNOSIS — E782 Mixed hyperlipidemia: Secondary | ICD-10-CM

## 2014-09-26 DIAGNOSIS — I10 Essential (primary) hypertension: Secondary | ICD-10-CM | POA: Diagnosis not present

## 2014-09-26 DIAGNOSIS — E785 Hyperlipidemia, unspecified: Secondary | ICD-10-CM

## 2014-09-26 MED ORDER — LISINOPRIL 20 MG PO TABS: 20.0000 mg | ORAL_TABLET | Freq: Every day | ORAL | Status: DC

## 2014-09-26 MED ORDER — RANITIDINE HCL 300 MG PO TABS: 300.0000 mg | ORAL_TABLET | Freq: Every day | ORAL | Status: DC

## 2014-09-26 MED ORDER — METOPROLOL SUCCINATE ER 50 MG PO TB24: 50.0000 mg | ORAL_TABLET | Freq: Every day | ORAL | Status: DC

## 2014-09-26 NOTE — Progress Notes (Signed)
Pre visit review using our clinic review tool, if applicable. No additional management support is needed unless otherwise documented below in the visit note. 

## 2014-09-26 NOTE — Patient Instructions (Signed)
Food Choices for Gastroesophageal Reflux Disease  When you have gastroesophageal reflux disease (GERD), the foods you eat and your eating habits are very important. Choosing the right foods can help ease your discomfort.   WHAT GUIDELINES DO I NEED TO FOLLOW?   · Choose fruits, vegetables, whole grains, and low-fat dairy products.    · Choose low-fat meat, fish, and poultry.  · Limit fats such as oils, salad dressings, butter, nuts, and avocado.    · Keep a food diary. This helps you identify foods that cause symptoms.    · Avoid foods that cause symptoms. These may be different for everyone.    · Eat small meals often instead of 3 large meals a day.    · Eat your meals slowly, in a place where you are relaxed.    · Limit fried foods.    · Cook foods using methods other than frying.    · Avoid drinking alcohol.    · Avoid drinking large amounts of liquids with your meals.    · Avoid bending over or lying down until 2-3 hours after eating.    WHAT FOODS ARE NOT RECOMMENDED?   These are some foods and drinks that may make your symptoms worse:  Vegetables  Tomatoes. Tomato juice. Tomato and spaghetti sauce. Chili peppers. Onion and garlic. Horseradish.  Fruits  Oranges, grapefruit, and lemon (fruit and juice).  Meats  High-fat meats, fish, and poultry. This includes hot dogs, ribs, ham, sausage, salami, and bacon.  Dairy  Whole milk and chocolate milk. Sour cream. Cream. Butter. Ice cream. Cream cheese.   Drinks  Coffee and tea. Bubbly (carbonated) drinks or energy drinks.  Condiments  Hot sauce. Barbecue sauce.   Sweets/Desserts  Chocolate and cocoa. Donuts. Peppermint and spearmint.  Fats and Oils  High-fat foods. This includes French fries and potato chips.  Other  Vinegar. Strong spices. This includes black pepper, white pepper, red pepper, cayenne, curry powder, cloves, ginger, and chili powder.  The items listed above may not be a complete list of foods and drinks to avoid. Contact your dietitian for more  information.  Document Released: 09/02/2011 Document Revised: 03/08/2013 Document Reviewed: 01/05/2013  [employer]® Patient Information ©2015 [employer]. This information is not intended to replace advice given to you by your health care provider. Make sure you discuss any questions you have with your health care provider.

## 2014-10-08 ENCOUNTER — Encounter: Payer: Self-pay | Admitting: Family Medicine

## 2014-10-08 NOTE — Progress Notes (Signed)
Marissa Guzman  [id number] 1965/04/11 10/08/2014      Progress Note-Follow Up  Subjective  Chief Complaint  Chief Complaint  Patient presents with  . Hypertension    HPI  Patient is a 49 y.o. female in today for routine medical care. Patient in today for follow up and is doing well. She has made some dietary changes that have helped her loose weight and heartburn symptoms are improved. Only intermittent symptoms now. Denies CP/palp/SOB/HA/congestion/fevers/GI or GU c/o. Taking meds as prescribed  Past Medical History  Diagnosis Date  . HTN (hypertension)   . Hyperlipidemia   . Seasonal allergies   . Acute bronchitis 05/16/2012  . Allergic state 05/16/2012  . Hypokalemia 11/14/2012  . GERD (gastroesophageal reflux disease) 06/12/2013  . Constipation 04/16/2014    Past Surgical History  Procedure Laterality Date  . Hemorrhoid surgery      two  . Cesarean section      Family History  Problem Relation Age of Onset  . Heart disease Father     Deceased  . Hypertension Father   . Lung cancer Father   . Melanoma Father     Eye Removal  . Hyperlipidemia Mother   . Hypertension Mother   . Hyperlipidemia Maternal Grandmother   . Diabetes Maternal Grandmother   . Dementia Maternal Grandmother   . Hypertension Sister     x2  . Hyperlipidemia Sister     x2    History   Social History  . Marital Status: Married    Spouse Name: N/A  . Number of Children: N/A  . Years of Education: N/A   Occupational History  . Not on file.   Social History Main Topics  . Smoking status: Never Smoker   . Smokeless tobacco: Not on file  . Alcohol Use: No  . Drug Use: No  . 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 Not on file   Social History Narrative    Current Outpatient Prescriptions on File Prior to Visit  Medication Sig Dispense Refill  .  atorvastatin (LIPITOR) 80 MG tablet Take 1 tablet (80 mg total) by mouth daily. 90 tablet 1  . cetirizine (ZYRTEC) 10 MG tablet Take 10 mg by mouth daily as needed.    . chlorthalidone (HYGROTON) 25 MG tablet Take 25 mg by mouth daily.    Marland Kitchen ezetimibe (ZETIA) 10 MG tablet Take 1 tablet (10 mg total) by mouth daily. 90 tablet 1  . fluticasone (FLONASE) 50 MCG/ACT nasal spray Place 2 sprays into the nose daily as needed.    . norgestrel-ethinyl estradiol (LO/OVRAL,CRYSELLE) 0.3-30 MG-MCG tablet Take 1 tablet by mouth daily. 1 Package 0  . omeprazole (PRILOSEC) 20 MG capsule Take 20 mg by mouth daily.    . psyllium (METAMUCIL) 58.6 % powder Take 1 packet by mouth daily.     No current facility-administered medications on file prior to visit.    Allergies  Allergen Reactions  . Niacin And Related     Extreme flushing despite taking it correctly  . Tetracyclines & Related Other (See Comments)    GI Issues.    Review of Systems  Review of Systems  Constitutional: Negative for fever and malaise/fatigue.  HENT: Negative for congestion.   Eyes: Negative for discharge.  Respiratory: Negative for shortness of breath.   Cardiovascular: Negative for chest pain, palpitations  and leg swelling.  Gastrointestinal: Positive for heartburn. Negative for nausea, abdominal pain and diarrhea.  Genitourinary: Negative for dysuria.  Musculoskeletal: Negative for falls.  Skin: Negative for rash.  Neurological: Negative for loss of consciousness and headaches.  Endo/Heme/Allergies: Negative for polydipsia.  Psychiatric/Behavioral: Negative for depression and suicidal ideas. The patient is not nervous/anxious and does not have insomnia.     Objective  BP 108/72 mmHg  Pulse 86  Temp(Src) 98.2 F (36.8 C) (Oral)  Ht 5' 4.5" (1.638 m)  Wt 142 lb 6 oz (64.581 kg)  BMI 24.07 kg/m2  SpO2 98%  Physical Exam  Physical Exam  Constitutional: She is oriented to person, place, and time and well-developed,  well-nourished, and in no distress. No distress.  HENT:  Head: Normocephalic and atraumatic.  Eyes: Conjunctivae are normal.  Neck: Neck supple. No thyromegaly present.  Cardiovascular: Normal rate, regular rhythm and normal heart sounds.   No murmur heard. Pulmonary/Chest: Effort normal and breath sounds normal. She has no wheezes.  Abdominal: She exhibits no distension and no mass.  Musculoskeletal: She exhibits no edema.  Lymphadenopathy:    She has no cervical adenopathy.  Neurological: She is alert and oriented to person, place, and time.  Skin: Skin is warm and dry. No rash noted. She is not diaphoretic.  Psychiatric: Memory, affect and judgment normal.    Lab Results  Component Value Date   TSH 1.41 08/03/2014   Lab Results  Component Value Date   WBC 8.2 08/03/2014   HGB 13.3 08/03/2014   HCT 39.1 08/03/2014   MCV 88.2 08/03/2014   PLT 286.0 08/03/2014   Lab Results  Component Value Date   CREATININE 0.91 08/07/2014   BUN 15 08/07/2014   NA 134* 08/07/2014   K 3.6 08/07/2014   CL 100 08/07/2014   CO2 24 08/07/2014   Lab Results  Component Value Date   ALT 18 08/07/2014   AST 26 08/07/2014   ALKPHOS 59 08/07/2014   BILITOT 1.1 08/07/2014   Lab Results  Component Value Date   CHOL 211* 08/03/2014   Lab Results  Component Value Date   HDL 37.80* 08/03/2014   Lab Results  Component Value Date   LDLCALC 116* 11/29/2013   Lab Results  Component Value Date   TRIG 255.0* 08/03/2014   Lab Results  Component Value Date   CHOLHDL 6 08/03/2014     Assessment & Plan  Hyperlipidemia Tolerating statin, encouraged heart healthy diet, avoid trans fats, minimize simple carbs and saturated fats. Increase exercise as tolerated  Essential hypertension Well controlled, no changes to meds. Encouraged heart healthy diet such as the DASH diet and exercise as tolerated.   GERD (gastroesophageal reflux disease) Avoid offending foods, start probiotics. Do not  eat large meals in late evening and consider raising head of bed. Improved with dietary changes also good weight loss noted.   Constipation Encouraged increased hydration and fiber in diet. Daily probiotics.

## 2014-10-08 NOTE — Assessment & Plan Note (Signed)
Tolerating statin, encouraged heart healthy diet, avoid trans fats, minimize simple carbs and saturated fats. Increase exercise as tolerated 

## 2014-10-08 NOTE — Assessment & Plan Note (Signed)
Encouraged increased hydration and fiber in diet. Daily probiotics.  

## 2014-10-08 NOTE — Assessment & Plan Note (Signed)
Avoid offending foods, start probiotics. Do not eat large meals in late evening and consider raising head of bed. Improved with dietary changes also good weight loss noted.

## 2014-10-08 NOTE — Assessment & Plan Note (Signed)
Well controlled, no changes to meds. Encouraged heart healthy diet such as the DASH diet and exercise as tolerated.  °

## 2014-10-09 ENCOUNTER — Telehealth: Payer: Self-pay | Admitting: Family Medicine

## 2014-10-09 NOTE — Telephone Encounter (Signed)
So max of the Ranitidine is the 300 mg once daily but she can take the Omeprazole 20 mg po bid with the Ranitidine. Make sure she is taking a probiotic and if she is trying all of these things then I should refer her to gastroenterology for further consideration.

## 2014-10-09 NOTE — Telephone Encounter (Signed)
Please advise 

## 2014-10-09 NOTE — Telephone Encounter (Signed)
Caller name: Arial Relation to pt: self Call back number: [phone] Pharmacy: [id number] - [place], [place] - [address] AT [facility] [address]  Reason for call:  Pt states has been prescribed ranitidine (ZANTAC) 300 MG tablet on  09-26-14 for her heart burns, and it is still not working, pt is wanting to know if she can take Two of the ranitidine or can she be prescribed something else that can help her with her heart burns. Please advise.

## 2014-10-10 NOTE — Telephone Encounter (Signed)
Notified patient who stated understanding.  She states she will try the Omeprazole and call back if it is not helping.

## 2014-11-03 ENCOUNTER — Other Ambulatory Visit (INDEPENDENT_AMBULATORY_CARE_PROVIDER_SITE_OTHER): Payer: BLUE CROSS/BLUE SHIELD

## 2014-11-03 DIAGNOSIS — I10 Essential (primary) hypertension: Secondary | ICD-10-CM

## 2014-11-03 DIAGNOSIS — K219 Gastro-esophageal reflux disease without esophagitis: Secondary | ICD-10-CM

## 2014-11-03 DIAGNOSIS — K5900 Constipation, unspecified: Secondary | ICD-10-CM

## 2014-11-03 DIAGNOSIS — E785 Hyperlipidemia, unspecified: Secondary | ICD-10-CM

## 2014-11-03 DIAGNOSIS — E782 Mixed hyperlipidemia: Secondary | ICD-10-CM

## 2014-11-03 LAB — CBC
HEMATOCRIT: 40.8 % (ref 36.0–46.0)
HEMOGLOBIN: 13.7 g/dL (ref 12.0–15.0)
MCHC: 33.5 g/dL (ref 30.0–36.0)
MCV: 89.3 fl (ref 78.0–100.0)
Platelets: 281 10*3/uL (ref 150.0–400.0)
RBC: 4.57 Mil/uL (ref 3.87–5.11)
RDW: 13.7 % (ref 11.5–15.5)
WBC: 7.7 10*3/uL (ref 4.0–10.5)

## 2014-11-03 LAB — LIPID PANEL
CHOL/HDL RATIO: 6
Cholesterol: 181 mg/dL (ref 0–200)
HDL: 31.8 mg/dL — AB (ref >39.00)
LDL CALC: 120 mg/dL — AB (ref 0–99)
NonHDL: 149.13
Triglycerides: 146 mg/dL (ref 0.0–149.0)
VLDL: 29.2 mg/dL (ref 0.0–40.0)

## 2014-11-03 LAB — TSH: TSH: 1.3 u[IU]/mL (ref 0.35–4.50)

## 2014-11-03 LAB — COMPREHENSIVE METABOLIC PANEL
ALT: 17 U/L (ref 0–35)
AST: 21 U/L (ref 0–37)
Albumin: 4 g/dL (ref 3.5–5.2)
Alkaline Phosphatase: 67 U/L (ref 39–117)
BUN: 13 mg/dL (ref 6–23)
CALCIUM: 9.3 mg/dL (ref 8.4–10.5)
CO2: 29 meq/L (ref 19–32)
Chloride: 99 mEq/L (ref 96–112)
Creatinine, Ser: 0.94 mg/dL (ref 0.40–1.20)
GFR: 67.16 mL/min (ref >60.00)
GLUCOSE: 92 mg/dL (ref 70–99)
Potassium: 2.9 mEq/L — ABNORMAL LOW (ref 3.5–5.1)
Sodium: 139 mEq/L (ref 135–145)
Total Bilirubin: 1.2 mg/dL (ref 0.2–1.2)
Total Protein: 7.2 g/dL (ref 6.0–8.3)

## 2014-11-03 LAB — H. PYLORI ANTIBODY, IGG: H PYLORI IGG: NEGATIVE

## 2014-11-03 LAB — HEMOGLOBIN A1C: Hgb A1c MFr Bld: 5.9 % (ref 4.6–6.5)

## 2014-11-05 ENCOUNTER — Other Ambulatory Visit: Payer: Self-pay | Admitting: Family Medicine

## 2014-11-05 MED ORDER — POTASSIUM CHLORIDE CRYS ER 20 MEQ PO TBCR: EXTENDED_RELEASE_TABLET | ORAL | Status: DC

## 2015-01-29 ENCOUNTER — Other Ambulatory Visit: Payer: BLUE CROSS/BLUE SHIELD

## 2015-02-05 ENCOUNTER — Encounter: Payer: BLUE CROSS/BLUE SHIELD | Admitting: Family Medicine

## 2015-02-20 ENCOUNTER — Telehealth: Payer: Self-pay | Admitting: Family Medicine

## 2015-02-21 NOTE — Telephone Encounter (Signed)
error 

## 2015-04-24 ENCOUNTER — Telehealth: Payer: Self-pay | Admitting: Family Medicine

## 2015-04-24 NOTE — Telephone Encounter (Signed)
Documented in health maintenance.

## 2015-04-24 NOTE — Telephone Encounter (Signed)
Patient declined receiving flu shot  °

## 2015-06-05 ENCOUNTER — Telehealth: Payer: Self-pay | Admitting: Family Medicine

## 2015-06-05 NOTE — Telephone Encounter (Signed)
Pt is requesting a call back from CMA directly.   CB: [phone]

## 2015-06-05 NOTE — Telephone Encounter (Signed)
PCP has been prescribing BP medication, but OBGYN also has been changing BP medications and now pt.is now confused as to what she should be taking.  Advised needs to schedule with PCP asap, bring in all bottles of BP meds. To see what she currently is taking and discuss with PCP. Transferred to the front for appt.

## 2015-06-12 ENCOUNTER — Ambulatory Visit (INDEPENDENT_AMBULATORY_CARE_PROVIDER_SITE_OTHER): Payer: BLUE CROSS/BLUE SHIELD | Admitting: Family Medicine

## 2015-06-12 ENCOUNTER — Encounter: Payer: Self-pay | Admitting: Family Medicine

## 2015-06-12 VITALS — BP 162/102 | HR 84 | Temp 98.7°F | Ht 63.5 in | Wt 159.4 lb

## 2015-06-12 DIAGNOSIS — K219 Gastro-esophageal reflux disease without esophagitis: Secondary | ICD-10-CM | POA: Diagnosis not present

## 2015-06-12 DIAGNOSIS — E785 Hyperlipidemia, unspecified: Secondary | ICD-10-CM | POA: Diagnosis not present

## 2015-06-12 DIAGNOSIS — I10 Essential (primary) hypertension: Secondary | ICD-10-CM

## 2015-06-12 MED ORDER — LISINOPRIL 20 MG PO TABS: 20.0000 mg | ORAL_TABLET | Freq: Every day | ORAL | Status: DC

## 2015-06-12 MED ORDER — CHLORTHALIDONE 25 MG PO TABS: 25.0000 mg | ORAL_TABLET | Freq: Every day | ORAL | Status: DC

## 2015-06-12 MED ORDER — METOPROLOL SUCCINATE ER 50 MG PO TB24: 50.0000 mg | ORAL_TABLET | Freq: Every day | ORAL | Status: DC

## 2015-06-12 MED ORDER — ATORVASTATIN CALCIUM 80 MG PO TABS: 80.0000 mg | ORAL_TABLET | Freq: Every day | ORAL | Status: DC

## 2015-06-12 MED FILL — LISINOPRIL 20 MG TABLET: 20 | 90 days supply | Qty: 90 | Fill #0

## 2015-06-12 MED FILL — ATORVASTATIN 80 MG TABLET: 80 | 90 days supply | Qty: 90 | Fill #0

## 2015-06-12 MED FILL — CHLORTHALIDONE 25 MG TABLET: 25 | 90 days supply | Qty: 90 | Fill #0

## 2015-06-12 NOTE — Assessment & Plan Note (Signed)
Encouraged heart healthy diet, increase exercise, avoid trans fats, consider a krill oil cap daily 

## 2015-06-12 NOTE — Assessment & Plan Note (Signed)
Not on meds at the present time, no changes to meds. Encouraged heart healthy diet such as the DASH diet and exercise as tolerated. Restart Metoprolol, Lisinopril, Chlorthalidone and bp check with CMP in 3-4 weeks. Increase K in diet

## 2015-06-12 NOTE — Patient Instructions (Signed)
Hypertension Hypertension, commonly called high blood pressure, is when the force of blood pumping through your arteries is too strong. Your arteries are the blood vessels that carry blood from your heart throughout your body. A blood pressure reading consists of a higher number over a lower number, such as 110/72. The higher number (systolic) is the pressure inside your arteries when your heart pumps. The lower number (diastolic) is the pressure inside your arteries when your heart relaxes. Ideally you want your blood pressure below 120/80. Hypertension forces your heart to work harder to pump blood. Your arteries may become narrow or stiff. Having untreated or uncontrolled hypertension can cause heart attack, stroke, kidney disease, and other problems. RISK FACTORS Some risk factors for high blood pressure are controllable. Others are not.  Risk factors you cannot control include:   Race. You may be at higher risk if you are African American.  Age. Risk increases with age.  Gender. Men are at higher risk than women before age 45 years. After age 65, women are at higher risk than men. Risk factors you can control include:  Not getting enough exercise or physical activity.  Being overweight.  Getting too much fat, sugar, calories, or salt in your diet.  Drinking too much alcohol. SIGNS AND SYMPTOMS Hypertension does not usually cause signs or symptoms. Extremely high blood pressure (hypertensive crisis) may cause headache, anxiety, shortness of breath, and nosebleed. DIAGNOSIS To check if you have hypertension, your health care provider will measure your blood pressure while you are seated, with your arm held at the level of your heart. It should be measured at least twice using the same arm. Certain conditions can cause a difference in blood pressure between your right and left arms. A blood pressure reading that is higher than normal on one occasion does not mean that you need treatment. If  it is not clear whether you have high blood pressure, you may be asked to return on a different day to have your blood pressure checked again. Or, you may be asked to monitor your blood pressure at home for 1 or more weeks. TREATMENT Treating high blood pressure includes making lifestyle changes and possibly taking medicine. Living a healthy lifestyle can help lower high blood pressure. You may need to change some of your habits. Lifestyle changes may include:  Following the DASH diet. This diet is high in fruits, vegetables, and whole grains. It is low in salt, red meat, and added sugars.  Keep your sodium intake below 2,300 mg per day.  Getting at least 30-45 minutes of aerobic exercise at least 4 times per week.  Losing weight if necessary.  Not smoking.  Limiting alcoholic beverages.  Learning ways to reduce stress. Your health care provider may prescribe medicine if lifestyle changes are not enough to get your blood pressure under control, and if one of the following is true:  You are 18-59 years of age and your systolic blood pressure is above 140.  You are 60 years of age or older, and your systolic blood pressure is above 150.  Your diastolic blood pressure is above 90.  You have diabetes, and your systolic blood pressure is over 140 or your diastolic blood pressure is over 90.  You have kidney disease and your blood pressure is above 140/90.  You have heart disease and your blood pressure is above 140/90. Your personal target blood pressure may vary depending on your medical conditions, your age, and other factors. HOME CARE INSTRUCTIONS    Have your blood pressure rechecked as directed by your health care provider.   Take medicines only as directed by your health care provider. Follow the directions carefully. Blood pressure medicines must be taken as prescribed. The medicine does not work as well when you skip doses. Skipping doses also puts you at risk for  problems.  Do not smoke.   Monitor your blood pressure at home as directed by your health care provider. SEEK MEDICAL CARE IF:   You think you are having a reaction to medicines taken.  You have recurrent headaches or feel dizzy.  You have swelling in your ankles.  You have trouble with your vision. SEEK IMMEDIATE MEDICAL CARE IF:  You develop a severe headache or confusion.  You have unusual weakness, numbness, or feel faint.  You have severe chest or abdominal pain.  You vomit repeatedly.  You have trouble breathing. MAKE SURE YOU:   Understand these instructions.  Will watch your condition.  Will get help right away if you are not doing well or get worse.   This information is not intended to replace advice given to you by your health care provider. Make sure you discuss any questions you have with your health care provider.   Document Released: 03/03/2005 Document Revised: 07/18/2014 Document Reviewed: 12/24/2012 Elsevier Interactive Patient Education 2016 [employer]  

## 2015-06-12 NOTE — Progress Notes (Signed)
Subjective:    Patient ID: Marissa Guzman, female    DOB: 09-28-1965, 50 y.o.   MRN: [id number]  Chief Complaint  Patient presents with  . Hypertension    HPI Patient is in today for concerns with hypertension. Patient has been having some leg and foot swelling and state feeling of an elephant on chest when she goes to lay down in bed.  Patient also reports fatigue and headaches.  Denies CP/palp/SOB/HA/congestion/fevers/GI or GU c/o. Taking meds as prescribed    Past Medical History  Diagnosis Date  . HTN (hypertension)   . Hyperlipidemia   . Seasonal allergies   . Acute bronchitis 05/16/2012  . Allergic state 05/16/2012  . Hypokalemia 11/14/2012  . GERD (gastroesophageal reflux disease) 06/12/2013  . Constipation 04/16/2014    Past Surgical History  Procedure Laterality Date  . Hemorrhoid surgery      two  . Cesarean section      Family History  Problem Relation Age of Onset  . Heart disease Father     Deceased  . Hypertension Father   . Lung cancer Father   . Melanoma Father     Eye Removal  . Hyperlipidemia Mother   . Hypertension Mother   . Hyperlipidemia Maternal Grandmother   . Diabetes Maternal Grandmother   . Dementia Maternal Grandmother   . Hypertension Sister     x2  . Hyperlipidemia Sister     x2    Social History   Social History  . Marital Status: Married    Spouse Name: N/A  . Number of Children: N/A  . Years of Education: N/A   Occupational History  . Not on file.   Social History Main Topics  . Smoking status: Never Smoker   . Smokeless tobacco: Not on file  . Alcohol Use: No  . Drug Use: No  . 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 Not on file   Social History Narrative    Outpatient Prescriptions Prior to Visit  Medication Sig Dispense Refill  . cetirizine (ZYRTEC) 10 MG tablet Take 10 mg by mouth daily  as needed.    . ezetimibe (ZETIA) 10 MG tablet Take 1 tablet (10 mg total) by mouth daily. 90 tablet 1  . fluticasone (FLONASE) 50 MCG/ACT nasal spray Place 2 sprays into the nose daily as needed.    . norgestrel-ethinyl estradiol (LO/OVRAL,CRYSELLE) 0.3-30 MG-MCG tablet Take 1 tablet by mouth daily. 1 Package 0  . omeprazole (PRILOSEC) 20 MG capsule Take 20 mg by mouth daily.    . potassium chloride SA (K-DUR,KLOR-CON) 20 MEQ tablet 2 tabs po qd x 5 days then 1 tab po daily 35 tablet 2  . psyllium (METAMUCIL) 58.6 % powder Take 1 packet by mouth daily.    . ranitidine (ZANTAC) 300 MG tablet Take 1 tablet (300 mg total) by mouth at bedtime. 30 tablet 5  . atorvastatin (LIPITOR) 80 MG tablet Take 1 tablet (80 mg total) by mouth daily. 90 tablet 1  . chlorthalidone (HYGROTON) 25 MG tablet Take 25 mg by mouth daily.    Marland Kitchen lisinopril (PRINIVIL,ZESTRIL) 20 MG tablet Take 1 tablet (20 mg total) by mouth daily. 90 tablet 1  . metoprolol succinate (TOPROL-XL) 50 MG 24 hr tablet Take 1 tablet (50 mg total) by mouth daily. Take with or immediately following a meal. 90 tablet  1   No facility-administered medications prior to visit.    Allergies  Allergen Reactions  . Niacin And Related     Extreme flushing despite taking it correctly  . Tetracyclines & Related Other (See Comments)    GI Issues.    Review of Systems  Constitutional: Negative for fever and malaise/fatigue.  HENT: Negative for congestion.   Eyes: Negative for blurred vision.  Respiratory: Negative for shortness of breath.   Cardiovascular: Positive for leg swelling. Negative for chest pain and palpitations.  Gastrointestinal: Negative for nausea, abdominal pain and blood in stool.  Genitourinary: Negative for dysuria and frequency.  Musculoskeletal: Negative for falls.  Skin: Negative for rash.  Neurological: Positive for headaches. Negative for dizziness and loss of consciousness.  Endo/Heme/Allergies: Negative for environmental  allergies.  Psychiatric/Behavioral: Negative for depression. The patient is not nervous/anxious.        Objective:    Physical Exam  Constitutional: She is oriented to person, place, and time. She appears well-developed and well-nourished. No distress.  HENT:  Head: Normocephalic and atraumatic.  Eyes: Conjunctivae are normal.  Neck: Neck supple. No thyromegaly present.  Cardiovascular: Normal rate, regular rhythm and normal heart sounds.   No murmur heard. Pulmonary/Chest: Effort normal and breath sounds normal. No respiratory distress.  Abdominal: Soft. Bowel sounds are normal. She exhibits no distension and no mass. There is no tenderness.  Musculoskeletal: She exhibits no edema.  Lymphadenopathy:    She has no cervical adenopathy.  Neurological: She is alert and oriented to person, place, and time.  Skin: Skin is warm and dry.  Psychiatric: She has a normal mood and affect. Her behavior is normal.    BP 162/102 mmHg  Pulse 84  Temp(Src) 98.7 F (37.1 C) (Oral)  Ht 5' 3.5" (1.613 m)  Wt 159 lb 6 oz (72.292 kg)  BMI 27.79 kg/m2  SpO2 95% Wt Readings from Last 3 Encounters:  06/12/15 159 lb 6 oz (72.292 kg)  09/26/14 142 lb 6 oz (64.581 kg)  08/03/14 154 lb 2 oz (69.911 kg)     Lab Results  Component Value Date   WBC 7.7 11/03/2014   HGB 13.7 11/03/2014   HCT 40.8 11/03/2014   PLT 281.0 11/03/2014   GLUCOSE 92 11/03/2014   CHOL 181 11/03/2014   TRIG 146.0 11/03/2014   HDL 31.80* 11/03/2014   LDLDIRECT 146.0 08/03/2014   LDLCALC 120* 11/03/2014   ALT 17 11/03/2014   AST 21 11/03/2014   NA 139 11/03/2014   K 2.9* 11/03/2014   CL 99 11/03/2014   CREATININE 0.94 11/03/2014   BUN 13 11/03/2014   CO2 29 11/03/2014   TSH 1.30 11/03/2014   HGBA1C 5.9 11/03/2014    Lab Results  Component Value Date   TSH 1.30 11/03/2014   Lab Results  Component Value Date   WBC 7.7 11/03/2014   HGB 13.7 11/03/2014   HCT 40.8 11/03/2014   MCV 89.3 11/03/2014   PLT  281.0 11/03/2014   Lab Results  Component Value Date   NA 139 11/03/2014   K 2.9* 11/03/2014   CO2 29 11/03/2014   GLUCOSE 92 11/03/2014   BUN 13 11/03/2014   CREATININE 0.94 11/03/2014   BILITOT 1.2 11/03/2014   ALKPHOS 67 11/03/2014   AST 21 11/03/2014   ALT 17 11/03/2014   PROT 7.2 11/03/2014   ALBUMIN 4.0 11/03/2014   CALCIUM 9.3 11/03/2014   GFR 67.16 11/03/2014   Lab Results  Component Value Date   CHOL  181 11/03/2014   Lab Results  Component Value Date   HDL 31.80* 11/03/2014   Lab Results  Component Value Date   LDLCALC 120* 11/03/2014   Lab Results  Component Value Date   TRIG 146.0 11/03/2014   Lab Results  Component Value Date   CHOLHDL 6 11/03/2014   Lab Results  Component Value Date   HGBA1C 5.9 11/03/2014       Assessment & Plan:   Problem List Items Addressed This Visit    Essential hypertension - Primary    Not on meds at the present time, no changes to meds. Encouraged heart healthy diet such as the DASH diet and exercise as tolerated. Restart Metoprolol, Lisinopril, Chlorthalidone and bp check with CMP in 3-4 weeks. Increase K in diet      Relevant Medications   metoprolol succinate (TOPROL-XL) 50 MG 24 hr tablet   lisinopril (PRINIVIL,ZESTRIL) 20 MG tablet   chlorthalidone (HYGROTON) 25 MG tablet   atorvastatin (LIPITOR) 80 MG tablet   GERD (gastroesophageal reflux disease)    Avoid offending foods, take probiotics. Do not eat large meals in late evening and consider raising head of bed.       Hyperlipidemia    Encouraged heart healthy diet, increase exercise, avoid trans fats, consider a krill oil cap daily      Relevant Medications   metoprolol succinate (TOPROL-XL) 50 MG 24 hr tablet   lisinopril (PRINIVIL,ZESTRIL) 20 MG tablet   chlorthalidone (HYGROTON) 25 MG tablet   atorvastatin (LIPITOR) 80 MG tablet      I have changed Ms. Valek's chlorthalidone. I am also having her maintain her fluticasone, psyllium, cetirizine,  norgestrel-ethinyl estradiol, omeprazole, ezetimibe, ranitidine, potassium chloride SA, metoprolol succinate, lisinopril, and atorvastatin.  Meds ordered this encounter  Medications  . metoprolol succinate (TOPROL-XL) 50 MG 24 hr tablet    Sig: Take 1 tablet (50 mg total) by mouth daily. Take with or immediately following a meal.    Dispense:  90 tablet    Refill:  2  . lisinopril (PRINIVIL,ZESTRIL) 20 MG tablet    Sig: Take 1 tablet (20 mg total) by mouth daily.    Dispense:  90 tablet    Refill:  2  . chlorthalidone (HYGROTON) 25 MG tablet    Sig: Take 1 tablet (25 mg total) by mouth daily.    Dispense:  90 tablet    Refill:  2  . atorvastatin (LIPITOR) 80 MG tablet    Sig: Take 1 tablet (80 mg total) by mouth daily.    Dispense:  90 tablet    Refill:  2     Danise Edge, MD

## 2015-06-13 NOTE — Assessment & Plan Note (Signed)
Avoid offending foods, take probiotics. Do not eat large meals in late evening and consider raising head of bed.  

## 2015-06-15 ENCOUNTER — Encounter: Payer: BLUE CROSS/BLUE SHIELD | Admitting: Family Medicine

## 2015-07-03 ENCOUNTER — Ambulatory Visit (INDEPENDENT_AMBULATORY_CARE_PROVIDER_SITE_OTHER): Payer: BLUE CROSS/BLUE SHIELD | Admitting: Family Medicine

## 2015-07-03 ENCOUNTER — Other Ambulatory Visit: Payer: Self-pay | Admitting: Family Medicine

## 2015-07-03 VITALS — BP 131/86 | HR 96

## 2015-07-03 DIAGNOSIS — I10 Essential (primary) hypertension: Secondary | ICD-10-CM

## 2015-07-03 DIAGNOSIS — E876 Hypokalemia: Secondary | ICD-10-CM

## 2015-07-03 LAB — COMPREHENSIVE METABOLIC PANEL
ALBUMIN: 4 g/dL (ref 3.5–5.2)
ALK PHOS: 49 U/L (ref 39–117)
ALT: 18 U/L (ref 0–35)
AST: 23 U/L (ref 0–37)
BUN: 18 mg/dL (ref 6–23)
CO2: 29 mEq/L (ref 19–32)
CREATININE: 0.87 mg/dL (ref 0.40–1.20)
Calcium: 9.6 mg/dL (ref 8.4–10.5)
Chloride: 97 mEq/L (ref 96–112)
GFR: 73.23 mL/min (ref >60.00)
Glucose, Bld: 143 mg/dL — ABNORMAL HIGH (ref 70–99)
POTASSIUM: 2.9 meq/L — AB (ref 3.5–5.1)
SODIUM: 136 meq/L (ref 135–145)
TOTAL PROTEIN: 7.3 g/dL (ref 6.0–8.3)
Total Bilirubin: 0.8 mg/dL (ref 0.2–1.2)

## 2015-07-03 MED ORDER — TRIAMTERENE-HCTZ 37.5-25 MG PO TABS: 1.0000 | ORAL_TABLET | Freq: Every day | ORAL | Status: DC

## 2015-07-03 MED ORDER — POTASSIUM CHLORIDE CRYS ER 20 MEQ PO TBCR: EXTENDED_RELEASE_TABLET | ORAL | Status: DC

## 2015-07-03 MED FILL — POTASSIUM CL ER 20 MEQ TABL: 20 | 10 days supply | Qty: 15 | Fill #0

## 2015-07-03 MED FILL — TRIAMTERENE/HCTZ 37.5/25 TB: 37.5-25 | 30 days supply | Qty: 30 | Fill #0

## 2015-07-03 NOTE — Progress Notes (Signed)
RN blood check note reviewed. Agree with documention and plan. 

## 2015-07-03 NOTE — Progress Notes (Signed)
Pre visit review using our clinic review tool, if applicable. No additional management support is needed unless otherwise documented below in the visit note.  Per 06/12/15 AVS: Return in about 3 weeks (around 07/03/2015) for f/u with nurse for bp.  Pt reports compliance w/ BP medications.  Per Dr. Abner Greenspan: Continue current medications. CMP today. Follow up as scheduled.   Pt aware of instructions and verbalized understanding. Pt escorted to lab and lab orders placed. Pt is scheduled for CPE May 2017.   Starla Link, RN   RN blood check note reviewed. Agree with documention and plan.

## 2015-07-04 MED FILL — METOPROLOL SUCC ER 50 MG TA: 50 | 90 days supply | Qty: 90 | Fill #0

## 2015-07-17 ENCOUNTER — Other Ambulatory Visit: Payer: Self-pay | Admitting: Family Medicine

## 2015-07-17 ENCOUNTER — Other Ambulatory Visit (INDEPENDENT_AMBULATORY_CARE_PROVIDER_SITE_OTHER): Payer: BLUE CROSS/BLUE SHIELD

## 2015-07-17 DIAGNOSIS — E876 Hypokalemia: Secondary | ICD-10-CM

## 2015-07-17 LAB — COMPREHENSIVE METABOLIC PANEL
ALT: 26 U/L (ref 0–35)
AST: 34 U/L (ref 0–37)
Albumin: 4 g/dL (ref 3.5–5.2)
Alkaline Phosphatase: 53 U/L (ref 39–117)
BILIRUBIN TOTAL: 1.1 mg/dL (ref 0.2–1.2)
BUN: 21 mg/dL (ref 6–23)
CALCIUM: 9.5 mg/dL (ref 8.4–10.5)
CHLORIDE: 102 meq/L (ref 96–112)
CO2: 24 meq/L (ref 19–32)
Creatinine, Ser: 1 mg/dL (ref 0.40–1.20)
GFR: 62.35 mL/min (ref >60.00)
Glucose, Bld: 112 mg/dL — ABNORMAL HIGH (ref 70–99)
Potassium: 3.9 mEq/L (ref 3.5–5.1)
Sodium: 134 mEq/L — ABNORMAL LOW (ref 135–145)
Total Protein: 7.2 g/dL (ref 6.0–8.3)

## 2015-07-17 MED ORDER — POTASSIUM CHLORIDE CRYS ER 20 MEQ PO TBCR: 20.0000 meq | EXTENDED_RELEASE_TABLET | Freq: Every day | ORAL | Status: DC

## 2015-07-17 MED FILL — POTASSIUM CL ER 20 MEQ TABL: 20 | 30 days supply | Qty: 30 | Fill #0

## 2015-07-30 MED FILL — TRIAMTERENE/HCTZ 37.5/25 TB: 37.5-25 | 30 days supply | Qty: 30 | Fill #1

## 2015-08-10 ENCOUNTER — Ambulatory Visit (INDEPENDENT_AMBULATORY_CARE_PROVIDER_SITE_OTHER): Payer: BLUE CROSS/BLUE SHIELD | Admitting: Family Medicine

## 2015-08-10 ENCOUNTER — Encounter: Payer: Self-pay | Admitting: Family Medicine

## 2015-08-10 VITALS — BP 108/72 | HR 84 | Temp 95.0°F | Ht 63.5 in | Wt 155.4 lb

## 2015-08-10 DIAGNOSIS — M25552 Pain in left hip: Secondary | ICD-10-CM

## 2015-08-10 DIAGNOSIS — K219 Gastro-esophageal reflux disease without esophagitis: Secondary | ICD-10-CM | POA: Diagnosis not present

## 2015-08-10 DIAGNOSIS — E876 Hypokalemia: Secondary | ICD-10-CM

## 2015-08-10 DIAGNOSIS — R739 Hyperglycemia, unspecified: Secondary | ICD-10-CM

## 2015-08-10 DIAGNOSIS — E119 Type 2 diabetes mellitus without complications: Secondary | ICD-10-CM

## 2015-08-10 DIAGNOSIS — T7840XD Allergy, unspecified, subsequent encounter: Secondary | ICD-10-CM

## 2015-08-10 DIAGNOSIS — Z0000 Encounter for general adult medical examination without abnormal findings: Secondary | ICD-10-CM

## 2015-08-10 DIAGNOSIS — I10 Essential (primary) hypertension: Secondary | ICD-10-CM

## 2015-08-10 DIAGNOSIS — M25551 Pain in right hip: Secondary | ICD-10-CM

## 2015-08-10 DIAGNOSIS — E785 Hyperlipidemia, unspecified: Secondary | ICD-10-CM

## 2015-08-10 HISTORY — DX: Type 2 diabetes mellitus without complications: E11.9

## 2015-08-10 HISTORY — DX: Hyperglycemia, unspecified: R73.9

## 2015-08-10 LAB — COMPREHENSIVE METABOLIC PANEL
ALK PHOS: 51 U/L (ref 39–117)
ALT: 22 U/L (ref 0–35)
AST: 24 U/L (ref 0–37)
Albumin: 4.2 g/dL (ref 3.5–5.2)
BILIRUBIN TOTAL: 0.9 mg/dL (ref 0.2–1.2)
BUN: 20 mg/dL (ref 6–23)
CO2: 24 meq/L (ref 19–32)
Calcium: 10 mg/dL (ref 8.4–10.5)
Chloride: 104 mEq/L (ref 96–112)
Creatinine, Ser: 0.99 mg/dL (ref 0.40–1.20)
GFR: 63.06 mL/min (ref >60.00)
GLUCOSE: 73 mg/dL (ref 70–99)
Potassium: 4.6 mEq/L (ref 3.5–5.1)
SODIUM: 137 meq/L (ref 135–145)
TOTAL PROTEIN: 7.3 g/dL (ref 6.0–8.3)

## 2015-08-10 LAB — TSH: TSH: 1.66 u[IU]/mL (ref 0.35–4.50)

## 2015-08-10 LAB — CBC
HCT: 41 % (ref 36.0–46.0)
Hemoglobin: 13.7 g/dL (ref 12.0–15.0)
MCHC: 33.4 g/dL (ref 30.0–36.0)
MCV: 88 fl (ref 78.0–100.0)
PLATELETS: 316 10*3/uL (ref 150.0–400.0)
RBC: 4.65 Mil/uL (ref 3.87–5.11)
RDW: 14.2 % (ref 11.5–15.5)
WBC: 8.3 10*3/uL (ref 4.0–10.5)

## 2015-08-10 LAB — LIPID PANEL
CHOLESTEROL: 265 mg/dL — AB (ref 0–200)
HDL: 34.8 mg/dL — ABNORMAL LOW (ref >39.00)
NonHDL: 230.37
TRIGLYCERIDES: 263 mg/dL — AB (ref 0.0–149.0)
Total CHOL/HDL Ratio: 8
VLDL: 52.6 mg/dL — ABNORMAL HIGH (ref 0.0–40.0)

## 2015-08-10 LAB — HEMOGLOBIN A1C: Hgb A1c MFr Bld: 6.2 % (ref 4.6–6.5)

## 2015-08-10 LAB — LDL CHOLESTEROL, DIRECT: Direct LDL: 184 mg/dL

## 2015-08-10 NOTE — Progress Notes (Signed)
Pre visit review using our clinic review tool, if applicable. No additional management support is needed unless otherwise documented below in the visit note. 

## 2015-08-10 NOTE — Patient Instructions (Signed)
[employer] at [email or web address] 10 strain caps daily can get at [facility], Female A healthy lifestyle and preventive care can promote health and wellness. Preventive health guidelines for women include the following key practices.  A routine yearly physical is a good way to check with your health care provider about your health and preventive screening. It is a chance to share any concerns and updates on your health and to receive a thorough exam.  Visit your dentist for a routine exam and preventive care every 6 months. Brush your teeth twice a day and floss once a day. Good oral hygiene prevents tooth decay and gum disease.  The frequency of eye exams is based on your age, health, family medical history, use of contact lenses, and other factors. Follow your health care provider's recommendations for frequency of eye exams.  Eat a healthy diet. Foods like vegetables, fruits, whole grains, low-fat dairy products, and lean protein foods contain the nutrients you need without too many calories. Decrease your intake of foods high in solid fats, added sugars, and salt. Eat the right amount of calories for you.Get information about a proper diet from your health care provider, if necessary.  Regular physical exercise is one of the most important things you can do for your health. Most adults should get at least 150 minutes of moderate-intensity exercise (any activity that increases your heart rate and causes you to sweat) each week. In addition, most adults need muscle-strengthening exercises on 2 or more days a week.  Maintain a healthy weight. The body mass index (BMI) is a screening tool to identify possible weight problems. It provides an estimate of body fat based on height and weight. Your health care provider can find your BMI and can help you achieve or maintain a healthy weight.For adults 20 years and older:  A BMI below 18.5 is considered  underweight.  A BMI of 18.5 to 24.9 is normal.  A BMI of 25 to 29.9 is considered overweight.  A BMI of 30 and above is considered obese.  Maintain normal blood lipids and cholesterol levels by exercising and minimizing your intake of saturated fat. Eat a balanced diet with plenty of fruit and vegetables. Blood tests for lipids and cholesterol should begin at age 12 and be repeated every 5 years. If your lipid or cholesterol levels are high, you are over 50, or you are at high risk for heart disease, you may need your cholesterol levels checked more frequently.Ongoing high lipid and cholesterol levels should be treated with medicines if diet and exercise are not working.  If you smoke, find out from your health care provider how to quit. If you do not use tobacco, do not start.  Lung cancer screening is recommended for adults aged 46-80 years who are at high risk for developing lung cancer because of a history of smoking. A yearly low-dose CT scan of the lungs is recommended for people who have at least a 30-pack-year history of smoking and are a current smoker or have quit within the past 15 years. A pack year of smoking is smoking an average of 1 pack of cigarettes a day for 1 year (for example: 1 pack a day for 30 years or 2 packs a day for 15 years). Yearly screening should continue until the smoker has stopped smoking for at least 15 years. Yearly screening should be stopped for people who develop a health problem that would prevent them from having  lung cancer treatment.  If you are pregnant, do not drink alcohol. If you are breastfeeding, be very cautious about drinking alcohol. If you are not pregnant and choose to drink alcohol, do not have more than 1 drink per day. One drink is considered to be 12 ounces (355 mL) of beer, 5 ounces (148 mL) of wine, or 1.5 ounces (44 mL) of liquor.  Avoid use of street drugs. Do not share needles with anyone. Ask for help if you need support or  instructions about stopping the use of drugs.  High blood pressure causes heart disease and increases the risk of stroke. Your blood pressure should be checked at least every 1 to 2 years. Ongoing high blood pressure should be treated with medicines if weight loss and exercise do not work.  If you are 25-78 years old, ask your health care provider if you should take aspirin to prevent strokes.  Diabetes screening is done by taking a blood sample to check your blood glucose level after you have not eaten for a certain period of time (fasting). If you are not overweight and you do not have risk factors for diabetes, you should be screened once every 3 years starting at age 55. If you are overweight or obese and you are 23-10 years of age, you should be screened for diabetes every year as part of your cardiovascular risk assessment.  Breast cancer screening is essential preventive care for women. You should practice "breast self-awareness." This means understanding the normal appearance and feel of your breasts and may include breast self-examination. Any changes detected, no matter how small, should be reported to a health care provider. Women in their 44s and 30s should have a clinical breast exam (CBE) by a health care provider as part of a regular health exam every 1 to 3 years. After age 9, women should have a CBE every year. Starting at age 19, women should consider having a mammogram (breast X-ray test) every year. Women who have a family history of breast cancer should talk to their health care provider about genetic screening. Women at a high risk of breast cancer should talk to their health care providers about having an MRI and a mammogram every year.  Breast cancer gene (BRCA)-related cancer risk assessment is recommended for women who have family members with BRCA-related cancers. BRCA-related cancers include breast, ovarian, tubal, and peritoneal cancers. Having family members with these  cancers may be associated with an increased risk for harmful changes (mutations) in the breast cancer genes BRCA1 and BRCA2. Results of the assessment will determine the need for genetic counseling and BRCA1 and BRCA2 testing.  Your health care provider may recommend that you be screened regularly for cancer of the pelvic organs (ovaries, uterus, and vagina). This screening involves a pelvic examination, including checking for microscopic changes to the surface of your cervix (Pap test). You may be encouraged to have this screening done every 3 years, beginning at age 39.  For women ages 37-65, health care providers may recommend pelvic exams and Pap testing every 3 years, or they may recommend the Pap and pelvic exam, combined with testing for human papilloma virus (HPV), every 5 years. Some types of HPV increase your risk of cervical cancer. Testing for HPV may also be done on women of any age with unclear Pap test results.  Other health care providers may not recommend any screening for nonpregnant women who are considered low risk for pelvic cancer and who do not  have symptoms. Ask your health care provider if a screening pelvic exam is right for you.  If you have had past treatment for cervical cancer or a condition that could lead to cancer, you need Pap tests and screening for cancer for at least 20 years after your treatment. If Pap tests have been discontinued, your risk factors (such as having a new sexual partner) need to be reassessed to determine if screening should resume. Some women have medical problems that increase the chance of getting cervical cancer. In these cases, your health care provider may recommend more frequent screening and Pap tests.  Colorectal cancer can be detected and often prevented. Most routine colorectal cancer screening begins at the age of 66 years and continues through age 104 years. However, your health care provider may recommend screening at an earlier age if you  have risk factors for colon cancer. On a yearly basis, your health care provider may provide home test kits to check for hidden blood in the stool. Use of a small camera at the end of a tube, to directly examine the colon (sigmoidoscopy or colonoscopy), can detect the earliest forms of colorectal cancer. Talk to your health care provider about this at age 29, when routine screening begins. Direct exam of the colon should be repeated every 5-10 years through age 19 years, unless early forms of precancerous polyps or small growths are found.  People who are at an increased risk for hepatitis B should be screened for this virus. You are considered at high risk for hepatitis B if:  You were born in a country where hepatitis B occurs often. Talk with your health care provider about which countries are considered high risk.  Your parents were born in a high-risk country and you have not received a shot to protect against hepatitis B (hepatitis B vaccine).  You have HIV or AIDS.  You use needles to inject street drugs.  You live with, or have sex with, someone who has hepatitis B.  You get hemodialysis treatment.  You take certain medicines for conditions like cancer, organ transplantation, and autoimmune conditions.  Hepatitis C blood testing is recommended for all people born from [birth year] through 1965 and any individual with known risks for hepatitis C.  Practice safe sex. Use condoms and avoid high-risk sexual practices to reduce the spread of sexually transmitted infections (STIs). STIs include gonorrhea, chlamydia, syphilis, trichomonas, herpes, HPV, and human immunodeficiency virus (HIV). Herpes, HIV, and HPV are viral illnesses that have no cure. They can result in disability, cancer, and death.  You should be screened for sexually transmitted illnesses (STIs) including gonorrhea and chlamydia if:  You are sexually active and are younger than 24 years.  You are older than 24 years and your  health care provider tells you that you are at risk for this type of infection.  Your sexual activity has changed since you were last screened and you are at an increased risk for chlamydia or gonorrhea. Ask your health care provider if you are at risk.  If you are at risk of being infected with HIV, it is recommended that you take a prescription medicine daily to prevent HIV infection. This is called preexposure prophylaxis (PrEP). You are considered at risk if:  You are sexually active and do not regularly use condoms or know the HIV status of your partner(s).  You take drugs by injection.  You are sexually active with a partner who has HIV.  Talk with your health  care provider about whether you are at high risk of being infected with HIV. If you choose to begin PrEP, you should first be tested for HIV. You should then be tested every 3 months for as long as you are taking PrEP.  Osteoporosis is a disease in which the bones lose minerals and strength with aging. This can result in serious bone fractures or breaks. The risk of osteoporosis can be identified using a bone density scan. Women ages 49 years and over and women at risk for fractures or osteoporosis should discuss screening with their health care providers. Ask your health care provider whether you should take a calcium supplement or vitamin D to reduce the rate of osteoporosis.  Menopause can be associated with physical symptoms and risks. Hormone replacement therapy is available to decrease symptoms and risks. You should talk to your health care provider about whether hormone replacement therapy is right for you.  Use sunscreen. Apply sunscreen liberally and repeatedly throughout the day. You should seek shade when your shadow is shorter than you. Protect yourself by wearing long sleeves, pants, a wide-brimmed hat, and sunglasses year round, whenever you are outdoors.  Once a month, do a whole body skin exam, using a mirror to look  at the skin on your back. Tell your health care provider of new moles, moles that have irregular borders, moles that are larger than a pencil eraser, or moles that have changed in shape or color.  Stay current with required vaccines (immunizations).  Influenza vaccine. All adults should be immunized every year.  Tetanus, diphtheria, and acellular pertussis (Td, Tdap) vaccine. Pregnant women should receive 1 dose of Tdap vaccine during each pregnancy. The dose should be obtained regardless of the length of time since the last dose. Immunization is preferred during the 27th-36th week of gestation. An adult who has not previously received Tdap or who does not know her vaccine status should receive 1 dose of Tdap. This initial dose should be followed by tetanus and diphtheria toxoids (Td) booster doses every 10 years. Adults with an unknown or incomplete history of completing a 3-dose immunization series with Td-containing vaccines should begin or complete a primary immunization series including a Tdap dose. Adults should receive a Td booster every 10 years.  Varicella vaccine. An adult without evidence of immunity to varicella should receive 2 doses or a second dose if she has previously received 1 dose. Pregnant females who do not have evidence of immunity should receive the first dose after pregnancy. This first dose should be obtained before leaving the health care facility. The second dose should be obtained 4-8 weeks after the first dose.  Human papillomavirus (HPV) vaccine. Females aged 13-26 years who have not received the vaccine previously should obtain the 3-dose series. The vaccine is not recommended for use in pregnant females. However, pregnancy testing is not needed before receiving a dose. If a female is found to be pregnant after receiving a dose, no treatment is needed. In that case, the remaining doses should be delayed until after the pregnancy. Immunization is recommended for any person  with an immunocompromised condition through the age of 37 years if she did not get any or all doses earlier. During the 3-dose series, the second dose should be obtained 4-8 weeks after the first dose. The third dose should be obtained 24 weeks after the first dose and 16 weeks after the second dose.  Zoster vaccine. One dose is recommended for adults aged 67 years or  older unless certain conditions are present.  Measles, mumps, and rubella (MMR) vaccine. Adults born before [birth year] generally are considered immune to measles and mumps. Adults born in [birth year] or later should have 1 or more doses of MMR vaccine unless there is a contraindication to the vaccine or there is laboratory evidence of immunity to each of the three diseases. A routine second dose of MMR vaccine should be obtained at least 28 days after the first dose for students attending postsecondary schools, health care workers, or international travelers. People who received inactivated measles vaccine or an unknown type of measles vaccine during 1963-1967 should receive 2 doses of MMR vaccine. People who received inactivated mumps vaccine or an unknown type of mumps vaccine before 1979 and are at high risk for mumps infection should consider immunization with 2 doses of MMR vaccine. For females of childbearing age, rubella immunity should be determined. If there is no evidence of immunity, females who are not pregnant should be vaccinated. If there is no evidence of immunity, females who are pregnant should delay immunization until after pregnancy. Unvaccinated health care workers born before [birth year] who lack laboratory evidence of measles, mumps, or rubella immunity or laboratory confirmation of disease should consider measles and mumps immunization with 2 doses of MMR vaccine or rubella immunization with 1 dose of MMR vaccine.  Pneumococcal 13-valent conjugate (PCV13) vaccine. When indicated, a person who is uncertain of his immunization history and has  no record of immunization should receive the PCV13 vaccine. All adults 59 years of age and older should receive this vaccine. An adult aged 19 years or older who has certain medical conditions and has not been previously immunized should receive 1 dose of PCV13 vaccine. This PCV13 should be followed with a dose of pneumococcal polysaccharide (PPSV23) vaccine. Adults who are at high risk for pneumococcal disease should obtain the PPSV23 vaccine at least 8 weeks after the dose of PCV13 vaccine. Adults older than 50 years of age who have normal immune system function should obtain the PPSV23 vaccine dose at least 1 year after the dose of PCV13 vaccine.  Pneumococcal polysaccharide (PPSV23) vaccine. When PCV13 is also indicated, PCV13 should be obtained first. All adults aged 61 years and older should be immunized. An adult younger than age 34 years who has certain medical conditions should be immunized. Any person who resides in a nursing home or long-term care facility should be immunized. An adult smoker should be immunized. People with an immunocompromised condition and certain other conditions should receive both PCV13 and PPSV23 vaccines. People with human immunodeficiency virus (HIV) infection should be immunized as soon as possible after diagnosis. Immunization during chemotherapy or radiation therapy should be avoided. Routine use of PPSV23 vaccine is not recommended for American Indians, [place]atives, or people younger than 65 years unless there are medical conditions that require PPSV23 vaccine. When indicated, people who have unknown immunization and have no record of immunization should receive PPSV23 vaccine. One-time revaccination 5 years after the first dose of PPSV23 is recommended for people aged 19-64 years who have chronic kidney failure, nephrotic syndrome, asplenia, or immunocompromised conditions. People who received 1-2 doses of PPSV23 before age 67 years should receive another dose of PPSV23  vaccine at age 41 years or later if at least 5 years have passed since the previous dose. Doses of PPSV23 are not needed for people immunized with PPSV23 at or after age 17 years.  Meningococcal vaccine. Adults with asplenia or persistent complement component deficiencies  should receive 2 doses of quadrivalent meningococcal conjugate (MenACWY-D) vaccine. The doses should be obtained at least 2 months apart. Microbiologists working with certain meningococcal bacteria, [place] recruits, people at risk during an outbreak, and people who travel to or live in countries with a high rate of meningitis should be immunized. A first-year college student up through age 89 years who is living in a residence hall should receive a dose if she did not receive a dose on or after her 16th birthday. Adults who have certain high-risk conditions should receive one or more doses of vaccine.  Hepatitis A vaccine. Adults who wish to be protected from this disease, have certain high-risk conditions, work with hepatitis A-infected animals, work in hepatitis A research labs, or travel to or work in countries with a high rate of hepatitis A should be immunized. Adults who were previously unvaccinated and who anticipate close contact with an international adoptee during the first 60 days after arrival in the [place] from a country with a high rate of hepatitis A should be immunized.  Hepatitis B vaccine. Adults who wish to be protected from this disease, have certain high-risk conditions, may be exposed to blood or other infectious body fluids, are household contacts or sex partners of hepatitis B positive people, are clients or workers in certain care facilities, or travel to or work in countries with a high rate of hepatitis B should be immunized.  Haemophilus influenzae type b (Hib) vaccine. A previously unvaccinated person with asplenia or sickle cell disease or having a scheduled splenectomy should receive 1 dose of Hib  vaccine. Regardless of previous immunization, a recipient of a hematopoietic stem cell transplant should receive a 3-dose series 6-12 months after her successful transplant. Hib vaccine is not recommended for adults with HIV infection. Preventive Services / Frequency Ages 35 to 27 years  Blood pressure check.** / Every 3-5 years.  Lipid and cholesterol check.** / Every 5 years beginning at age 90.  Clinical breast exam.** / Every 3 years for women in their 76s and 80s.  BRCA-related cancer risk assessment.** / For women who have family members with a BRCA-related cancer (breast, ovarian, tubal, or peritoneal cancers).  Pap test.** / Every 2 years from ages 67 through 33. Every 3 years starting at age 45 through age 68 or 45 with a history of 3 consecutive normal Pap tests.  HPV screening.** / Every 3 years from ages 20 through ages 29 to 67 with a history of 3 consecutive normal Pap tests.  Hepatitis C blood test.** / For any individual with known risks for hepatitis C.  Skin self-exam. / Monthly.  Influenza vaccine. / Every year.  Tetanus, diphtheria, and acellular pertussis (Tdap, Td) vaccine.** / Consult your health care provider. Pregnant women should receive 1 dose of Tdap vaccine during each pregnancy. 1 dose of Td every 10 years.  Varicella vaccine.** / Consult your health care provider. Pregnant females who do not have evidence of immunity should receive the first dose after pregnancy.  HPV vaccine. / 3 doses over 6 months, if 74 and younger. The vaccine is not recommended for use in pregnant females. However, pregnancy testing is not needed before receiving a dose.  Measles, mumps, rubella (MMR) vaccine.** / You need at least 1 dose of MMR if you were born in 1957 or later. You may also need a 2nd dose. For females of childbearing age, rubella immunity should be determined. If there is no evidence of immunity, females who are  not pregnant should be vaccinated. If there is no  evidence of immunity, females who are pregnant should delay immunization until after pregnancy.  Pneumococcal 13-valent conjugate (PCV13) vaccine.** / Consult your health care provider.  Pneumococcal polysaccharide (PPSV23) vaccine.** / 1 to 2 doses if you smoke cigarettes or if you have certain conditions.  Meningococcal vaccine.** / 1 dose if you are age 66 to 38 years and a Market researcher living in a residence hall, or have one of several medical conditions, you need to get vaccinated against meningococcal disease. You may also need additional booster doses.  Hepatitis A vaccine.** / Consult your health care provider.  Hepatitis B vaccine.** / Consult your health care provider.  Haemophilus influenzae type b (Hib) vaccine.** / Consult your health care provider. Ages 77 to 15 years  Blood pressure check.** / Every year.  Lipid and cholesterol check.** / Every 5 years beginning at age 70 years.  Lung cancer screening. / Every year if you are aged 92-80 years and have a 30-pack-year history of smoking and currently smoke or have quit within the past 15 years. Yearly screening is stopped once you have quit smoking for at least 15 years or develop a health problem that would prevent you from having lung cancer treatment.  Clinical breast exam.** / Every year after age 59 years.  BRCA-related cancer risk assessment.** / For women who have family members with a BRCA-related cancer (breast, ovarian, tubal, or peritoneal cancers).  Mammogram.** / Every year beginning at age 17 years and continuing for as long as you are in good health. Consult with your health care provider.  Pap test.** / Every 3 years starting at age 30 years through age 45 or 43 years with a history of 3 consecutive normal Pap tests.  HPV screening.** / Every 3 years from ages 64 years through ages 41 to 68 years with a history of 3 consecutive normal Pap tests.  Fecal occult blood test (FOBT) of stool. /  Every year beginning at age 71 years and continuing until age 19 years. You may not need to do this test if you get a colonoscopy every 10 years.  Flexible sigmoidoscopy or colonoscopy.** / Every 5 years for a flexible sigmoidoscopy or every 10 years for a colonoscopy beginning at age 1 years and continuing until age 53 years.  Hepatitis C blood test.** / For all people born from [birth year] through 1965 and any individual with known risks for hepatitis C.  Skin self-exam. / Monthly.  Influenza vaccine. / Every year.  Tetanus, diphtheria, and acellular pertussis (Tdap/Td) vaccine.** / Consult your health care provider. Pregnant women should receive 1 dose of Tdap vaccine during each pregnancy. 1 dose of Td every 10 years.  Varicella vaccine.** / Consult your health care provider. Pregnant females who do not have evidence of immunity should receive the first dose after pregnancy.  Zoster vaccine.** / 1 dose for adults aged 17 years or older.  Measles, mumps, rubella (MMR) vaccine.** / You need at least 1 dose of MMR if you were born in 1957 or later. You may also need a second dose. For females of childbearing age, rubella immunity should be determined. If there is no evidence of immunity, females who are not pregnant should be vaccinated. If there is no evidence of immunity, females who are pregnant should delay immunization until after pregnancy.  Pneumococcal 13-valent conjugate (PCV13) vaccine.** / Consult your health care provider.  Pneumococcal polysaccharide (PPSV23) vaccine.** / 1 to 2  doses if you smoke cigarettes or if you have certain conditions.  Meningococcal vaccine.** / Consult your health care provider.  Hepatitis A vaccine.** / Consult your health care provider.  Hepatitis B vaccine.** / Consult your health care provider.  Haemophilus influenzae type b (Hib) vaccine.** / Consult your health care provider. Ages 38 years and over  Blood pressure check.** / Every year.  Lipid  and cholesterol check.** / Every 5 years beginning at age 77 years.  Lung cancer screening. / Every year if you are aged 31-80 years and have a 30-pack-year history of smoking and currently smoke or have quit within the past 15 years. Yearly screening is stopped once you have quit smoking for at least 15 years or develop a health problem that would prevent you from having lung cancer treatment.  Clinical breast exam.** / Every year after age 81 years.  BRCA-related cancer risk assessment.** / For women who have family members with a BRCA-related cancer (breast, ovarian, tubal, or peritoneal cancers).  Mammogram.** / Every year beginning at age 27 years and continuing for as long as you are in good health. Consult with your health care provider.  Pap test.** / Every 3 years starting at age 48 years through age 27 or 73 years with 3 consecutive normal Pap tests. Testing can be stopped between 65 and 70 years with 3 consecutive normal Pap tests and no abnormal Pap or HPV tests in the past 10 years.  HPV screening.** / Every 3 years from ages 25 years through ages 7 or 38 years with a history of 3 consecutive normal Pap tests. Testing can be stopped between 65 and 70 years with 3 consecutive normal Pap tests and no abnormal Pap or HPV tests in the past 10 years.  Fecal occult blood test (FOBT) of stool. / Every year beginning at age 59 years and continuing until age 78 years. You may not need to do this test if you get a colonoscopy every 10 years.  Flexible sigmoidoscopy or colonoscopy.** / Every 5 years for a flexible sigmoidoscopy or every 10 years for a colonoscopy beginning at age 32 years and continuing until age 106 years.  Hepatitis C blood test.** / For all people born from [birth year] through 1965 and any individual with known risks for hepatitis C.  Osteoporosis screening.** / A one-time screening for women ages 33 years and over and women at risk for fractures or osteoporosis.  Skin  self-exam. / Monthly.  Influenza vaccine. / Every year.  Tetanus, diphtheria, and acellular pertussis (Tdap/Td) vaccine.** / 1 dose of Td every 10 years.  Varicella vaccine.** / Consult your health care provider.  Zoster vaccine.** / 1 dose for adults aged 41 years or older.  Pneumococcal 13-valent conjugate (PCV13) vaccine.** / Consult your health care provider.  Pneumococcal polysaccharide (PPSV23) vaccine.** / 1 dose for all adults aged 50 years and older.  Meningococcal vaccine.** / Consult your health care provider.  Hepatitis A vaccine.** / Consult your health care provider.  Hepatitis B vaccine.** / Consult your health care provider.  Haemophilus influenzae type b (Hib) vaccine.** / Consult your health care provider. ** Family history and personal history of risk and conditions may change your health care provider's recommendations.   This information is not intended to replace advice given to you by your health care provider. Make sure you discuss any questions you have with your health care provider.   Document Released: 04/29/2001 Document Revised: 03/24/2014 Document Reviewed: 07/29/2010 Elsevier Interactive Patient Education  2016 [employer].

## 2015-08-19 ENCOUNTER — Encounter: Payer: Self-pay | Admitting: Family Medicine

## 2015-08-19 DIAGNOSIS — M25552 Pain in left hip: Secondary | ICD-10-CM

## 2015-08-19 DIAGNOSIS — M25551 Pain in right hip: Secondary | ICD-10-CM

## 2015-08-19 HISTORY — DX: Pain in right hip: M25.551

## 2015-08-19 NOTE — Assessment & Plan Note (Signed)
Encouraged heart healthy diet, increase exercise, avoid trans fats, consider a krill oil cap daily 

## 2015-08-19 NOTE — Assessment & Plan Note (Signed)
Patient encouraged to maintain heart healthy diet, regular exercise, adequate sleep. Consider daily probiotics. Take medications as prescribed. Given and reviewed copy of ACP documents from [place] Secretary [employer] State and encouraged to complete and return 

## 2015-08-19 NOTE — Assessment & Plan Note (Signed)
minimize simple carbs. Increase exercise as tolerated.  

## 2015-08-19 NOTE — Progress Notes (Signed)
Patient ID: Marissa Guzman, female   DOB: February 21, 1966, 50 y.o.   MRN: [id number]   Subjective:    Patient ID: Marissa Guzman, female    DOB: April 09, 1965, 50 y.o.   MRN: [id number]  Chief Complaint  Patient presents with  . Annual Exam    HPI Patient is in today for annual exam. Is feelin well at this time. She is noting b/l hip and knee pain and stiffness. Worse in am and after prolonged sitting. No injury or falls. No acute illness or hospitalization. Denies CP/palp/SOB/HA/congestion/fevers/GI or GU c/o. Taking meds as prescribed  Past Medical History  Diagnosis Date  . HTN (hypertension)   . Hyperlipidemia   . Seasonal allergies   . Acute bronchitis 05/16/2012  . Allergic state 05/16/2012  . Hypokalemia 11/14/2012  . GERD (gastroesophageal reflux disease) 06/12/2013  . Constipation 04/16/2014  . Hyperglycemia 08/10/2015  . Hip pain, bilateral 08/19/2015    Past Surgical History  Procedure Laterality Date  . Hemorrhoid surgery      two  . Cesarean section      Family History  Problem Relation Age of Onset  . Heart disease Father     Deceased  . Hypertension Father   . Lung cancer Father   . Melanoma Father     Eye Removal  . Hyperlipidemia Mother   . Hypertension Mother   . Hyperlipidemia Maternal Grandmother   . Diabetes Maternal Grandmother   . Dementia Maternal Grandmother   . Hypertension Sister     x2  . Hyperlipidemia Sister     x2    Social History   Social History  . Marital Status: Married    Spouse Name: N/A  . Number of Children: N/A  . Years of Education: N/A   Occupational History  . Not on file.   Social History Main Topics  . Smoking status: Never Smoker   . Smokeless tobacco: Not on file  . Alcohol Use: No  . Drug Use: No  . 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 Not on file   Social History Narrative     Outpatient Prescriptions Prior to Visit  Medication Sig Dispense Refill  . atorvastatin (LIPITOR) 80 MG tablet Take 1 tablet (80 mg total) by mouth daily. 90 tablet 2  . cetirizine (ZYRTEC) 10 MG tablet Take 10 mg by mouth daily as needed.    . fluticasone (FLONASE) 50 MCG/ACT nasal spray Place 2 sprays into the nose daily as needed.    Marland Kitchen lisinopril (PRINIVIL,ZESTRIL) 20 MG tablet Take 1 tablet (20 mg total) by mouth daily. 90 tablet 2  . metoprolol succinate (TOPROL-XL) 50 MG 24 hr tablet Take 1 tablet (50 mg total) by mouth daily. Take with or immediately following a meal. 90 tablet 2  . norgestrel-ethinyl estradiol (LO/OVRAL,CRYSELLE) 0.3-30 MG-MCG tablet Take 1 tablet by mouth daily. 1 Package 0  . omeprazole (PRILOSEC) 20 MG capsule Take 20 mg by mouth daily.    . potassium chloride SA (K-DUR,KLOR-CON) 20 MEQ tablet Take 1 tablet (20 mEq total) by mouth daily. 30 tablet 3  . psyllium (METAMUCIL) 58.6 % powder Take 1 packet by mouth daily.    . ranitidine (ZANTAC) 300 MG tablet Take 1 tablet (300 mg total) by mouth at bedtime. 30 tablet 5  . triamterene-hydrochlorothiazide (MAXZIDE-25) 37.5-25 MG tablet Take 1 tablet by mouth  daily. 30 tablet 3  . ezetimibe (ZETIA) 10 MG tablet Take 1 tablet (10 mg total) by mouth daily. 90 tablet 1   No facility-administered medications prior to visit.    Allergies  Allergen Reactions  . Niacin And Related     Extreme flushing despite taking it correctly  . Tetracyclines & Related Other (See Comments)    GI Issues.    Review of Systems  Constitutional: Negative for fever and malaise/fatigue.  HENT: Negative for congestion.   Eyes: Negative for blurred vision.  Respiratory: Negative for shortness of breath.   Cardiovascular: Negative for chest pain, palpitations and leg swelling.  Gastrointestinal: Negative for nausea, abdominal pain and blood in stool.  Genitourinary: Negative for dysuria and frequency.  Musculoskeletal: Positive for  joint pain. Negative for falls.  Skin: Negative for rash.  Neurological: Negative for dizziness, loss of consciousness and headaches.  Endo/Heme/Allergies: Negative for environmental allergies.  Psychiatric/Behavioral: Negative for depression. The patient is not nervous/anxious.        Objective:    Physical Exam  Constitutional: She is oriented to person, place, and time. She appears well-developed and well-nourished. No distress.  HENT:  Head: Normocephalic and atraumatic.  Eyes: Conjunctivae are normal.  Neck: Neck supple. No thyromegaly present.  Cardiovascular: Normal rate, regular rhythm and normal heart sounds.   No murmur heard. Pulmonary/Chest: Effort normal and breath sounds normal. No respiratory distress.  Abdominal: Soft. Bowel sounds are normal. She exhibits no distension and no mass. There is no tenderness.  Musculoskeletal: She exhibits no edema.  Lymphadenopathy:    She has no cervical adenopathy.  Neurological: She is alert and oriented to person, place, and time.  Skin: Skin is warm and dry.  Psychiatric: She has a normal mood and affect. Her behavior is normal.    BP 108/72 mmHg  Pulse 84  Temp(Src) 95 F (35 C) (Oral)  Ht 5' 3.5" (1.613 m)  Wt 155 lb 6 oz (70.478 kg)  BMI 27.09 kg/m2  SpO2 95% Wt Readings from Last 3 Encounters:  08/10/15 155 lb 6 oz (70.478 kg)  06/12/15 159 lb 6 oz (72.292 kg)  09/26/14 142 lb 6 oz (64.581 kg)     Lab Results  Component Value Date   WBC 8.3 08/10/2015   HGB 13.7 08/10/2015   HCT 41.0 08/10/2015   PLT 316.0 08/10/2015   GLUCOSE 73 08/10/2015   CHOL 265* 08/10/2015   TRIG 263.0* 08/10/2015   HDL 34.80* 08/10/2015   LDLDIRECT 184.0 08/10/2015   LDLCALC 120* 11/03/2014   ALT 22 08/10/2015   AST 24 08/10/2015   NA 137 08/10/2015   K 4.6 08/10/2015   CL 104 08/10/2015   CREATININE 0.99 08/10/2015   BUN 20 08/10/2015   CO2 24 08/10/2015   TSH 1.66 08/10/2015   HGBA1C 6.2 08/10/2015    Lab Results   Component Value Date   TSH 1.66 08/10/2015   Lab Results  Component Value Date   WBC 8.3 08/10/2015   HGB 13.7 08/10/2015   HCT 41.0 08/10/2015   MCV 88.0 08/10/2015   PLT 316.0 08/10/2015   Lab Results  Component Value Date   NA 137 08/10/2015   K 4.6 08/10/2015   CO2 24 08/10/2015   GLUCOSE 73 08/10/2015   BUN 20 08/10/2015   CREATININE 0.99 08/10/2015   BILITOT 0.9 08/10/2015   ALKPHOS 51 08/10/2015   AST 24 08/10/2015   ALT 22 08/10/2015   PROT 7.3 08/10/2015   ALBUMIN 4.2  08/10/2015   CALCIUM 10.0 08/10/2015   GFR 63.06 08/10/2015   Lab Results  Component Value Date   CHOL 265* 08/10/2015   Lab Results  Component Value Date   HDL 34.80* 08/10/2015   Lab Results  Component Value Date   LDLCALC 120* 11/03/2014   Lab Results  Component Value Date   TRIG 263.0* 08/10/2015   Lab Results  Component Value Date   CHOLHDL 8 08/10/2015   Lab Results  Component Value Date   HGBA1C 6.2 08/10/2015       Assessment & Plan:   Problem List Items Addressed This Visit    Preventative health care    Patient encouraged to maintain heart healthy diet, regular exercise, adequate sleep. Consider daily probiotics. Take medications as prescribed. Given and reviewed copy of ACP documents from [employer] and encouraged to complete and return      Relevant Orders   CBC (Completed)   TSH (Completed)   Lipid panel (Completed)   Comprehensive metabolic panel (Completed)   Hemoglobin A1c (Completed)   Hypokalemia   Relevant Orders   CBC (Completed)   TSH (Completed)   Lipid panel (Completed)   Comprehensive metabolic panel (Completed)   Hemoglobin A1c (Completed)   Hyperlipidemia    Encouraged heart healthy diet, increase exercise, avoid trans fats, consider a krill oil cap daily      Relevant Orders   CBC (Completed)   TSH (Completed)   Lipid panel (Completed)   Comprehensive metabolic panel (Completed)   Hemoglobin A1c (Completed)    Hyperglycemia - Primary     minimize simple carbs. Increase exercise as tolerated.       Relevant Orders   CBC (Completed)   TSH (Completed)   Lipid panel (Completed)   Comprehensive metabolic panel (Completed)   Hemoglobin A1c (Completed)   Hip pain, bilateral    And knee pain and stiffness. Better later in the day and worse after sitting. Likely early arthritic changes. Encouraged ongoing physical activity and to keep weight down      GERD (gastroesophageal reflux disease)   Relevant Orders   CBC (Completed)   TSH (Completed)   Lipid panel (Completed)   Comprehensive metabolic panel (Completed)   Hemoglobin A1c (Completed)   Essential hypertension    Well controlled, no changes to meds. Encouraged heart healthy diet such as the DASH diet and exercise as tolerated.       Allergic state   Relevant Orders   CBC (Completed)   TSH (Completed)   Lipid panel (Completed)   Comprehensive metabolic panel (Completed)   Hemoglobin A1c (Completed)      I have discontinued Ms. Warf's ezetimibe. I am also having her maintain her fluticasone, psyllium, cetirizine, norgestrel-ethinyl estradiol, omeprazole, ranitidine, metoprolol succinate, lisinopril, atorvastatin, triamterene-hydrochlorothiazide, and potassium chloride SA.  No orders of the defined types were placed in this encounter.     Danise Edge, MD

## 2015-08-19 NOTE — Assessment & Plan Note (Signed)
And knee pain and stiffness. Better later in the day and worse after sitting. Likely early arthritic changes. Encouraged ongoing physical activity and to keep weight down

## 2015-08-19 NOTE — Assessment & Plan Note (Signed)
Well controlled, no changes to meds. Encouraged heart healthy diet such as the DASH diet and exercise as tolerated.  °

## 2015-08-30 MED FILL — TRIAMTERENE/HCTZ 37.5/25 TB: 37.5-25 | 30 days supply | Qty: 30 | Fill #2

## 2015-09-26 MED FILL — LISINOPRIL 20 MG TABLET: 20 | 90 days supply | Qty: 90 | Fill #1

## 2015-09-26 MED FILL — METOPROLOL SUCC ER 50 MG TA: 50 | 90 days supply | Qty: 90 | Fill #1

## 2015-09-26 MED FILL — POTASSIUM CL ER 20 MEQ TABL: 20 | 30 days supply | Qty: 30 | Fill #1

## 2015-09-26 MED FILL — TRIAMTERENE/HCTZ 37.5/25 TB: 37.5-25 | 30 days supply | Qty: 30 | Fill #3

## 2015-09-26 MED FILL — ATORVASTATIN 80 MG TABLET: 80 | 90 days supply | Qty: 90 | Fill #1

## 2015-11-14 ENCOUNTER — Other Ambulatory Visit: Payer: Self-pay | Admitting: Family Medicine

## 2015-11-14 MED FILL — TRIAMTERENE-HCTZ 37.5-25 MG: 37.5-25 | 30 days supply | Qty: 30 | Fill #0

## 2015-12-12 MED FILL — TRIAMTERENE-HCTZ 37.5-25 MG: 37.5-25 | 30 days supply | Qty: 30 | Fill #1

## 2016-01-01 MED FILL — LISINOPRIL 20 MG TABLET: 20 | 90 days supply | Qty: 90 | Fill #2

## 2016-01-14 MED FILL — TRIAMTERENE-HCTZ 37.5-25 MG: 37.5-25 | 30 days supply | Qty: 30 | Fill #2

## 2016-01-14 MED FILL — METOPROLOL SUCC ER 50 MG TA: 50 | 90 days supply | Qty: 90 | Fill #2

## 2016-01-14 MED FILL — ATORVASTATIN 80 MG TABLET: 80 | 90 days supply | Qty: 90 | Fill #2

## 2016-01-28 ENCOUNTER — Telehealth: Payer: Self-pay | Admitting: Family Medicine

## 2016-01-28 NOTE — Telephone Encounter (Signed)
Pt called in to be advised on If  She will need a medication F/U appt before her cpe that is scheduled for next year in May? Pt said if so she will go ahead and schedule.     Please advise for appt.

## 2016-01-28 NOTE — Telephone Encounter (Signed)
If she is feeling well we can wait til her appt if she is having any concerns then we can see her in Jan or Feb and would do repeat labs at that time.

## 2016-01-29 NOTE — Telephone Encounter (Signed)
lvm for pt advising of what provider responded.    Thanks.

## 2016-02-11 DIAGNOSIS — H1851 Endothelial corneal dystrophy: Secondary | ICD-10-CM | POA: Diagnosis not present

## 2016-02-11 DIAGNOSIS — H04123 Dry eye syndrome of bilateral lacrimal glands: Secondary | ICD-10-CM | POA: Diagnosis not present

## 2016-02-11 DIAGNOSIS — H40013 Open angle with borderline findings, low risk, bilateral: Secondary | ICD-10-CM | POA: Diagnosis not present

## 2016-02-15 MED FILL — TRIAMTERENE-HCTZ 37.5-25 MG: 37.5-25 | 30 days supply | Qty: 30 | Fill #3

## 2016-02-26 DIAGNOSIS — D225 Melanocytic nevi of trunk: Secondary | ICD-10-CM | POA: Diagnosis not present

## 2016-02-26 DIAGNOSIS — L814 Other melanin hyperpigmentation: Secondary | ICD-10-CM | POA: Diagnosis not present

## 2016-02-26 DIAGNOSIS — D1801 Hemangioma of skin and subcutaneous tissue: Secondary | ICD-10-CM | POA: Diagnosis not present

## 2016-02-26 DIAGNOSIS — L821 Other seborrheic keratosis: Secondary | ICD-10-CM | POA: Diagnosis not present

## 2016-03-24 ENCOUNTER — Other Ambulatory Visit: Payer: Self-pay | Admitting: Family Medicine

## 2016-03-24 MED FILL — TRIAMTERENE-HCTZ 37.5-25 MG: 37.5-25 | 30 days supply | Qty: 30 | Fill #0

## 2016-03-24 MED FILL — LISINOPRIL 20 MG TABLET: 20 | 90 days supply | Qty: 90 | Fill #0

## 2016-04-21 ENCOUNTER — Other Ambulatory Visit: Payer: Self-pay | Admitting: Family Medicine

## 2016-04-21 MED FILL — TRIAMTERENE-HCTZ 37.5-25 MG: 37.5-25 | 30 days supply | Qty: 30 | Fill #1

## 2016-04-21 MED FILL — ATORVASTATIN 80 MG TABLET: 80 | 90 days supply | Qty: 90 | Fill #0

## 2016-04-21 MED FILL — METOPROLOL SUCC ER 50 MG TA: 50 | 90 days supply | Qty: 90 | Fill #0

## 2016-04-24 DIAGNOSIS — Z6827 Body mass index (BMI) 27.0-27.9, adult: Secondary | ICD-10-CM | POA: Diagnosis not present

## 2016-04-24 DIAGNOSIS — Z01419 Encounter for gynecological examination (general) (routine) without abnormal findings: Secondary | ICD-10-CM | POA: Diagnosis not present

## 2016-05-19 MED FILL — ELINEST-28 TABLET: 0.3-30 | 63 days supply | Qty: 84 | Fill #0

## 2016-05-27 MED FILL — TRIAMTERENE-HCTZ 37.5-25 MG: 37.5-25 | 30 days supply | Qty: 30 | Fill #2

## 2016-06-30 MED FILL — LISINOPRIL 20 MG TABLET: 20 | 90 days supply | Qty: 90 | Fill #1

## 2016-06-30 MED FILL — TRIAMTERENE-HCTZ 37.5-25 MG: 37.5-25 | 30 days supply | Qty: 30 | Fill #3

## 2016-07-22 ENCOUNTER — Other Ambulatory Visit: Payer: Self-pay | Admitting: Family Medicine

## 2016-07-22 MED FILL — ATORVASTATIN 80 MG TABLET: 80 | 90 days supply | Qty: 90 | Fill #1

## 2016-07-22 MED FILL — METOPROLOL SUCC ER 50 MG TA: 50 | 90 days supply | Qty: 90 | Fill #1

## 2016-07-24 MED FILL — TRIAMTERENE-HCTZ 37.5-25 MG: 37.5-25 | 30 days supply | Qty: 30 | Fill #0

## 2016-07-24 MED FILL — ELINEST-28 TABLET: 0.3-30 | 63 days supply | Qty: 84 | Fill #1

## 2016-08-14 ENCOUNTER — Ambulatory Visit (INDEPENDENT_AMBULATORY_CARE_PROVIDER_SITE_OTHER): Payer: BLUE CROSS/BLUE SHIELD | Admitting: Family Medicine

## 2016-08-14 ENCOUNTER — Encounter: Payer: Self-pay | Admitting: Family Medicine

## 2016-08-14 VITALS — BP 128/73 | HR 95 | Temp 98.2°F | Ht 64.0 in | Wt 163.4 lb

## 2016-08-14 DIAGNOSIS — R739 Hyperglycemia, unspecified: Secondary | ICD-10-CM

## 2016-08-14 DIAGNOSIS — E785 Hyperlipidemia, unspecified: Secondary | ICD-10-CM | POA: Diagnosis not present

## 2016-08-14 DIAGNOSIS — I10 Essential (primary) hypertension: Secondary | ICD-10-CM | POA: Diagnosis not present

## 2016-08-14 DIAGNOSIS — E663 Overweight: Secondary | ICD-10-CM

## 2016-08-14 DIAGNOSIS — Z0000 Encounter for general adult medical examination without abnormal findings: Secondary | ICD-10-CM | POA: Diagnosis not present

## 2016-08-14 DIAGNOSIS — K219 Gastro-esophageal reflux disease without esophagitis: Secondary | ICD-10-CM

## 2016-08-14 HISTORY — DX: Overweight: E66.3

## 2016-08-14 LAB — COMPREHENSIVE METABOLIC PANEL
ALBUMIN: 4 g/dL (ref 3.5–5.2)
ALT: 28 U/L (ref 0–35)
AST: 33 U/L (ref 0–37)
Alkaline Phosphatase: 53 U/L (ref 39–117)
BUN: 16 mg/dL (ref 6–23)
CHLORIDE: 100 meq/L (ref 96–112)
CO2: 26 meq/L (ref 19–32)
Calcium: 9.5 mg/dL (ref 8.4–10.5)
Creatinine, Ser: 0.91 mg/dL (ref 0.40–1.20)
GFR: 69.22 mL/min (ref >60.00)
Glucose, Bld: 86 mg/dL (ref 70–99)
Potassium: 3.5 mEq/L (ref 3.5–5.1)
SODIUM: 136 meq/L (ref 135–145)
Total Bilirubin: 1 mg/dL (ref 0.2–1.2)
Total Protein: 7.2 g/dL (ref 6.0–8.3)

## 2016-08-14 LAB — CBC
HCT: 40.3 % (ref 36.0–46.0)
Hemoglobin: 13.6 g/dL (ref 12.0–15.0)
MCHC: 33.9 g/dL (ref 30.0–36.0)
MCV: 87.8 fl (ref 78.0–100.0)
Platelets: 306 10*3/uL (ref 150.0–400.0)
RBC: 4.59 Mil/uL (ref 3.87–5.11)
RDW: 13.7 % (ref 11.5–15.5)
WBC: 7.3 10*3/uL (ref 4.0–10.5)

## 2016-08-14 LAB — LIPID PANEL
CHOL/HDL RATIO: 7
Cholesterol: 280 mg/dL — ABNORMAL HIGH (ref 0–200)
HDL: 41.5 mg/dL (ref >39.00)
NONHDL: 238.29
Triglycerides: 235 mg/dL — ABNORMAL HIGH (ref 0.0–149.0)
VLDL: 47 mg/dL — ABNORMAL HIGH (ref 0.0–40.0)

## 2016-08-14 LAB — HEMOGLOBIN A1C: Hgb A1c MFr Bld: 6.5 % (ref 4.6–6.5)

## 2016-08-14 LAB — LDL CHOLESTEROL, DIRECT: LDL DIRECT: 198 mg/dL

## 2016-08-14 LAB — TSH: TSH: 1.52 u[IU]/mL (ref 0.35–4.50)

## 2016-08-14 NOTE — Assessment & Plan Note (Signed)
Encouraged heart healthy diet, increase exercise, avoid trans fats, consider a krill oil cap daily 

## 2016-08-14 NOTE — Patient Instructions (Addendum)
Shingrix shingles call insurance and confirm they will cover and then go to pharmacy or call here for shot  MIND or DASH diet Colonoscopy or Cologuard Preventive Care 40-64 Years, Female Preventive care refers to lifestyle choices and visits with your health care provider that can promote health and wellness. What does preventive care include?  A yearly physical exam. This is also called an annual well check.  Dental exams once or twice a year.  Routine eye exams. Ask your health care provider how often you should have your eyes checked.  Personal lifestyle choices, including: ? Daily care of your teeth and gums. ? Regular physical activity. ? Eating a healthy diet. ? Avoiding tobacco and drug use. ? Limiting alcohol use. ? Practicing safe sex. ? Taking low-dose aspirin daily starting at age 49. ? Taking vitamin and mineral supplements as recommended by your health care provider. What happens during an annual well check? The services and screenings done by your health care provider during your annual well check will depend on your age, overall health, lifestyle risk factors, and family history of disease. Counseling Your health care provider may ask you questions about your:  Alcohol use.  Tobacco use.  Drug use.  Emotional well-being.  Home and relationship well-being.  Sexual activity.  Eating habits.  Work and work Statistician.  Method of birth control.  Menstrual cycle.  Pregnancy history.  Screening You may have the following tests or measurements:  Height, weight, and BMI.  Blood pressure.  Lipid and cholesterol levels. These may be checked every 5 years, or more frequently if you are over 24 years old.  Skin check.  Lung cancer screening. You may have this screening every year starting at age 72 if you have a 30-pack-year history of smoking and currently smoke or have quit within the past 15 years.  Fecal occult blood test (FOBT) of the stool. You  may have this test every year starting at age 77.  Flexible sigmoidoscopy or colonoscopy. You may have a sigmoidoscopy every 5 years or a colonoscopy every 10 years starting at age 74.  Hepatitis C blood test.  Hepatitis B blood test.  Sexually transmitted disease (STD) testing.  Diabetes screening. This is done by checking your blood sugar (glucose) after you have not eaten for a while (fasting). You may have this done every 1-3 years.  Mammogram. This may be done every 1-2 years. Talk to your health care provider about when you should start having regular mammograms. This may depend on whether you have a family history of breast cancer.  BRCA-related cancer screening. This may be done if you have a family history of breast, ovarian, tubal, or peritoneal cancers.  Pelvic exam and Pap test. This may be done every 3 years starting at age 53. Starting at age 43, this may be done every 5 years if you have a Pap test in combination with an HPV test.  Bone density scan. This is done to screen for osteoporosis. You may have this scan if you are at high risk for osteoporosis.  Discuss your test results, treatment options, and if necessary, the need for more tests with your health care provider. Vaccines Your health care provider may recommend certain vaccines, such as:  Influenza vaccine. This is recommended every year.  Tetanus, diphtheria, and acellular pertussis (Tdap, Td) vaccine. You may need a Td booster every 10 years.  Varicella vaccine. You may need this if you have not been vaccinated.  Zoster vaccine. You  may need this after age 53.  Measles, mumps, and rubella (MMR) vaccine. You may need at least one dose of MMR if you were born in 1957 or later. You may also need a second dose.  Pneumococcal 13-valent conjugate (PCV13) vaccine. You may need this if you have certain conditions and were not previously vaccinated.  Pneumococcal polysaccharide (PPSV23) vaccine. You may need one  or two doses if you smoke cigarettes or if you have certain conditions.  Meningococcal vaccine. You may need this if you have certain conditions.  Hepatitis A vaccine. You may need this if you have certain conditions or if you travel or work in places where you may be exposed to hepatitis A.  Hepatitis B vaccine. You may need this if you have certain conditions or if you travel or work in places where you may be exposed to hepatitis B.  Haemophilus influenzae type b (Hib) vaccine. You may need this if you have certain conditions.  Talk to your health care provider about which screenings and vaccines you need and how often you need them. This information is not intended to replace advice given to you by your health care provider. Make sure you discuss any questions you have with your health care provider. Document Released: 03/30/2015 Document Revised: 11/21/2015 Document Reviewed: 01/02/2015 Elsevier Interactive Patient Education  2017 [employer].

## 2016-08-14 NOTE — Assessment & Plan Note (Signed)
Encouraged DASH diet, decrease po intake and increase exercise as tolerated. Needs 7-8 hours of sleep nightly. Avoid trans fats, eat small, frequent meals every 4-5 hours with lean proteins, complex carbs and healthy fats. Minimize simple carbs, GMO foods. 

## 2016-08-14 NOTE — Assessment & Plan Note (Signed)
Well controlled, no changes to meds. Encouraged heart healthy diet such as the DASH diet and exercise as tolerated.  °

## 2016-08-14 NOTE — Assessment & Plan Note (Signed)
minimize simple carbs. Increase exercise as tolerated.  

## 2016-08-14 NOTE — Progress Notes (Signed)
Patient ID: Marissa Guzman, female   DOB: July 07, 1965, 51 y.o.   MRN: [id number]   Subjective:  I acted as a Neurosurgeon for Danise Edge, MD. [place], [place]   Patient ID: Marissa Guzman, female    DOB: 09-23-1965, 51 y.o.   MRN: [id number]  Chief Complaint  Patient presents with  . Annual Exam  . Hypertension  . Gastroesophageal Reflux  . Hyperlipidemia  . Hyperglycemia    Hypertension  This is a chronic problem. The problem is controlled. Associated symptoms include malaise/fatigue. Pertinent negatives include no blurred vision, chest pain, headaches, palpitations or shortness of breath.  Gastroesophageal Reflux  She reports no chest pain or no coughing. This is a chronic problem. The problem occurs occasionally. The problem has been unchanged.  Hyperlipidemia  This is a chronic problem. The problem is controlled. Pertinent negatives include no chest pain or shortness of breath. Risk factors for coronary artery disease include hypertension (Hyperglycemia.).  Hyperglycemia  This is a chronic problem. Pertinent negatives include no chest pain, congestion, coughing, fever, headaches, rash or vomiting.    Patient is in today for an annual examination. Patient has a Hx of hyperglycemia, hyperlipidemia, HTN, GERD. Patient has no acute concerns noted at this time. No recent febrile illness or hospitalizations. She has restarted with a trainer but is frustrated with weight gain and fatigue. Denies polyuria or polydipsia. Well controlled, no changes to meds. Encouraged heart healthy diet such as the DASH diet and exercise as tolerated.   Patient Care Team: Bradd Canary, MD as PCP - General (Family Medicine) Blondell Reveal, MD as Consulting Physician (Obstetrics and Gynecology)   Past Medical History:  Diagnosis Date  . Acute bronchitis 05/16/2012  . Allergic state 05/16/2012  . Constipation 04/16/2014  . GERD (gastroesophageal reflux disease) 06/12/2013  . Hip pain, bilateral 08/19/2015  .  HTN (hypertension)   . Hyperglycemia 08/10/2015  . Hyperlipidemia   . Hypokalemia 11/14/2012  . Overweight 08/14/2016  . Seasonal allergies     Past Surgical History:  Procedure Laterality Date  . CESAREAN SECTION    . HEMORRHOID SURGERY     two    Family History  Problem Relation Age of Onset  . Heart disease Father        Deceased  . Hypertension Father   . Lung cancer Father   . Melanoma Father        Eye Removal  . Hyperlipidemia Mother   . Hypertension Mother   . Hyperlipidemia Maternal Grandmother   . Diabetes Maternal Grandmother   . Dementia Maternal Grandmother   . Hypertension Sister        x2  . Hyperlipidemia Sister        x2    Social History   Social History  . Marital status: Married    Spouse name: N/A  . Number of children: N/A  . Years of education: N/A   Occupational History  . Not on file.   Social History Main Topics  . Smoking status: Never Smoker  . Smokeless tobacco: Never Used  . Alcohol use No  . Drug use: No  . 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 Not on file   Social History Narrative  . No narrative on file    Outpatient Medications Prior to Visit  Medication Sig Dispense Refill  . atorvastatin (LIPITOR)  80 MG tablet TAKE 1 TABLET BY MOUTH DAILY 90 tablet 2  . cetirizine (ZYRTEC) 10 MG tablet Take 10 mg by mouth daily as needed.    . fluticasone (FLONASE) 50 MCG/ACT nasal spray Place 2 sprays into the nose daily as needed.    Marland Kitchen lisinopril (PRINIVIL,ZESTRIL) 20 MG tablet TAKE 1 TABLET BY MOUTH DAILY 90 tablet 2  . metoprolol succinate (TOPROL-XL) 50 MG 24 hr tablet TAKE 1 TABLET BY MOUTH DAILY WITH OR IMMEDIATELY FOLLOWING A MEAL 90 tablet 2  . norgestrel-ethinyl estradiol (LO/OVRAL,CRYSELLE) 0.3-30 MG-MCG tablet Take 1 tablet by mouth daily. 1 Package 0  . omeprazole (PRILOSEC) 20 MG capsule Take 20  mg by mouth daily.    . potassium chloride SA (K-DUR,KLOR-CON) 20 MEQ tablet Take 1 tablet (20 mEq total) by mouth daily. 30 tablet 3  . psyllium (METAMUCIL) 58.6 % powder Take 1 packet by mouth daily.    . ranitidine (ZANTAC) 300 MG tablet Take 1 tablet (300 mg total) by mouth at bedtime. 30 tablet 5  . triamterene-hydrochlorothiazide (MAXZIDE-25) 37.5-25 MG tablet TAKE 1 TABLET BY MOUTH DAILY. 30 tablet 3   No facility-administered medications prior to visit.     Allergies  Allergen Reactions  . Niacin And Related     Extreme flushing despite taking it correctly  . Tetracyclines & Related Other (See Comments)    GI Issues.    Review of Systems  Constitutional: Positive for malaise/fatigue. Negative for fever.  HENT: Negative for congestion.   Eyes: Negative for blurred vision.  Respiratory: Negative for cough and shortness of breath.   Cardiovascular: Negative for chest pain, palpitations and leg swelling.  Gastrointestinal: Negative for vomiting.  Musculoskeletal: Negative for back pain.  Skin: Negative for rash.  Neurological: Negative for loss of consciousness and headaches.       Objective:    Physical Exam  Constitutional: She is oriented to person, place, and time. She appears well-developed and well-nourished. No distress.  HENT:  Head: Normocephalic and atraumatic.  Eyes: Conjunctivae are normal.  Neck: Normal range of motion. No thyromegaly present.  Cardiovascular: Normal rate and regular rhythm.   Pulmonary/Chest: Effort normal and breath sounds normal. She has no wheezes.  Abdominal: Soft. Bowel sounds are normal. There is no tenderness.  Musculoskeletal: She exhibits no edema or deformity.  Neurological: She is alert and oriented to person, place, and time.  Skin: Skin is warm and dry. She is not diaphoretic.  Psychiatric: She has a normal mood and affect.    BP 128/73 (BP Location: Left Arm, Patient Position: Sitting, Cuff Size: Normal)   Pulse 95    Temp 98.2 F (36.8 C) (Oral)   Ht 5\' 4"  (1.626 m)   Wt 163 lb 6.4 oz (74.1 kg)   SpO2 99% Comment: RA  BMI 28.05 kg/m  Wt Readings from Last 3 Encounters:  08/14/16 163 lb 6.4 oz (74.1 kg)  08/10/15 155 lb 6 oz (70.5 kg)  06/12/15 159 lb 6 oz (72.3 kg)   BP Readings from Last 3 Encounters:  08/14/16 128/73  08/10/15 108/72  07/03/15 131/86     Immunization History  Administered Date(s) Administered  . Tdap 02/01/2013    Health Maintenance  Topic Date Due  . HIV Screening  06/05/1980  . COLONOSCOPY  06/06/2015  . INFLUENZA VACCINE  10/15/2016  . MAMMOGRAM  03/18/2017  . PAP SMEAR  04/18/2019  . TETANUS/TDAP  02/02/2023    Lab Results  Component Value Date   WBC  8.3 08/10/2015   HGB 13.7 08/10/2015   HCT 41.0 08/10/2015   PLT 316.0 08/10/2015   GLUCOSE 73 08/10/2015   CHOL 265 (H) 08/10/2015   TRIG 263.0 (H) 08/10/2015   HDL 34.80 (L) 08/10/2015   LDLDIRECT 184.0 08/10/2015   LDLCALC 120 (H) 11/03/2014   ALT 22 08/10/2015   AST 24 08/10/2015   NA 137 08/10/2015   K 4.6 08/10/2015   CL 104 08/10/2015   CREATININE 0.99 08/10/2015   BUN 20 08/10/2015   CO2 24 08/10/2015   TSH 1.66 08/10/2015   HGBA1C 6.2 08/10/2015    Lab Results  Component Value Date   TSH 1.66 08/10/2015   Lab Results  Component Value Date   WBC 8.3 08/10/2015   HGB 13.7 08/10/2015   HCT 41.0 08/10/2015   MCV 88.0 08/10/2015   PLT 316.0 08/10/2015   Lab Results  Component Value Date   NA 137 08/10/2015   K 4.6 08/10/2015   CO2 24 08/10/2015   GLUCOSE 73 08/10/2015   BUN 20 08/10/2015   CREATININE 0.99 08/10/2015   BILITOT 0.9 08/10/2015   ALKPHOS 51 08/10/2015   AST 24 08/10/2015   ALT 22 08/10/2015   PROT 7.3 08/10/2015   ALBUMIN 4.2 08/10/2015   CALCIUM 10.0 08/10/2015   GFR 63.06 08/10/2015   Lab Results  Component Value Date   CHOL 265 (H) 08/10/2015   Lab Results  Component Value Date   HDL 34.80 (L) 08/10/2015   Lab Results  Component Value Date    LDLCALC 120 (H) 11/03/2014   Lab Results  Component Value Date   TRIG 263.0 (H) 08/10/2015   Lab Results  Component Value Date   CHOLHDL 8 08/10/2015   Lab Results  Component Value Date   HGBA1C 6.2 08/10/2015         Assessment & Plan:   Problem List Items Addressed This Visit    Essential hypertension    Well controlled, no changes to meds. Encouraged heart healthy diet such as the DASH diet and exercise as tolerated.       Hyperlipidemia    Encouraged heart healthy diet, increase exercise, avoid trans fats, consider a krill oil cap daily      Relevant Orders   Lipid panel   Preventative health care - Primary    Patient encouraged to maintain heart healthy diet, regular exercise, adequate sleep. Consider daily probiotics. Take medications as prescribed. Given and reviewed copy of ACP documents from [facility] Secretary of State and encouraged to complete and return      Hyperglycemia    minimize simple carbs. Increase exercise as tolerated.       Overweight    Encouraged DASH diet, decrease po intake and increase exercise as tolerated. Needs 7-8 hours of sleep nightly. Avoid trans fats, eat small, frequent meals every 4-5 hours with lean proteins, complex carbs and healthy fats. Minimize simple carbs, GMO foods       Other Visit Diagnoses    Hypertension, unspecified type       Relevant Orders   CBC   Comprehensive metabolic panel   TSH   Gastroesophageal reflux disease, esophagitis presence not specified          I am having Ms. Murdoch maintain her fluticasone, psyllium, cetirizine, norgestrel-ethinyl estradiol, omeprazole, ranitidine, potassium chloride SA, lisinopril, atorvastatin, metoprolol succinate, and triamterene-hydrochlorothiazide.  No orders of the defined types were placed in this encounter.   CMA served as Neurosurgeon during this visit. History,  Physical and Plan performed by medical provider. Documentation and orders reviewed and attested to.  Danise Edge, MD

## 2016-08-14 NOTE — Progress Notes (Signed)
Pre visit review using our clinic review tool, if applicable. No additional management support is needed unless otherwise documented below in the visit note. 

## 2016-08-14 NOTE — Assessment & Plan Note (Addendum)
Patient encouraged to maintain heart healthy diet, regular exercise, adequate sleep. Consider daily probiotics. Take medications as prescribed. Given and reviewed copy of ACP documents from [place] Secretary of State and encouraged to complete and return 

## 2016-08-15 ENCOUNTER — Other Ambulatory Visit: Payer: Self-pay | Admitting: Family Medicine

## 2016-08-15 DIAGNOSIS — E785 Hyperlipidemia, unspecified: Secondary | ICD-10-CM

## 2016-08-15 DIAGNOSIS — I10 Essential (primary) hypertension: Secondary | ICD-10-CM

## 2016-08-15 DIAGNOSIS — R739 Hyperglycemia, unspecified: Secondary | ICD-10-CM

## 2016-08-15 MED ORDER — PITAVASTATIN CALCIUM 4 MG PO TABS: 1.0000 | ORAL_TABLET | Freq: Every day | ORAL | 0 refills | Status: DC

## 2016-08-25 MED FILL — TRIAMTERENE-HCTZ 37.5-25 MG: 37.5-25 | 30 days supply | Qty: 30 | Fill #1

## 2016-09-22 MED FILL — ELINEST-28 TABLET: 0.3-30 | 63 days supply | Qty: 84 | Fill #2

## 2016-09-22 MED FILL — TRIAMTERENE-HCTZ 37.5-25 MG: 37.5-25 | 30 days supply | Qty: 30 | Fill #2

## 2016-09-22 MED FILL — LISINOPRIL 20 MG TABLET: 20 | 90 days supply | Qty: 90 | Fill #2

## 2016-10-30 MED FILL — ATORVASTATIN 80 MG TABLET: 80 | 90 days supply | Qty: 90 | Fill #2

## 2016-10-30 MED FILL — METOPROLOL SUCC ER 50 MG TA: 50 | 90 days supply | Qty: 90 | Fill #2

## 2016-10-30 MED FILL — TRIAMTERENE-HCTZ 37.5-25 MG: 37.5-25 | 30 days supply | Qty: 30 | Fill #3

## 2016-11-10 ENCOUNTER — Encounter: Payer: Self-pay | Admitting: Medical

## 2016-11-10 ENCOUNTER — Telehealth: Payer: Self-pay | Admitting: Family Medicine

## 2016-11-10 ENCOUNTER — Ambulatory Visit (INDEPENDENT_AMBULATORY_CARE_PROVIDER_SITE_OTHER): Payer: BLUE CROSS/BLUE SHIELD | Admitting: Medical

## 2016-11-10 VITALS — BP 141/86 | HR 101 | Temp 98.4°F | Resp 16 | Ht 64.0 in | Wt 160.0 lb

## 2016-11-10 DIAGNOSIS — R0789 Other chest pain: Secondary | ICD-10-CM | POA: Diagnosis not present

## 2016-11-10 DIAGNOSIS — M546 Pain in thoracic spine: Secondary | ICD-10-CM

## 2016-11-10 DIAGNOSIS — E785 Hyperlipidemia, unspecified: Secondary | ICD-10-CM

## 2016-11-10 LAB — LIPID PANEL
CHOL/HDL RATIO: 9
Cholesterol: 306 mg/dL — ABNORMAL HIGH (ref 0–200)
HDL: 34 mg/dL — ABNORMAL LOW (ref >39.00)
NONHDL: 272.04
TRIGLYCERIDES: 360 mg/dL — AB (ref 0.0–149.0)
VLDL: 72 mg/dL — AB (ref 0.0–40.0)

## 2016-11-10 LAB — TROPONIN I: TNIDX: 0.01 ug/l (ref 0.00–0.06)

## 2016-11-10 LAB — LDL CHOLESTEROL, DIRECT: Direct LDL: 204 mg/dL

## 2016-11-10 NOTE — Progress Notes (Signed)
Subjective:    Patient ID: Marissa Guzman, female    DOB: 04-Dec-1965, 51 y.o.   MRN: [id number]  HPI   Pt in for follow up.   Pt states 2 months ago she both chest and back pain(occured while sleeping). At that time back pain was worse but also some mid chest pain. Pain was level of lower scapula and between scapula. Pain lasted couple of minutes and she waited. Pain went away quickly within 2 minutes. Pt had arm pain briefly for a couple of minutes.  Pt can't remember if she was short of breath.   Then pain re-occured Saturday. Pain last couple of minutes. Pt states just sitting and came on quickly as before and for about same duration. No shortness of breath. No jaw pain. Subsided quickly on Saturday with no reoccurrence.  Pt cholesterol has always been high in the past. Pt blood pressure mild high today. Pt on atorvastatin and bp medications.   Pt is not diabetic.  Pt dad had coronary artery disease. He had MI in his 51's.   Pain level was 8/10 at peak but then subside and completely resolved in 2 minutes with each event.    Review of Systems  Constitutional: Negative for chills, fatigue and fever.  HENT: Negative for congestion and ear pain.   Respiratory: Negative for cough, choking, chest tightness, shortness of breath and wheezing.   Cardiovascular: Negative for chest pain and palpitations.       No chest pain presently. But some on Saturday.  Gastrointestinal: Negative for abdominal distention, abdominal pain, anal bleeding, nausea and rectal pain.  Genitourinary: Negative for dyspareunia and dysuria.  Musculoskeletal: Negative for back pain.  Skin: Negative for pallor and rash.  Neurological: Negative for dizziness, speech difficulty, weakness, light-headedness and numbness.  Hematological: Negative for adenopathy. Does not bruise/bleed easily.  Psychiatric/Behavioral: Negative for behavioral problems, confusion, dysphoric mood and sleep disturbance. The patient is not  nervous/anxious.    Past Medical History:  Diagnosis Date  . Acute bronchitis 05/16/2012  . Allergic state 05/16/2012  . Constipation 04/16/2014  . GERD (gastroesophageal reflux disease) 06/12/2013  . Hip pain, bilateral 08/19/2015  . HTN (hypertension)   . Hyperglycemia 08/10/2015  . Hyperlipidemia   . Hypokalemia 11/14/2012  . Overweight 08/14/2016  . Seasonal allergies      Social History   Social History  . Marital status: Married    Spouse name: N/A  . Number of children: N/A  . Years of education: N/A   Occupational History  . Not on file.   Social History Main Topics  . Smoking status: Never Smoker  . Smokeless tobacco: Never Used  . Alcohol use No  . Drug use: No  . 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 Not on file   Social History Narrative  . No narrative on file    Past Surgical History:  Procedure Laterality Date  . CESAREAN SECTION    . HEMORRHOID SURGERY     two    Family History  Problem Relation Age of Onset  . Heart disease Father        Deceased  . Hypertension Father   . Lung cancer Father   . Melanoma Father        Eye Removal  . Hyperlipidemia Mother   . Hypertension Mother   . Hyperlipidemia Maternal  Grandmother   . Diabetes Maternal Grandmother   . Dementia Maternal Grandmother   . Hypertension Sister        x2  . Hyperlipidemia Sister        x2    Allergies  Allergen Reactions  . Niacin And Related     Extreme flushing despite taking it correctly  . Tetracyclines & Related Other (See Comments)    GI Issues.    Current Outpatient Prescriptions on File Prior to Visit  Medication Sig Dispense Refill  . cetirizine (ZYRTEC) 10 MG tablet Take 10 mg by mouth daily as needed.    . fluticasone (FLONASE) 50 MCG/ACT nasal spray Place 2 sprays into the nose daily as needed.    Marland Kitchen lisinopril (PRINIVIL,ZESTRIL) 20 MG  tablet TAKE 1 TABLET BY MOUTH DAILY 90 tablet 2  . metoprolol succinate (TOPROL-XL) 50 MG 24 hr tablet TAKE 1 TABLET BY MOUTH DAILY WITH OR IMMEDIATELY FOLLOWING A MEAL 90 tablet 2  . norgestrel-ethinyl estradiol (LO/OVRAL,CRYSELLE) 0.3-30 MG-MCG tablet Take 1 tablet by mouth daily. 1 Package 0  . omeprazole (PRILOSEC) 20 MG capsule Take 20 mg by mouth daily.    . Pitavastatin Calcium (LIVALO) 4 MG TABS Take 1 tablet (4 mg total) by mouth daily. 90 tablet 0  . potassium chloride SA (K-DUR,KLOR-CON) 20 MEQ tablet Take 1 tablet (20 mEq total) by mouth daily. 30 tablet 3  . psyllium (METAMUCIL) 58.6 % powder Take 1 packet by mouth daily.    . ranitidine (ZANTAC) 300 MG tablet Take 1 tablet (300 mg total) by mouth at bedtime. 30 tablet 5  . triamterene-hydrochlorothiazide (MAXZIDE-25) 37.5-25 MG tablet TAKE 1 TABLET BY MOUTH DAILY. 30 tablet 3   No current facility-administered medications on file prior to visit.     BP (!) 141/86   Pulse (!) 101   Temp 98.4 F (36.9 C) (Oral)   Resp 16   Ht 5\' 4"  (1.626 m)   Wt 160 lb (72.6 kg)   SpO2 100%   BMI 27.46 kg/m         Objective:   Physical Exam   General Mental Status- Alert. General Appearance- Not in acute distress.   Skin General: Color- Normal Color. Moisture- Normal Moisture.  Neck Carotid Arteries- Normal color. Moisture- Normal Moisture. No carotid bruits. No JVD.  Chest and Lung Exam Auscultation: Breath Sounds:-Normal.  Cardiovascular Auscultation:Rythm- Regular. Murmurs & Other Heart Sounds:Auscultation of the heart reveals- No Murmurs.  Abdomen Inspection:-Inspeection Normal. Palpation/Percussion:Note:No mass. Palpation and Percussion of the abdomen reveal- Non Tender, Non Distended + BS, no rebound or guarding.    Neurologic Cranial Nerve exam:- CN III-XII intact(No nystagmus), symmetric smile. Strength:- 5/5 equal and symmetric strength both upper and lower extremities.   anterior thorax- on palpation  of the costochondral junction patient has no pain.  Posterior thorax-  On palpation of the scapulas and areas in between scapula no tenderness to palpation.     Assessment & Plan:  For your 2 episodes of severe upper back and chest pain,we got an EKG today( normal rhythm but mild tachycardia) and will get chest x-ray. Depending of results might consider getting further imaging studies if recommended by radiologist or if pain reoccurs.   Will send copy of this note to your PCP for review as well.  We will go and had and get lipid panel today fasting.  Also will get troponin.  Doubtful that it would be elevated but possibly down trending if cardiac event did occur on  Saturday.   We'll go ahead and refer you to cardiologist for evaluation  Due to risk factors  Hypertension, hyperlipidemia and family history of MI.    In the future if you have any recurrent events as you had described today and not resolving then would advise emergency department evaluation.  Follow-up here as needed before or after cardiologist appointment.  Vanshika Jastrzebski, Ramon Dredge, PA-C

## 2016-11-10 NOTE — Patient Instructions (Addendum)
For your 2 episodes of severe upper back and chest pain,we got an EKG today( normal rhythm but mild tachycardia) and will get chest x-ray. Depending of results might consider getting further imaging studies if recommended by radiologist or if pain reoccurs.   Will send copy of this note to your PCP for review as well.  We will go and had and get lipid panel today fasting.  Also will get troponin.  Doubtful that it would be elevated but possibly down trending if cardiac event did occur on Saturday.   We'll go ahead and refer you to cardiologist for evaluation  Due to risk factors  Hypertension, hyperlipidemia and family history of MI.    In the future if you have any recurrent events as you had described today and not resolving then would advise emergency department evaluation.  Follow up here as needed before or after cardiologist appointment.

## 2016-11-10 NOTE — Telephone Encounter (Signed)
Patient Name: Marissa Guzman  DOB: 1965-09-08    Initial Comment Caller states she has back pain, chest pain and numbness in both arms.   Nurse Assessment  Nurse: Scarlette Ar, RN, Heather Date/Time (Eastern Time): 11/10/2016 8:18:03 AM  Confirm and document reason for call. If symptomatic, describe symptoms. ---Caller states she has back pain, chest pain and numbness in both arms. This started about 2 months ago and has been off and on since. The last time it happened was Saturday  Does the patient have any new or worsening symptoms? ---Yes  Will a triage be completed? ---Yes  Related visit to physician within the last 2 weeks? ---No  Does the PT have any chronic conditions? (i.e. diabetes, asthma, etc.) ---Yes  List chronic conditions. ---See MR  Is the patient pregnant or possibly pregnant? (Ask all females between the ages of 47-55) ---No  Is this a behavioral health or substance abuse call? ---No     Guidelines    Guideline Title Affirmed Question Affirmed Notes  Chest Pain [1] Chest pain lasting <= 5 minutes AND [2] NO chest pain or cardiac symptoms now (Exceptions: pains lasting a few seconds)    Final Disposition User   See Physician within 24 Hours Standifer, RN, Research scientist (physical sciences)    Comments  Appt with Saguier at 915 this morning.   Referrals  REFERRED TO PCP OFFICE   Disagree/Comply: Comply

## 2016-11-11 ENCOUNTER — Ambulatory Visit (HOSPITAL_BASED_OUTPATIENT_CLINIC_OR_DEPARTMENT_OTHER)
Admission: RE | Admit: 2016-11-11 | Discharge: 2016-11-11 | Disposition: A | Discharge status: Home / Self Care | Payer: BLUE CROSS/BLUE SHIELD | Source: Ambulatory Visit | Attending: Medical | Admitting: Medical

## 2016-11-11 DIAGNOSIS — M546 Pain in thoracic spine: Secondary | ICD-10-CM | POA: Insufficient documentation

## 2016-11-11 DIAGNOSIS — R079 Chest pain, unspecified: Secondary | ICD-10-CM | POA: Diagnosis not present

## 2016-11-11 DIAGNOSIS — R0789 Other chest pain: Secondary | ICD-10-CM | POA: Insufficient documentation

## 2016-11-12 ENCOUNTER — Encounter: Payer: Self-pay | Admitting: Cardiology

## 2016-11-12 ENCOUNTER — Ambulatory Visit (INDEPENDENT_AMBULATORY_CARE_PROVIDER_SITE_OTHER): Payer: BLUE CROSS/BLUE SHIELD | Admitting: Cardiology

## 2016-11-12 VITALS — BP 120/80 | HR 109 | Ht 63.5 in | Wt 159.0 lb

## 2016-11-12 DIAGNOSIS — E782 Mixed hyperlipidemia: Secondary | ICD-10-CM

## 2016-11-12 DIAGNOSIS — R079 Chest pain, unspecified: Secondary | ICD-10-CM

## 2016-11-12 DIAGNOSIS — I10 Essential (primary) hypertension: Secondary | ICD-10-CM

## 2016-11-12 MED ORDER — EZETIMIBE 10 MG PO TABS: 10.0000 mg | ORAL_TABLET | Freq: Every day | ORAL | 1 refills | Status: DC

## 2016-11-12 MED ORDER — ICOSAPENT ETHYL 1 G PO CAPS: 2.0000 g | ORAL_CAPSULE | Freq: Two times a day (BID) | ORAL | 1 refills | Status: DC

## 2016-11-12 MED ORDER — NITROGLYCERIN 0.4 MG SL SUBL: 0.4000 mg | SUBLINGUAL_TABLET | SUBLINGUAL | 3 refills | Status: DC | PRN

## 2016-11-12 MED FILL — EZETIMIBE 10 MG TABLET: 10 | 90 days supply | Qty: 90 | Fill #0

## 2016-11-12 MED FILL — NITROGLYCERIN 0.4 MG TAB SL: 0.4 | 10 days supply | Qty: 25 | Fill #0

## 2016-11-12 NOTE — Progress Notes (Addendum)
Cardiology Office Note:     Addendum:  I had scheduled the patient for a stress test but this was denied by insurance. Therefore we scheduled the patient for a plain treadmill stress test. Patient has significant baseline EKG changes and therefore the stress test was canceled by the supervising physician and I'm in complete agreement with him.I think at this point this information needs to be provided to the insurance company for approval for a stress test with imaging mortality and my preference is for nuclear stress test. Time and date 11.19PM on 11/27/2016    Date:  11/12/2016   ID:  Marissa Guzman, DOB 01-19-1966, MRN [id number]  PCP:  Bradd Canary, MD  Cardiologist:  Garwin Brothers, MD   Referring MD: Bradd Canary, MD    ASSESSMENT:    1. Essential hypertension   2. Mixed hyperlipidemia   3. Chest pain, unspecified type    PLAN:    In order of problems listed above:  1. Primary prevention stressed to the patient. Importance of compliance with diet and medications stressed. She has multiple risk factors for coronary artery disease and therefore she will undergo exercise stress Cardiolite. 2. She also has bilateral carotid bruit and we will obtain a bilateral carotid Doppler. 3. The patient has very elevated LDL and triglycerides and asked her to start Zetia  10 mg daily. 2-3 weeks after she starts this medicine she will start Vascepa 2 g twice daily. She'll be back in 2 months for follow-up. A couple of days before the follow-up she'll come in for a fasting liver lipid check. She will be seen in follow-up appointment in 2 months or earlier if she has any concerns. 4. Sublingual nitroglycerin prescription was sent is protocol 911 protocol explained and she verbalized understanding. Further recommendations will be made based on the findings of the aforementioned tests.   Medication Adjustments/Labs and Tests Ordered: Current medicines are reviewed at length with the  patient today.  Concerns regarding medicines are outlined above.  No orders of the defined types were placed in this encounter.  No orders of the defined types were placed in this encounter.    History of Present Illness:    Marissa Guzman is a 51 y.o. female who is being seen today for the evaluation of Chest pain at the request of Bradd Canary, MD. Patient is a pleasant 51 year old female. She has past medical history of essential hypertension and dyslipidemia. She has markedly dyslipidemia for a long period of time including very elevated LDL and triglycerides. She comes to me to see me for episodes of chest pain. She tells me that she was resting in a substernal pressure-like symptoms radiating to the back. No orthopnea or PND. She is a an active lady. She does physical training twice a week with this she gets no symptoms. She does not do any aerobic kind of exercises. At the time of my evaluation she is alert awake oriented and in no distress. I asked him whether sexual activity brings about any of the symptoms and she answered in the negative.  Past Medical History:  Diagnosis Date  . Acute bronchitis 05/16/2012  . Allergic state 05/16/2012  . Constipation 04/16/2014  . GERD (gastroesophageal reflux disease) 06/12/2013  . Hip pain, bilateral 08/19/2015  . HTN (hypertension)   . Hyperglycemia 08/10/2015  . Hyperlipidemia   . Hypokalemia 11/14/2012  . Overweight 08/14/2016  . Seasonal allergies     Past Surgical History:  Procedure  Laterality Date  . CESAREAN SECTION    . HEMORRHOID SURGERY     two    Current Medications: Current Meds  Medication Sig  . atorvastatin (LIPITOR) 80 MG tablet Take 80 mg by mouth daily.   . cetirizine (ZYRTEC) 10 MG tablet Take 10 mg by mouth daily as needed.  . fluticasone (FLONASE) 50 MCG/ACT nasal spray Place 2 sprays into the nose daily as needed.  Marland Kitchen lisinopril (PRINIVIL,ZESTRIL) 20 MG tablet TAKE 1 TABLET BY MOUTH DAILY  . metoprolol succinate  (TOPROL-XL) 50 MG 24 hr tablet TAKE 1 TABLET BY MOUTH DAILY WITH OR IMMEDIATELY FOLLOWING A MEAL  . norgestrel-ethinyl estradiol (LO/OVRAL,CRYSELLE) 0.3-30 MG-MCG tablet Take 1 tablet by mouth daily.  Marland Kitchen omeprazole (PRILOSEC) 20 MG capsule Take 20 mg by mouth daily.  . Pitavastatin Calcium (LIVALO) 4 MG TABS Take 1 tablet (4 mg total) by mouth daily.  Marland Kitchen triamterene-hydrochlorothiazide (MAXZIDE-25) 37.5-25 MG tablet TAKE 1 TABLET BY MOUTH DAILY.     Allergies:   Niacin and related and Tetracyclines & related   Social History   Social History  . Marital status: Married    Spouse name: N/A  . Number of children: N/A  . Years of education: N/A   Social History Main Topics  . Smoking status: Never Smoker  . Smokeless tobacco: Never Used  . Alcohol use No  . Drug use: No  . 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 None   Social History Narrative  . None     Family History: The patient's family history includes Dementia in her maternal grandmother; Diabetes in her maternal grandmother; Heart disease in her father; Hyperlipidemia in her maternal grandmother, mother, and sister; Hypertension in her father, mother, and sister; Lung cancer in her father; Melanoma in her father.  ROS:   Please see the history of present illness.    All other systems reviewed and are negative.  EKGs/Labs/Other Studies Reviewed:    The following studies were reviewed today: I reviewed records from primary care physician's office including EKG and blood work. I discussed these reports with him extensively.   Recent Labs: 08/14/2016: ALT 28; BUN 16; Creatinine, Ser 0.91; Hemoglobin 13.6; Platelets 306.0; Potassium 3.5; Sodium 136; TSH 1.52  Recent Lipid Panel    Component Value Date/Time   CHOL 306 (H) 11/10/2016 1026   CHOL 185 11/29/2013 0911   TRIG 360.0 (H) 11/10/2016 1026    TRIG 160 (H) 11/29/2013 0911   HDL 34.00 (L) 11/10/2016 1026   HDL 37 (L) 11/29/2013 0911   CHOLHDL 9 11/10/2016 1026   VLDL 72.0 (H) 11/10/2016 1026   LDLCALC 120 (H) 11/03/2014 0829   LDLCALC 116 (H) 11/29/2013 0911   LDLDIRECT 204.0 11/10/2016 1026    Physical Exam:    VS:  BP 120/80   Pulse (!) 109   Ht 5' 3.5" (1.613 m)   Wt 159 lb (72.1 kg)   SpO2 98%   BMI 27.72 kg/m     Wt Readings from Last 3 Encounters:  11/12/16 159 lb (72.1 kg)  11/10/16 160 lb (72.6 kg)  08/14/16 163 lb 6.4 oz (74.1 kg)     GEN: Patient is in no acute distress HEENT: Normal NECK: No JVD; bilateral soft carotid bruits LYMPHATICS: No lymphadenopathy CARDIAC: S1 S2 regular, 2/6 systolic murmur at the apex. RESPIRATORY:  Clear to auscultation without  rales, wheezing or rhonchi  ABDOMEN: Soft, non-tender, non-distended MUSCULOSKELETAL:  No edema; No deformity  SKIN: Warm and dry NEUROLOGIC:  Alert and oriented x 3 PSYCHIATRIC:  Normal affect    Signed, Garwin Brothers, MD  11/12/2016 9:49 AM    [facility]

## 2016-11-12 NOTE — Patient Instructions (Addendum)
Medication Instructions:  Your physician has recommended you make the following change in your medication:   START: Zetia 10 mg once daily          Vascepa 2 gm twice daily.   Labwork:  Your physician recommends that you return for lab work 2 days before your 2 months follow up. Make sure you come fasting.  You do not need appointment for lab work, just walk in.   Testing/Procedures:  Your physician has requested that you have en exercise stress myoview. For further information please visit [email or web address] Please follow instruction sheet, as given.  Your physician has requested that you have a carotid duplex. This test is an ultrasound of the carotid arteries in your neck. It looks at blood flow through these arteries that supply the brain with blood. Allow one hour for this exam. There are no restrictions or special instructions.    Follow-Up: Your physician recommends that you schedule a follow-up appointment in: 2 months with Dr. Tomie China.    Any Other Special Instructions Will Be Listed Below (If Applicable).     If you need a refill on your cardiac medications before your next appointment, please call your pharmacy.

## 2016-11-13 ENCOUNTER — Telehealth: Payer: Self-pay

## 2016-11-13 NOTE — Telephone Encounter (Signed)
Pt is needing a PA for Vascepa that was ordered by Dr.Revankar. I called pt to inform her that we have some samples available and would leave a few at the front desk for her.

## 2016-11-19 ENCOUNTER — Other Ambulatory Visit (INDEPENDENT_AMBULATORY_CARE_PROVIDER_SITE_OTHER): Payer: BLUE CROSS/BLUE SHIELD

## 2016-11-19 DIAGNOSIS — R739 Hyperglycemia, unspecified: Secondary | ICD-10-CM | POA: Diagnosis not present

## 2016-11-19 DIAGNOSIS — I10 Essential (primary) hypertension: Secondary | ICD-10-CM | POA: Diagnosis not present

## 2016-11-19 DIAGNOSIS — E785 Hyperlipidemia, unspecified: Secondary | ICD-10-CM | POA: Diagnosis not present

## 2016-11-19 LAB — CBC
HEMATOCRIT: 40.4 % (ref 36.0–46.0)
HEMOGLOBIN: 13.5 g/dL (ref 12.0–15.0)
MCHC: 33.6 g/dL (ref 30.0–36.0)
MCV: 89.3 fl (ref 78.0–100.0)
Platelets: 278 10*3/uL (ref 150.0–400.0)
RBC: 4.52 Mil/uL (ref 3.87–5.11)
RDW: 13.5 % (ref 11.5–15.5)
WBC: 7.1 10*3/uL (ref 4.0–10.5)

## 2016-11-19 LAB — COMPREHENSIVE METABOLIC PANEL
ALBUMIN: 4 g/dL (ref 3.5–5.2)
ALK PHOS: 57 U/L (ref 39–117)
ALT: 16 U/L (ref 0–35)
AST: 22 U/L (ref 0–37)
BUN: 17 mg/dL (ref 6–23)
CALCIUM: 9.4 mg/dL (ref 8.4–10.5)
CO2: 25 mEq/L (ref 19–32)
Chloride: 100 mEq/L (ref 96–112)
Creatinine, Ser: 1 mg/dL (ref 0.40–1.20)
GFR: 62.01 mL/min (ref >60.00)
Glucose, Bld: 90 mg/dL (ref 70–99)
POTASSIUM: 3.4 meq/L — AB (ref 3.5–5.1)
Sodium: 137 mEq/L (ref 135–145)
Total Bilirubin: 1 mg/dL (ref 0.2–1.2)
Total Protein: 7 g/dL (ref 6.0–8.3)

## 2016-11-19 LAB — TSH: TSH: 1.78 u[IU]/mL (ref 0.35–4.50)

## 2016-11-19 LAB — LIPID PANEL
CHOLESTEROL: 234 mg/dL — AB (ref 0–200)
HDL: 35 mg/dL — ABNORMAL LOW (ref >39.00)
NonHDL: 198.7
TRIGLYCERIDES: 226 mg/dL — AB (ref 0.0–149.0)
Total CHOL/HDL Ratio: 7
VLDL: 45.2 mg/dL — ABNORMAL HIGH (ref 0.0–40.0)

## 2016-11-19 LAB — HEMOGLOBIN A1C: Hgb A1c MFr Bld: 6.3 % (ref 4.6–6.5)

## 2016-11-19 LAB — LDL CHOLESTEROL, DIRECT: LDL DIRECT: 157 mg/dL

## 2016-11-20 MED FILL — VASCEPA 1 GM CAPSULE: 1 | 45 days supply | Qty: 180 | Fill #0

## 2016-11-21 ENCOUNTER — Other Ambulatory Visit: Payer: Self-pay | Admitting: Cardiology

## 2016-11-21 ENCOUNTER — Telehealth: Payer: Self-pay

## 2016-11-21 ENCOUNTER — Other Ambulatory Visit: Payer: Self-pay

## 2016-11-21 DIAGNOSIS — R0789 Other chest pain: Secondary | ICD-10-CM

## 2016-11-21 DIAGNOSIS — R0989 Other specified symptoms and signs involving the circulatory and respiratory systems: Secondary | ICD-10-CM

## 2016-11-21 MED ORDER — ATORVASTATIN CALCIUM 80 MG PO TABS: 80.0000 mg | ORAL_TABLET | Freq: Every day | ORAL | 1 refills | Status: DC

## 2016-11-21 NOTE — Telephone Encounter (Signed)
-----   Message from Garwin Brothers, MD sent at 11/21/2016  2:36 PM EDT ----- Regarding: FW: Denied Let her start with generic fish oil 2 g twice daily and blood check in 2 months ----- Message ----- From: Domingo Madeira, CMA Sent: 11/20/2016   3:01 PM To: Garwin Brothers, MD Subject: Denied                                         Pt was denied Vascepa is there another medication you would like her to try???

## 2016-11-21 NOTE — Telephone Encounter (Signed)
S/w pt and she states she actually purchased the medication for $400.00. She states she will take the medication as prescribed and f/u after she completes the medication.

## 2016-11-25 ENCOUNTER — Telehealth (HOSPITAL_COMMUNITY): Payer: Self-pay | Admitting: *Deleted

## 2016-11-25 NOTE — Telephone Encounter (Signed)
Close encounter 

## 2016-11-26 ENCOUNTER — Other Ambulatory Visit: Payer: Self-pay

## 2016-11-26 DIAGNOSIS — R079 Chest pain, unspecified: Secondary | ICD-10-CM

## 2016-11-26 DIAGNOSIS — E782 Mixed hyperlipidemia: Secondary | ICD-10-CM

## 2016-11-27 ENCOUNTER — Ambulatory Visit (HOSPITAL_COMMUNITY)
Admission: RE | Admit: 2016-11-27 | Payer: BLUE CROSS/BLUE SHIELD | Source: Ambulatory Visit | Attending: Cardiology | Admitting: Cardiology

## 2016-11-27 ENCOUNTER — Ambulatory Visit (HOSPITAL_COMMUNITY)
Admission: RE | Admit: 2016-11-27 | Discharge: 2016-11-27 | Disposition: A | Discharge status: Home / Self Care | Payer: BLUE CROSS/BLUE SHIELD | Source: Ambulatory Visit | Attending: Cardiology | Admitting: Cardiology

## 2016-11-27 ENCOUNTER — Other Ambulatory Visit: Payer: Self-pay

## 2016-11-27 ENCOUNTER — Inpatient Hospital Stay (HOSPITAL_COMMUNITY): Admission: RE | Admit: 2016-11-27 | Payer: BLUE CROSS/BLUE SHIELD | Source: Ambulatory Visit

## 2016-11-27 DIAGNOSIS — I6523 Occlusion and stenosis of bilateral carotid arteries: Secondary | ICD-10-CM | POA: Diagnosis not present

## 2016-11-27 DIAGNOSIS — R9431 Abnormal electrocardiogram [ECG] [EKG]: Secondary | ICD-10-CM

## 2016-11-27 DIAGNOSIS — E782 Mixed hyperlipidemia: Secondary | ICD-10-CM

## 2016-11-27 DIAGNOSIS — R0989 Other specified symptoms and signs involving the circulatory and respiratory systems: Secondary | ICD-10-CM | POA: Diagnosis not present

## 2016-11-27 DIAGNOSIS — R079 Chest pain, unspecified: Secondary | ICD-10-CM

## 2016-11-28 MED FILL — ELINEST-28 TABLET: 0.3-30 | 63 days supply | Qty: 84 | Fill #3

## 2016-12-03 ENCOUNTER — Other Ambulatory Visit: Payer: Self-pay | Admitting: Family Medicine

## 2016-12-03 MED FILL — TRIAMTERENE-HCTZ 37.5-25 MG: 37.5-25 | 30 days supply | Qty: 30 | Fill #0

## 2016-12-10 ENCOUNTER — Telehealth (HOSPITAL_COMMUNITY): Payer: Self-pay

## 2016-12-10 NOTE — Telephone Encounter (Signed)
Encounter complete. 

## 2016-12-11 ENCOUNTER — Ambulatory Visit (HOSPITAL_COMMUNITY)
Admission: RE | Admit: 2016-12-11 | Discharge: 2016-12-11 | Disposition: A | Discharge status: Home / Self Care | Payer: BLUE CROSS/BLUE SHIELD | Source: Ambulatory Visit | Attending: Cardiology | Admitting: Cardiology

## 2016-12-11 DIAGNOSIS — R9431 Abnormal electrocardiogram [ECG] [EKG]: Secondary | ICD-10-CM | POA: Diagnosis not present

## 2016-12-11 IMAGING — NM NM MISC PROCEDURE
9 series · 54 of 54 positions shown · non-contrast
Comparison: none

[Series 1: rest sax · 6.4mm · 6.40mm/px · 6 of 21 frames shown]
[frame 2/21]
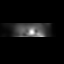
[frame 6/21]
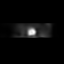
[frame 9/21]
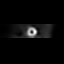
[frame 13/21]
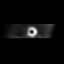
[frame 16/21]
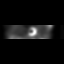
[frame 20/21]
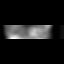

[Series 1: wbr rest · 6.40mm/px · 6 of 64 frames shown]
[frame 6/64]
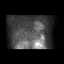
[frame 16/64]
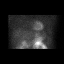
[frame 27/64]
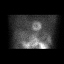
[frame 38/64]
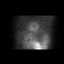
[frame 48/64]
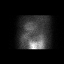
[frame 59/64]
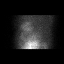

[Series 1: wbr_r-proj_st wbr rest · 6.40mm/px · 6 of 64 frames shown]
[frame 6/64]
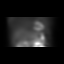
[frame 16/64]
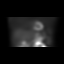
[frame 27/64]
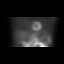
[frame 38/64]
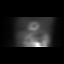
[frame 48/64]
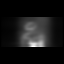
[frame 59/64]
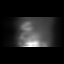

[Series 2: stress sax gs · 6.4mm · 6.40mm/px · 6 of 168 frames shown]
[frame 15/168]
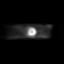
[frame 43/168]
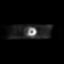
[frame 71/168]
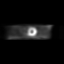
[frame 99/168]
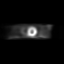
[frame 127/168]
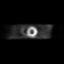
[frame 155/168]
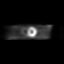

[Series 2: stress sax · 6.4mm · 6.40mm/px · 6 of 21 frames shown]
[frame 2/21]
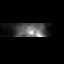
[frame 6/21]
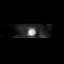
[frame 9/21]
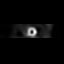
[frame 13/21]
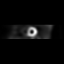
[frame 16/21]
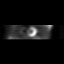
[frame 20/21]
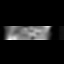

[Series 2: wbr_s-proj_st wbr stress-gsp · 6.40mm/px · 6 of 512 frames shown]
[frame 43/512]
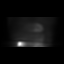
[frame 128/512]
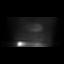
[frame 214/512]
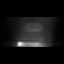
[frame 299/512]
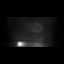
[frame 384/512]
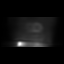
[frame 470/512]
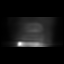

[Series 2: wbr stress-gsp · 6.40mm/px · 6 of 512 frames shown]
[frame 43/512]
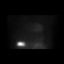
[frame 128/512]
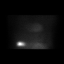
[frame 214/512]
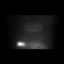
[frame 299/512]
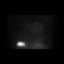
[frame 384/512]
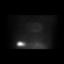
[frame 470/512]
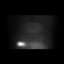

[Series 3: wbr stress-sum-em · 6.40mm/px · 6 of 64 frames shown]
[frame 6/64]
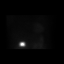
[frame 16/64]
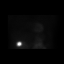
[frame 27/64]
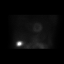
[frame 38/64]
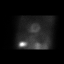
[frame 48/64]
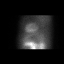
[frame 59/64]
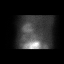

[Series 3: wbr_s-proj_st wbr stress-sum-em · 6.40mm/px · 6 of 64 frames shown]
[frame 6/64]
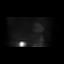
[frame 16/64]
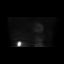
[frame 27/64]
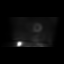
[frame 38/64]
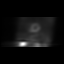
[frame 48/64]
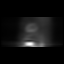
[frame 59/64]
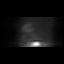

[54 of 54 positions shown; findings below may reference images not displayed]

Canned report from images found in remote index.

Refer to host system for actual result text.

## 2016-12-11 MED ORDER — REGADENOSON 0.4 MG/5ML IV SOLN
0.4000 mg | Freq: Once | INTRAVENOUS | Status: AC
  Administered 2016-12-11: 0.4 mg via INTRAVENOUS

## 2016-12-11 MED ORDER — TECHNETIUM TC 99M TETROFOSMIN IV KIT
31.2000 | PACK | Freq: Once | INTRAVENOUS | Status: AC | PRN
  Administered 2016-12-11: 31.2 via INTRAVENOUS
  Filled 2016-12-11: qty 32

## 2016-12-11 MED ORDER — TECHNETIUM TC 99M TETROFOSMIN IV KIT
10.1000 | PACK | Freq: Once | INTRAVENOUS | Status: AC | PRN
  Administered 2016-12-11: 10.1 via INTRAVENOUS
  Filled 2016-12-11: qty 11

## 2016-12-12 LAB — MYOCARDIAL PERFUSION IMAGING
CHL CUP RESTING HR STRESS: 99 {beats}/min
CSEPPHR: 160 {beats}/min
LV sys vol: 20 mL
LVDIAVOL: 65 mL (ref 46–106)
NUC STRESS TID: 1.07
SDS: 0
SRS: 2
SSS: 2

## 2017-01-08 ENCOUNTER — Other Ambulatory Visit: Payer: Self-pay

## 2017-01-08 ENCOUNTER — Other Ambulatory Visit: Payer: Self-pay | Admitting: Cardiology

## 2017-01-08 DIAGNOSIS — I10 Essential (primary) hypertension: Secondary | ICD-10-CM | POA: Diagnosis not present

## 2017-01-08 DIAGNOSIS — E782 Mixed hyperlipidemia: Secondary | ICD-10-CM | POA: Diagnosis not present

## 2017-01-08 DIAGNOSIS — R079 Chest pain, unspecified: Secondary | ICD-10-CM

## 2017-01-09 LAB — HEPATIC FUNCTION PANEL
ALBUMIN: 4 g/dL (ref 3.5–5.5)
ALT: 17 IU/L (ref 0–32)
AST: 22 IU/L (ref 0–40)
Alkaline Phosphatase: 60 IU/L (ref 39–117)
BILIRUBIN TOTAL: 0.6 mg/dL (ref 0.0–1.2)
Bilirubin, Direct: 0.14 mg/dL (ref 0.00–0.40)
Total Protein: 6.7 g/dL (ref 6.0–8.5)

## 2017-01-09 LAB — LIPID PANEL
CHOL/HDL RATIO: 5.4 ratio — AB (ref 0.0–4.4)
CHOL/HDL RATIO: 5.6 ratio — AB (ref 0.0–4.4)
CHOLESTEROL TOTAL: 195 mg/dL (ref 100–199)
Cholesterol, Total: 194 mg/dL (ref 100–199)
HDL: 35 mg/dL — AB (ref >39)
HDL: 36 mg/dL — AB (ref >39)
LDL Calculated: 122 mg/dL — ABNORMAL HIGH (ref 0–99)
LDL Calculated: 124 mg/dL — ABNORMAL HIGH (ref 0–99)
TRIGLYCERIDES: 178 mg/dL — AB (ref 0–149)
Triglycerides: 178 mg/dL — ABNORMAL HIGH (ref 0–149)
VLDL CHOLESTEROL CAL: 36 mg/dL (ref 5–40)
VLDL Cholesterol Cal: 36 mg/dL (ref 5–40)

## 2017-01-12 ENCOUNTER — Ambulatory Visit (INDEPENDENT_AMBULATORY_CARE_PROVIDER_SITE_OTHER): Payer: BLUE CROSS/BLUE SHIELD | Admitting: Cardiology

## 2017-01-12 ENCOUNTER — Encounter: Payer: Self-pay | Admitting: Cardiology

## 2017-01-12 VITALS — BP 120/84 | HR 125 | Ht 63.0 in | Wt 158.1 lb

## 2017-01-12 DIAGNOSIS — I10 Essential (primary) hypertension: Secondary | ICD-10-CM

## 2017-01-12 MED ORDER — METOPROLOL SUCCINATE ER 50 MG PO TB24: 100.0000 mg | ORAL_TABLET | Freq: Every day | ORAL | 1 refills | Status: DC

## 2017-01-12 MED FILL — VASCEPA 1 GM CAPSULE: 1 | 45 days supply | Qty: 180 | Fill #1

## 2017-01-12 MED FILL — TRIAMTERENE-HCTZ 37.5-25 MG: 37.5-25 | 30 days supply | Qty: 30 | Fill #1

## 2017-01-12 MED FILL — METOPROLOL SUCC ER 50 MG TA: 50 | 45 days supply | Qty: 90 | Fill #0

## 2017-01-12 NOTE — Addendum Note (Signed)
Addended by: Craige Cotta on: 01/12/2017 10:05 AM   Modules accepted: Orders

## 2017-01-12 NOTE — Progress Notes (Signed)
Cardiology Office Note:    Date:  01/12/2017   ID:  Marissa Guzman, DOB 1965/12/30, MRN [id number]  PCP:  Bradd Canary, MD  Cardiologist:  Garwin Brothers, MD   Referring MD: Bradd Canary, MD    ASSESSMENT:    No diagnosis found. PLAN:    In order of problems listed above:  1. Stress test and the results were explained to the patient. She is happy about. Importance of compliance with diet and medications stressed and she vocalized understanding. 2. Her exercise is limited because of elevated heart rate. I told her to stop her lisinopril. I increased her metoprolol 100 mg daily. She'll be keeping her track of her blood pressures daily and pulse rates. She'll be back in a month or earlier if she has any concerns. Importance of compliance with diet and medications stressed. Exercise routine was emphasized. 3. Patient will be seen in follow-up appointment in 6 months or earlier if the patient has any concerns.    Medication Adjustments/Labs and Tests Ordered: Current medicines are reviewed at length with the patient today.  Concerns regarding medicines are outlined above.  No orders of the defined types were placed in this encounter.  No orders of the defined types were placed in this encounter.    Chief Complaint  Patient presents with  . Follow-up    doing about the same.      History of Present Illness:    Marissa Guzman is a 51 y.o. female . The patient has history of essential hypertension and dyslipidemia.She denies any problems other than the fact that when she exercises she gets tired. No chest pain orthopnea or PND. At the time of my evaluation she is alert awake oriented and in no distress.  Past Medical History:  Diagnosis Date  . Acute bronchitis 05/16/2012  . Allergic state 05/16/2012  . Constipation 04/16/2014  . GERD (gastroesophageal reflux disease) 06/12/2013  . Hip pain, bilateral 08/19/2015  . HTN (hypertension)   . Hyperglycemia 08/10/2015  .  Hyperlipidemia   . Hypokalemia 11/14/2012  . Overweight 08/14/2016  . Seasonal allergies     Past Surgical History:  Procedure Laterality Date  . CESAREAN SECTION    . HEMORRHOID SURGERY     two    Current Medications: Current Meds  Medication Sig  . atorvastatin (LIPITOR) 80 MG tablet Take 1 tablet (80 mg total) by mouth daily.  . cetirizine (ZYRTEC) 10 MG tablet Take 10 mg by mouth daily as needed.  . ezetimibe (ZETIA) 10 MG tablet Take 1 tablet (10 mg total) by mouth daily.  . fluticasone (FLONASE) 50 MCG/ACT nasal spray Place 2 sprays into the nose daily as needed.  Bess Harvest Ethyl (VASCEPA) 1 g CAPS Take 2 g by mouth 2 (two) times daily.  Marland Kitchen lisinopril (PRINIVIL,ZESTRIL) 20 MG tablet TAKE 1 TABLET BY MOUTH DAILY  . metoprolol succinate (TOPROL-XL) 50 MG 24 hr tablet TAKE 1 TABLET BY MOUTH DAILY WITH OR IMMEDIATELY FOLLOWING A MEAL  . nitroGLYCERIN (NITROSTAT) 0.4 MG SL tablet Place 1 tablet (0.4 mg total) under the tongue every 5 (five) minutes as needed for chest pain.  . norgestrel-ethinyl estradiol (LO/OVRAL,CRYSELLE) 0.3-30 MG-MCG tablet Take 1 tablet by mouth daily.  Marland Kitchen omeprazole (PRILOSEC) 20 MG capsule Take 20 mg by mouth daily.  Marland Kitchen triamterene-hydrochlorothiazide (MAXZIDE-25) 37.5-25 MG tablet TAKE 1 TABLET BY MOUTH DAILY.     Allergies:   Niacin and related and Tetracyclines & related   Social History  Social History  . Marital status: Married    Spouse name: N/A  . Number of children: N/A  . Years of education: N/A   Social History Main Topics  . Smoking status: Never Smoker  . Smokeless tobacco: Never Used  . Alcohol use No  . Drug use: No  . 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 None   Social History Narrative  . None     Family History: The patient's family history includes Dementia in her maternal grandmother; Diabetes in  her maternal grandmother; Heart disease in her father; Hyperlipidemia in her maternal grandmother, mother, and sister; Hypertension in her father, mother, and sister; Lung cancer in her father; Melanoma in her father.  ROS:   Please see the history of present illness.    All other systems reviewed and are negative.  EKGs/Labs/Other Studies Reviewed:    The following studies were reviewed today: I discussed my findings with the patient at extensive length and she vocalized understanding.   Recent Labs: 11/19/2016: BUN 17; Creatinine, Ser 1.00; Hemoglobin 13.5; Platelets 278.0; Potassium 3.4; Sodium 137; TSH 1.78 01/08/2017: ALT 17  Recent Lipid Panel    Component Value Date/Time   CHOL 194 01/08/2017 0815   CHOL 185 11/29/2013 0911   TRIG 178 (H) 01/08/2017 0815   TRIG 160 (H) 11/29/2013 0911   HDL 36 (L) 01/08/2017 0815   HDL 37 (L) 11/29/2013 0911   CHOLHDL 5.4 (H) 01/08/2017 0815   CHOLHDL 7 11/19/2016 0828   VLDL 45.2 (H) 11/19/2016 0828   LDLCALC 122 (H) 01/08/2017 0815   LDLCALC 116 (H) 11/29/2013 0911   LDLDIRECT 157.0 11/19/2016 0828    Physical Exam:    VS:  BP 120/84   Pulse (!) 125   Ht 5\' 3"  (1.6 m)   Wt 158 lb 1.9 oz (71.7 kg)   SpO2 95%   BMI 28.01 kg/m     Wt Readings from Last 3 Encounters:  01/12/17 158 lb 1.9 oz (71.7 kg)  12/11/16 159 lb (72.1 kg)  11/12/16 159 lb (72.1 kg)     GEN: Patient is in no acute distress HEENT: Normal NECK: No JVD; No carotid bruits LYMPHATICS: No lymphadenopathy CARDIAC: Hear sounds regular, 2/6 systolic murmur at the apex. RESPIRATORY:  Clear to auscultation without rales, wheezing or rhonchi  ABDOMEN: Soft, non-tender, non-distended MUSCULOSKELETAL:  No edema; No deformity  SKIN: Warm and dry NEUROLOGIC:  Alert and oriented x 3 PSYCHIATRIC:  Normal affect   Signed, 11/14/16, MD  01/12/2017 9:48 AM    [facility]

## 2017-01-12 NOTE — Patient Instructions (Signed)
Medication Instructions:  Your physician has recommended you make the following change in your medication:  STOP lisinopril CHANGE Metoprolol from 50mg  daily to 100mg  daily  Labwork: None  Testing/Procedures: None  Follow-Up: Your physician recommends that you schedule a follow-up appointment in: 1 month   Any Other Special Instructions Will Be Listed Below (If Applicable).  Dr. would like pulse and BP checks 3 times daily at different times of the day; please log the dates and times; then bring this log to the office.   If you need a refill on your cardiac medications before your next appointment, please call your pharmacy.   [facility]  , RN, BSN Blood Pressure Record Sheet Your blood pressure on this visit to the emergency department or clinic is elevated. This does not necessarily mean you have high blood pressure (hypertension), but it does mean that your blood pressure needs to be rechecked. Many times your blood pressure can increase due to illness, pain, anxiety, or other factors. We recommend that you get a series of blood pressure readings done over a period of 5 days. It is best to get a reading in the morning and one in the evening. You should make sure to sit and relax for 1-5 minutes before the reading is taken. Write the readings down and make a follow-up appointment with your health care provider to discuss the results. If there is not a free clinic or a drug store with a blood-pressure-taking machine near you, you can purchase blood-pressure-taking equipment from a drug store. Having one in the home allows you the convenience of taking your blood pressure while you are home and relaxed. Blood Pressure Log Date: _______________________  a.m. _____________________  p.m. _____________________  Date: _______________________  a.m. _____________________  p.m. _____________________  Date: _______________________  a.m.  _____________________  p.m. _____________________  Date: _______________________  a.m. _____________________  p.m. _____________________  Date: _______________________  a.m. _____________________  p.m. _____________________ Date: _______________________  a.m. _____________________  p.m. _____________________  Date: _______________________  a.m. _____________________  p.m. _____________________  Date: _______________________  a.m. _____________________  p.m. _____________________  Date: _______________________  a.m. _____________________  p.m. _____________________  Date: _______________________  a.m. _____________________ p.m. _____________________  Date: _______________________  a.m. _____________________  p.m. _____________________  Date: _______________________  a.m. _____________________  p.m. _____________________  Date: _______________________  a.m. _____________________  p.m. _____________________  Date: _______________________  a.m. _____________________  p.m. _____________________  Date: _______________________  a.m. _____________________  p.m. _____________________ This information is not intended to replace advice given to you by your health care provider. Make sure you discuss any questions you have with your health care provider. Document Released: 11/30/2002 Document Revised: 02/15/2016 Document Reviewed: 04/26/2013 Elsevier Interactive Patient Education  2018 14/03/2015.

## 2017-01-12 NOTE — Addendum Note (Signed)
Addended by: Domingo Madeira on: 01/12/2017 09:55 AM   Modules accepted: Orders

## 2017-01-20 MED FILL — ELINEST-28 TABLET: 0.3-30 | 63 days supply | Qty: 84 | Fill #0

## 2017-02-09 ENCOUNTER — Other Ambulatory Visit: Payer: Self-pay | Admitting: Family Medicine

## 2017-02-09 MED FILL — ATORVASTATIN 80 MG TABLET: 80 | 90 days supply | Qty: 90 | Fill #0

## 2017-02-09 MED FILL — EZETIMIBE 10 MG TABS: 10 | 90 days supply | Qty: 90 | Fill #1

## 2017-02-10 MED FILL — TRIAMTERENE-HCTZ 37.5-25 MG: 37.5-25 | 30 days supply | Qty: 30 | Fill #0

## 2017-02-11 ENCOUNTER — Ambulatory Visit (INDEPENDENT_AMBULATORY_CARE_PROVIDER_SITE_OTHER): Payer: BLUE CROSS/BLUE SHIELD | Admitting: Cardiology

## 2017-02-11 ENCOUNTER — Encounter: Payer: Self-pay | Admitting: Cardiology

## 2017-02-11 VITALS — BP 138/80 | HR 94 | Ht 63.0 in | Wt 159.1 lb

## 2017-02-11 DIAGNOSIS — E7800 Pure hypercholesterolemia, unspecified: Secondary | ICD-10-CM | POA: Diagnosis not present

## 2017-02-11 DIAGNOSIS — E782 Mixed hyperlipidemia: Secondary | ICD-10-CM

## 2017-02-11 DIAGNOSIS — R079 Chest pain, unspecified: Secondary | ICD-10-CM

## 2017-02-11 DIAGNOSIS — I10 Essential (primary) hypertension: Secondary | ICD-10-CM

## 2017-02-11 MED ORDER — METOPROLOL SUCCINATE ER 50 MG PO TB24: 100.0000 mg | ORAL_TABLET | Freq: Every day | ORAL | 2 refills | Status: DC

## 2017-02-11 MED ORDER — EZETIMIBE 10 MG PO TABS: 10.0000 mg | ORAL_TABLET | Freq: Every day | ORAL | 2 refills | Status: DC

## 2017-02-11 NOTE — Progress Notes (Signed)
Cardiology Office Note:    Date:  02/11/2017   ID:  Marissa Guzman, DOB March 18, 1965, MRN [id number]  PCP:  Bradd Canary, MD  Cardiologist:  Garwin Brothers, MD   Referring MD: Bradd Canary, MD    ASSESSMENT:    1. Hypertension, benign essential, goal below 140/90   2. Hypercholesterolemia    PLAN:    In order of problems listed above:  1. Prevention stressed with the patient.  Importance of compliance with diet and medication stressed and she vocalized understanding. 2. I discussed the findings of the carotid Doppler and stress test with extensive length and she vocalized understanding. 3. I told her to be very vigorous with her exercise and diet especially in view of the fact that she has a significant genetic component to her dyslipidemia.  She verbalized understanding.  She will be seen in follow-up appointment in 6 months or earlier if she has any concerns.   Medication Adjustments/Labs and Tests Ordered: Current medicines are reviewed at length with the patient today.  Concerns regarding medicines are outlined above.  No orders of the defined types were placed in this encounter.  No orders of the defined types were placed in this encounter.    Chief Complaint  Patient presents with  . Follow-up    BP med changes     History of Present Illness:    Marissa Guzman is a 51 y.o. female.  She has past medical history of essential hypertension and market dyslipidemia.  She is on multiple medications for the same.  She is happy that her tests have come out fine.  She had increased her dose of beta-blocker and is doing fine.  Her heart rate is better.  She is a sedentary lifestyle.  No chest pain orthopnea or PND.  At the time of my evaluation, the patient is alert awake oriented and in no distress.  Past Medical History:  Diagnosis Date  . Acute bronchitis 05/16/2012  . Allergic state 05/16/2012  . Constipation 04/16/2014  . GERD (gastroesophageal reflux disease)  06/12/2013  . Hip pain, bilateral 08/19/2015  . HTN (hypertension)   . Hyperglycemia 08/10/2015  . Hyperlipidemia   . Hypokalemia 11/14/2012  . Overweight 08/14/2016  . Seasonal allergies     Past Surgical History:  Procedure Laterality Date  . CESAREAN SECTION    . HEMORRHOID SURGERY     two    Current Medications: Current Meds  Medication Sig  . atorvastatin (LIPITOR) 80 MG tablet Take 1 tablet (80 mg total) by mouth daily.  . cetirizine (ZYRTEC) 10 MG tablet Take 10 mg by mouth daily as needed.  . ezetimibe (ZETIA) 10 MG tablet Take 1 tablet (10 mg total) by mouth daily.  . fluticasone (FLONASE) 50 MCG/ACT nasal spray Place 2 sprays into the nose daily as needed.  Bess Harvest Ethyl (VASCEPA) 1 g CAPS Take 2 g by mouth 2 (two) times daily.  . metoprolol succinate (TOPROL-XL) 50 MG 24 hr tablet Take 2 tablets (100 mg total) by mouth daily. Take with or immediately following a meal.  . norgestrel-ethinyl estradiol (LO/OVRAL,CRYSELLE) 0.3-30 MG-MCG tablet Take 1 tablet by mouth daily.  Marland Kitchen omeprazole (PRILOSEC) 20 MG capsule Take 20 mg by mouth daily.  Marland Kitchen triamterene-hydrochlorothiazide (MAXZIDE-25) 37.5-25 MG tablet TAKE 1 TABLET BY MOUTH DAILY.     Allergies:   Niacin and related and Tetracyclines & related   Social History   Socioeconomic History  . Marital status: Married  Spouse name: None  . Number of children: None  . Years of education: None  . Highest education level: None  Social Needs  . Financial resource strain: None  . Food insecurity - worry: None  . Food insecurity - inability: None  . Transportation needs - medical: None  . Transportation needs - non-medical: None  Occupational History  . None  Tobacco Use  . Smoking status: Never Smoker  . Smokeless tobacco: Never Used  Substance and Sexual Activity  . Alcohol use: No  . Drug use: No  . 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 None  Social History Narrative  . None     Family History: The patient's family history includes Dementia in her maternal grandmother; Diabetes in her maternal grandmother; Heart disease in her father; Hyperlipidemia in her maternal grandmother, mother, and sister; Hypertension in her father, mother, and sister; Lung cancer in her father; Melanoma in her father.  ROS:   Please see the history of present illness.    All other systems reviewed and are negative.  EKGs/Labs/Other Studies Reviewed:    The following studies were reviewed today: Discussed the findings of the stress test in the carotid report with extensive length and she vocalized understanding.   Recent Labs: 11/19/2016: BUN 17; Creatinine, Ser 1.00; Hemoglobin 13.5; Platelets 278.0; Potassium 3.4; Sodium 137; TSH 1.78 01/08/2017: ALT 17  Recent Lipid Panel    Component Value Date/Time   CHOL 194 01/08/2017 0815   CHOL 185 11/29/2013 0911   TRIG 178 (H) 01/08/2017 0815   TRIG 160 (H) 11/29/2013 0911   HDL 36 (L) 01/08/2017 0815   HDL 37 (L) 11/29/2013 0911   CHOLHDL 5.4 (H) 01/08/2017 0815   CHOLHDL 7 11/19/2016 0828   VLDL 45.2 (H) 11/19/2016 0828   LDLCALC 122 (H) 01/08/2017 0815   LDLCALC 116 (H) 11/29/2013 0911   LDLDIRECT 157.0 11/19/2016 0828    Physical Exam:    VS:  BP 138/80 (BP Location: Left Arm, Patient Position: Sitting)   Pulse 94   Ht 5\' 3"  (1.6 m)   Wt 159 lb 1.9 oz (72.2 kg)   SpO2 97%   BMI 28.19 kg/m     Wt Readings from Last 3 Encounters:  02/11/17 159 lb 1.9 oz (72.2 kg)  01/12/17 158 lb 1.9 oz (71.7 kg)  12/11/16 159 lb (72.1 kg)     GEN: Patient is in no acute distress HEENT: Normal NECK: No JVD; No carotid bruits LYMPHATICS: No lymphadenopathy CARDIAC: Hear sounds regular, 2/6 systolic murmur at the apex. RESPIRATORY:  Clear to auscultation without rales, wheezing or rhonchi  ABDOMEN: Soft, non-tender,  non-distended MUSCULOSKELETAL:  No edema; No deformity  SKIN: Warm and dry NEUROLOGIC:  Alert and oriented x 3 PSYCHIATRIC:  Normal affect   Signed, 12/13/16, MD  02/11/2017 9:56 AM    [facility]

## 2017-02-11 NOTE — Patient Instructions (Signed)
Medication Instructions:  Your physician has recommended you make the following change in your medication:  CHANGE Metoprolol to 75 mg in the morning and 50 mg in the evening Refill for zetia and metoprolol was given  Labwork: Your physician recommends that you have the following labs drawn: BMP and Liver/Lipid panel  Testing/Procedures: None  Follow-Up: Your physician recommends that you schedule a follow-up appointment in: 6 months   Any Other Special Instructions Will Be Listed Below (If Applicable).     If you need a refill on your cardiac medications before your next appointment, please call your pharmacy.   [facility]  Garey Ham, RN, BSN

## 2017-02-13 ENCOUNTER — Ambulatory Visit: Payer: BLUE CROSS/BLUE SHIELD | Admitting: Family Medicine

## 2017-03-09 MED FILL — ELINEST-28 TABLET: 0.3-30 | 63 days supply | Qty: 84 | Fill #0

## 2017-03-11 ENCOUNTER — Other Ambulatory Visit: Payer: Self-pay | Admitting: Cardiology

## 2017-03-11 DIAGNOSIS — E782 Mixed hyperlipidemia: Secondary | ICD-10-CM

## 2017-03-11 DIAGNOSIS — I10 Essential (primary) hypertension: Secondary | ICD-10-CM

## 2017-03-11 DIAGNOSIS — R079 Chest pain, unspecified: Secondary | ICD-10-CM

## 2017-03-11 MED FILL — TRIAMTERENE-HCTZ 37.5-25 MG: 37.5-25 | 30 days supply | Qty: 30 | Fill #1

## 2017-03-11 MED FILL — METOPROLOL SUCC ER 50 MG TA: 50 | 72 days supply | Qty: 180 | Fill #0

## 2017-03-13 DIAGNOSIS — I10 Essential (primary) hypertension: Secondary | ICD-10-CM | POA: Diagnosis not present

## 2017-03-13 DIAGNOSIS — E7800 Pure hypercholesterolemia, unspecified: Secondary | ICD-10-CM | POA: Diagnosis not present

## 2017-03-14 LAB — LIPID PANEL
CHOL/HDL RATIO: 5.7 ratio — AB (ref 0.0–4.4)
Cholesterol, Total: 206 mg/dL — ABNORMAL HIGH (ref 100–199)
HDL: 36 mg/dL — AB (ref >39)
LDL Calculated: 111 mg/dL — ABNORMAL HIGH (ref 0–99)
TRIGLYCERIDES: 296 mg/dL — AB (ref 0–149)
VLDL CHOLESTEROL CAL: 59 mg/dL — AB (ref 5–40)

## 2017-03-14 LAB — BASIC METABOLIC PANEL
BUN/Creatinine Ratio: 20 (ref 9–23)
BUN: 20 mg/dL (ref 6–24)
CALCIUM: 9.3 mg/dL (ref 8.7–10.2)
CHLORIDE: 97 mmol/L (ref 96–106)
CO2: 20 mmol/L (ref 20–29)
Creatinine, Ser: 0.98 mg/dL (ref 0.57–1.00)
GFR calc Af Amer: 77 mL/min/{1.73_m2} (ref >59)
GFR calc non Af Amer: 67 mL/min/{1.73_m2} (ref >59)
GLUCOSE: 93 mg/dL (ref 65–99)
POTASSIUM: 4.2 mmol/L (ref 3.5–5.2)
SODIUM: 137 mmol/L (ref 134–144)

## 2017-03-14 LAB — HEPATIC FUNCTION PANEL
ALBUMIN: 3.9 g/dL (ref 3.5–5.5)
ALT: 28 IU/L (ref 0–32)
AST: 38 IU/L (ref 0–40)
Alkaline Phosphatase: 59 IU/L (ref 39–117)
BILIRUBIN TOTAL: 1.1 mg/dL (ref 0.0–1.2)
Bilirubin, Direct: 0.22 mg/dL (ref 0.00–0.40)
Total Protein: 6.9 g/dL (ref 6.0–8.5)

## 2017-03-16 ENCOUNTER — Other Ambulatory Visit: Payer: Self-pay

## 2017-03-16 ENCOUNTER — Encounter: Payer: Self-pay | Admitting: Family Medicine

## 2017-03-16 ENCOUNTER — Ambulatory Visit: Payer: BLUE CROSS/BLUE SHIELD | Admitting: Family Medicine

## 2017-03-16 DIAGNOSIS — E782 Mixed hyperlipidemia: Secondary | ICD-10-CM

## 2017-03-16 DIAGNOSIS — K219 Gastro-esophageal reflux disease without esophagitis: Secondary | ICD-10-CM

## 2017-03-16 DIAGNOSIS — I10 Essential (primary) hypertension: Secondary | ICD-10-CM | POA: Diagnosis not present

## 2017-03-16 DIAGNOSIS — R739 Hyperglycemia, unspecified: Secondary | ICD-10-CM

## 2017-03-16 MED ORDER — LISINOPRIL 10 MG PO TABS: 10.0000 mg | ORAL_TABLET | Freq: Two times a day (BID) | ORAL | 3 refills | Status: DC

## 2017-03-16 MED FILL — LISINOPRIL 10 MG TABS: 10 | 30 days supply | Qty: 60 | Fill #0

## 2017-03-16 NOTE — Assessment & Plan Note (Signed)
Avoid offending foods, start probiotics. Do not eat large meals in late evening and consider raising head of bed. Continue current meds and try a dose of Mylanta prn when chest symptoms develop. Seek care if symptoms are severe and do not resolve

## 2017-03-16 NOTE — Progress Notes (Signed)
Subjective:  I acted as a Neurosurgeon for [employer] Fuller Song, RMA   Patient ID: Marissa Guzman, female    DOB: 29-Mar-1965, 51 y.o.   MRN: [id number]  Chief Complaint  Patient presents with  . Follow-up    HPI  Patient is in today for follow up visit and overall she is doing well.  She has been having episodes of atypical chest discomfort off and on for some time and cardiac workup has been unremarkable and her symptoms continue to occur intermittently has been following with cardiology.  Sometimes a couple times a week.  She often has the chest discomfort when lying down at night but it can occur during the day as well.  There are no associated symptoms such as shortness of breath, diaphoresis or palpitations.  She does endorse some mild dyspepsia at times.  No sour fluid in the mouth or choking.  No recent febrile illness or other acute concerns are noted today. Denies palp/SOB/HA/congestion/fevers or GU c/o. Taking meds as prescribed  Patient Care Team: Bradd Canary, MD as PCP - General (Family Medicine) Blondell Reveal, MD as Consulting Physician (Obstetrics and Gynecology)   Past Medical History:  Diagnosis Date  . Acute bronchitis 05/16/2012  . Allergic state 05/16/2012  . Constipation 04/16/2014  . GERD (gastroesophageal reflux disease) 06/12/2013  . Hip pain, bilateral 08/19/2015  . HTN (hypertension)   . Hyperglycemia 08/10/2015  . Hyperlipidemia   . Hypokalemia 11/14/2012  . Overweight 08/14/2016  . Seasonal allergies     Past Surgical History:  Procedure Laterality Date  . CESAREAN SECTION    . HEMORRHOID SURGERY     two    Family History  Problem Relation Age of Onset  . Heart disease Father        Deceased  . Hypertension Father   . Lung cancer Father   . Melanoma Father        Eye Removal  . Hyperlipidemia Mother   . Hypertension Mother   . Hyperlipidemia Maternal Grandmother   . Diabetes Maternal Grandmother   . Dementia Maternal Grandmother   .  Hypertension Sister        x2  . Hyperlipidemia Sister        x2    Social History   Socioeconomic History  . Marital status: Married    Spouse name: Not on file  . Number of children: Not on file  . Years of education: Not on file  . Highest education level: Not on file  Social Needs  . Financial resource strain: Not on file  . Food insecurity - worry: Not on file  . Food insecurity - inability: Not on file  . Transportation needs - medical: Not on file  . Transportation needs - non-medical: Not on file  Occupational History  . Not on file  Tobacco Use  . Smoking status: Never Smoker  . Smokeless tobacco: Never Used  Substance and Sexual Activity  . Alcohol use: No  . Drug use: No  . 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 Not on file  Social History Narrative  . Not on file    Outpatient Medications Prior to Visit  Medication Sig Dispense Refill  . atorvastatin (LIPITOR) 80 MG tablet Take 1 tablet (80 mg total) by mouth daily. 90 tablet 1  . cetirizine (ZYRTEC) 10 MG tablet Take 10  mg by mouth daily as needed.    . ezetimibe (ZETIA) 10 MG tablet Take 1 tablet (10 mg total) by mouth daily. 90 tablet 2  . fluticasone (FLONASE) 50 MCG/ACT nasal spray Place 2 sprays into the nose daily as needed.    . metoprolol succinate (TOPROL-XL) 50 MG 24 hr tablet Take 2 tablets (100 mg total) by mouth daily. Take with or immediately following a meal. Take 75 mg in the morning and 50 mg in the evening. 180 tablet 2  . norgestrel-ethinyl estradiol (LO/OVRAL,CRYSELLE) 0.3-30 MG-MCG tablet Take 1 tablet by mouth daily. 1 Package 0  . omeprazole (PRILOSEC) 20 MG capsule Take 20 mg by mouth daily.    Marland Kitchen triamterene-hydrochlorothiazide (MAXZIDE-25) 37.5-25 MG tablet TAKE 1 TABLET BY MOUTH DAILY. 30 tablet 1  . nitroGLYCERIN (NITROSTAT) 0.4 MG SL tablet Place 1 tablet (0.4  mg total) under the tongue every 5 (five) minutes as needed for chest pain. 90 tablet 3  . VASCEPA 1 g CAPS TAKE 2 CAPSULES BY MOUTH 2 (TWO) TIMES DAILY. 180 capsule 1   No facility-administered medications prior to visit.     Allergies  Allergen Reactions  . Niacin And Related     Extreme flushing despite taking it correctly  . Tetracyclines & Related Other (See Comments)    GI Issues.    Review of Systems  Constitutional: Negative for fever and malaise/fatigue.  HENT: Negative for congestion.   Eyes: Negative for blurred vision.  Respiratory: Negative for shortness of breath.   Cardiovascular: Positive for chest pain. Negative for palpitations and leg swelling.  Gastrointestinal: Positive for heartburn. Negative for abdominal pain, blood in stool and nausea.  Genitourinary: Negative for dysuria and frequency.  Musculoskeletal: Negative for falls.  Skin: Positive for itching. Negative for rash.  Neurological: Negative for dizziness, loss of consciousness and headaches.  Endo/Heme/Allergies: Negative for environmental allergies.  Psychiatric/Behavioral: Negative for depression. The patient is not nervous/anxious.        Objective:    Physical Exam  Constitutional: She is oriented to person, place, and time. She appears well-developed and well-nourished. No distress.  HENT:  Head: Normocephalic and atraumatic.  Nose: Nose normal.  Eyes: Right eye exhibits no discharge. Left eye exhibits no discharge.  Neck: Normal range of motion. Neck supple.  Cardiovascular: Normal rate and regular rhythm.  No murmur heard. Pulmonary/Chest: Effort normal and breath sounds normal.  Abdominal: Soft. Bowel sounds are normal. There is no tenderness.  Musculoskeletal: She exhibits no edema.  Neurological: She is alert and oriented to person, place, and time.  Skin: Skin is warm and dry.  Psychiatric: She has a normal mood and affect.  Nursing note and vitals reviewed.   BP (!) 158/96    Pulse 84   Temp 98 F (36.7 C) (Oral)   Resp 16   Ht 5' 3.5" (1.613 m)   Wt 163 lb 6.4 oz (74.1 kg)   SpO2 98%   BMI 28.49 kg/m  Wt Readings from Last 3 Encounters:  03/16/17 163 lb 6.4 oz (74.1 kg)  02/11/17 159 lb 1.9 oz (72.2 kg)  01/12/17 158 lb 1.9 oz (71.7 kg)   BP Readings from Last 3 Encounters:  03/16/17 (!) 158/96  02/11/17 138/80  01/12/17 120/84     Immunization History  Administered Date(s) Administered  . Tdap 02/01/2013    Health Maintenance  Topic Date Due  . COLONOSCOPY  06/06/2015  . HIV Screening  08/14/2017 (Originally 06/05/1980)  . INFLUENZA VACCINE  11/16/2017 (  Originally 10/15/2016)  . MAMMOGRAM  03/18/2017  . PAP SMEAR  04/18/2019  . TETANUS/TDAP  02/02/2023    Lab Results  Component Value Date   WBC 7.1 11/19/2016   HGB 13.5 11/19/2016   HCT 40.4 11/19/2016   PLT 278.0 11/19/2016   GLUCOSE 93 03/13/2017   CHOL 206 (H) 03/13/2017   TRIG 296 (H) 03/13/2017   HDL 36 (L) 03/13/2017   LDLDIRECT 157.0 11/19/2016   LDLCALC 111 (H) 03/13/2017   ALT 28 03/13/2017   AST 38 03/13/2017   NA 137 03/13/2017   K 4.2 03/13/2017   CL 97 03/13/2017   CREATININE 0.98 03/13/2017   BUN 20 03/13/2017   CO2 20 03/13/2017   TSH 1.78 11/19/2016   HGBA1C 6.3 11/19/2016    Lab Results  Component Value Date   TSH 1.78 11/19/2016   Lab Results  Component Value Date   WBC 7.1 11/19/2016   HGB 13.5 11/19/2016   HCT 40.4 11/19/2016   MCV 89.3 11/19/2016   PLT 278.0 11/19/2016   Lab Results  Component Value Date   NA 137 03/13/2017   K 4.2 03/13/2017   CO2 20 03/13/2017   GLUCOSE 93 03/13/2017   BUN 20 03/13/2017   CREATININE 0.98 03/13/2017   BILITOT 1.1 03/13/2017   ALKPHOS 59 03/13/2017   AST 38 03/13/2017   ALT 28 03/13/2017   PROT 6.9 03/13/2017   ALBUMIN 3.9 03/13/2017   CALCIUM 9.3 03/13/2017   GFR 62.01 11/19/2016   Lab Results  Component Value Date   CHOL 206 (H) 03/13/2017   Lab Results  Component Value Date   HDL 36 (L)  03/13/2017   Lab Results  Component Value Date   LDLCALC 111 (H) 03/13/2017   Lab Results  Component Value Date   TRIG 296 (H) 03/13/2017   Lab Results  Component Value Date   CHOLHDL 5.7 (H) 03/13/2017   Lab Results  Component Value Date   HGBA1C 6.3 11/19/2016         Assessment & Plan:   Problem List Items Addressed This Visit    Hypertension, benign essential, goal below 140/90    Poor control off of ACE Inhibitor even on BP recheck will restart Lisinopril 10 mg po bid and check bp and cmp in 3 weeks. Encouraged heart healthy diet such as the DASH diet and exercise as tolerated.       Relevant Medications   lisinopril (PRINIVIL,ZESTRIL) 10 MG tablet   GERD (gastroesophageal reflux disease)    Avoid offending foods, start probiotics. Do not eat large meals in late evening and consider raising head of bed. Continue current meds and try a dose of Mylanta prn when chest symptoms develop. Seek care if symptoms are severe and do not resolve      Hyperglycemia    minimize simple carbs. Increase exercise as tolerated. C         I am having Marissa Guzman start on lisinopril. I am also having her maintain her fluticasone, cetirizine, norgestrel-ethinyl estradiol, omeprazole, nitroGLYCERIN, atorvastatin, triamterene-hydrochlorothiazide, metoprolol succinate, ezetimibe, and VASCEPA.  Meds ordered this encounter  Medications  . lisinopril (PRINIVIL,ZESTRIL) 10 MG tablet    Sig: Take 1 tablet (10 mg total) by mouth 2 (two) times daily.    Dispense:  60 tablet    Refill:  3    CMA served as scribe during this visit. History, Physical and Plan performed by medical provider. Documentation and orders reviewed and attested to.  Misty Stanley  Charlett Blake, MD

## 2017-03-16 NOTE — Assessment & Plan Note (Signed)
Poor control off of ACE Inhibitor even on BP recheck will restart Lisinopril 10 mg po bid and check bp and cmp in 3 weeks. Encouraged heart healthy diet such as the DASH diet and exercise as tolerated.

## 2017-03-16 NOTE — Patient Instructions (Addendum)
Try Mylanta when you wake up at night with the chest discomfort For the symptoms in the chest wall and arms consider chiropractic care. Massage therapy and Physical therapy.   Hypertension Hypertension is another name for high blood pressure. High blood pressure forces your heart to work harder to pump blood. This can cause problems over time. There are two numbers in a blood pressure reading. There is a top number (systolic) over a bottom number (diastolic). It is best to have a blood pressure below 120/80. Healthy choices can help lower your blood pressure. You may need medicine to help lower your blood pressure if:  Your blood pressure cannot be lowered with healthy choices.  Your blood pressure is higher than 130/80.  Follow these instructions at home: Eating and drinking  If directed, follow the DASH eating plan. This diet includes: ? Filling half of your plate at each meal with fruits and vegetables. ? Filling one quarter of your plate at each meal with whole grains. Whole grains include whole wheat pasta, brown rice, and whole grain bread. ? Eating or drinking low-fat dairy products, such as skim milk or low-fat yogurt. ? Filling one quarter of your plate at each meal with low-fat (lean) proteins. Low-fat proteins include fish, skinless chicken, eggs, beans, and tofu. ? Avoiding fatty meat, cured and processed meat, or chicken with skin. ? Avoiding premade or processed food.  Eat less than 1,500 mg of salt (sodium) a day.  Limit alcohol use to no more than 1 drink a day for nonpregnant women and 2 drinks a day for men. One drink equals 12 oz of beer, 5 oz of wine, or 1 oz of hard liquor. Lifestyle  Work with your doctor to stay at a healthy weight or to lose weight. Ask your doctor what the best weight is for you.  Get at least 30 minutes of exercise that causes your heart to beat faster (aerobic exercise) most days of the week. This may include walking, swimming, or  biking.  Get at least 30 minutes of exercise that strengthens your muscles (resistance exercise) at least 3 days a week. This may include lifting weights or pilates.  Do not use any products that contain nicotine or tobacco. This includes cigarettes and e-cigarettes. If you need help quitting, ask your doctor.  Check your blood pressure at home as told by your doctor.  Keep all follow-up visits as told by your doctor. This is important. Medicines  Take over-the-counter and prescription medicines only as told by your doctor. Follow directions carefully.  Do not skip doses of blood pressure medicine. The medicine does not work as well if you skip doses. Skipping doses also puts you at risk for problems.  Ask your doctor about side effects or reactions to medicines that you should watch for. Contact a doctor if:  You think you are having a reaction to the medicine you are taking.  You have headaches that keep coming back (recurring).  You feel dizzy.  You have swelling in your ankles.  You have trouble with your vision. Get help right away if:  You get a very bad headache.  You start to feel confused.  You feel weak or numb.  You feel faint.  You get very bad pain in your: ? Chest. ? Belly (abdomen).  You throw up (vomit) more than once.  You have trouble breathing. Summary  Hypertension is another name for high blood pressure.  Making healthy choices can help lower blood pressure.  If your blood pressure cannot be controlled with healthy choices, you may need to take medicine. This information is not intended to replace advice given to you by your health care provider. Make sure you discuss any questions you have with your health care provider. Document Released: 08/20/2007 Document Revised: 01/30/2016 Document Reviewed: 01/30/2016 Elsevier Interactive Patient Education  [employer].

## 2017-03-16 NOTE — Assessment & Plan Note (Signed)
minimize simple carbs. Increase exercise as tolerated. C

## 2017-03-16 NOTE — Addendum Note (Signed)
Addended by: Craige Cotta on: 03/16/2017 04:05 PM   Modules accepted: Orders

## 2017-03-18 MED FILL — VASCEPA 1 GM CAPSULE: 1 | 45 days supply | Qty: 180 | Fill #0

## 2017-03-23 DIAGNOSIS — H40013 Open angle with borderline findings, low risk, bilateral: Secondary | ICD-10-CM | POA: Diagnosis not present

## 2017-03-23 DIAGNOSIS — H01021 Squamous blepharitis right upper eyelid: Secondary | ICD-10-CM | POA: Diagnosis not present

## 2017-03-23 DIAGNOSIS — H1851 Endothelial corneal dystrophy: Secondary | ICD-10-CM | POA: Diagnosis not present

## 2017-03-23 DIAGNOSIS — H01022 Squamous blepharitis right lower eyelid: Secondary | ICD-10-CM | POA: Diagnosis not present

## 2017-03-24 DIAGNOSIS — L821 Other seborrheic keratosis: Secondary | ICD-10-CM | POA: Diagnosis not present

## 2017-03-24 DIAGNOSIS — D1801 Hemangioma of skin and subcutaneous tissue: Secondary | ICD-10-CM | POA: Diagnosis not present

## 2017-03-24 DIAGNOSIS — D225 Melanocytic nevi of trunk: Secondary | ICD-10-CM | POA: Diagnosis not present

## 2017-03-24 DIAGNOSIS — L814 Other melanin hyperpigmentation: Secondary | ICD-10-CM | POA: Diagnosis not present

## 2017-04-01 ENCOUNTER — Ambulatory Visit (INDEPENDENT_AMBULATORY_CARE_PROVIDER_SITE_OTHER): Payer: BLUE CROSS/BLUE SHIELD | Admitting: Family Medicine

## 2017-04-01 ENCOUNTER — Other Ambulatory Visit (INDEPENDENT_AMBULATORY_CARE_PROVIDER_SITE_OTHER): Payer: Self-pay

## 2017-04-01 VITALS — BP 129/86 | HR 88

## 2017-04-01 DIAGNOSIS — I10 Essential (primary) hypertension: Secondary | ICD-10-CM | POA: Diagnosis not present

## 2017-04-01 DIAGNOSIS — E1159 Type 2 diabetes mellitus with other circulatory complications: Secondary | ICD-10-CM | POA: Diagnosis not present

## 2017-04-01 DIAGNOSIS — I152 Hypertension secondary to endocrine disorders: Secondary | ICD-10-CM

## 2017-04-01 LAB — COMPREHENSIVE METABOLIC PANEL
ALT: 32 U/L (ref 0–35)
AST: 42 U/L — AB (ref 0–37)
Albumin: 4.1 g/dL (ref 3.5–5.2)
Alkaline Phosphatase: 52 U/L (ref 39–117)
BUN: 19 mg/dL (ref 6–23)
CHLORIDE: 101 meq/L (ref 96–112)
CO2: 27 meq/L (ref 19–32)
CREATININE: 1.07 mg/dL (ref 0.40–1.20)
Calcium: 9.6 mg/dL (ref 8.4–10.5)
GFR: 57.27 mL/min — AB (ref >60.00)
Glucose, Bld: 118 mg/dL — ABNORMAL HIGH (ref 70–99)
POTASSIUM: 4.5 meq/L (ref 3.5–5.1)
Sodium: 137 mEq/L (ref 135–145)
Total Bilirubin: 0.8 mg/dL (ref 0.2–1.2)
Total Protein: 7.4 g/dL (ref 6.0–8.3)

## 2017-04-01 NOTE — Progress Notes (Signed)
Noted. Agree with above.  Jilda Roche Doniphan, DO 04/01/17 3:19 PM

## 2017-04-01 NOTE — Progress Notes (Signed)
MPre visit review using our clinic tool,if applicable. No additional management support is needed unless otherwise documented below in the visit note.   Patient in for BP check and CMP per order from Dr. Mariel Aloe dated 03/16/2017.    BP on that date = 158/96 P = 84 Dr. Abner Greenspan started Lisinopril; 10 mg BID.   No complaints voiced this visit. Patient went to lab for CMP prior to visit.  BP this visit = 129/86  P= 88  Per Dr. Carmelia Roller DOD patient to continue taking medications as ordered and return for scheduled follow up visit with Dr. Abner Greenspan.  Patient agreed.

## 2017-04-13 ENCOUNTER — Other Ambulatory Visit: Payer: Self-pay | Admitting: Family Medicine

## 2017-04-13 MED FILL — TRIAMTERENE-HCTZ 37.5-25 MG: 37.5-25 | 30 days supply | Qty: 30 | Fill #0

## 2017-04-13 MED FILL — LISINOPRIL 10 MG TABS: 10 | 30 days supply | Qty: 60 | Fill #1

## 2017-04-22 DIAGNOSIS — E782 Mixed hyperlipidemia: Secondary | ICD-10-CM | POA: Diagnosis not present

## 2017-04-23 ENCOUNTER — Other Ambulatory Visit: Payer: Self-pay

## 2017-04-23 DIAGNOSIS — E7800 Pure hypercholesterolemia, unspecified: Secondary | ICD-10-CM

## 2017-04-23 LAB — LIPID PANEL
Chol/HDL Ratio: 5.8 ratio — ABNORMAL HIGH (ref 0.0–4.4)
Cholesterol, Total: 187 mg/dL (ref 100–199)
HDL: 32 mg/dL — ABNORMAL LOW (ref >39)
LDL CALC: 111 mg/dL — AB (ref 0–99)
Triglycerides: 222 mg/dL — ABNORMAL HIGH (ref 0–149)
VLDL Cholesterol Cal: 44 mg/dL — ABNORMAL HIGH (ref 5–40)

## 2017-04-23 LAB — HEPATIC FUNCTION PANEL
ALBUMIN: 4.3 g/dL (ref 3.5–5.5)
ALT: 31 IU/L (ref 0–32)
AST: 38 IU/L (ref 0–40)
Alkaline Phosphatase: 60 IU/L (ref 39–117)
BILIRUBIN TOTAL: 0.8 mg/dL (ref 0.0–1.2)
BILIRUBIN, DIRECT: 0.19 mg/dL (ref 0.00–0.40)
Total Protein: 7 g/dL (ref 6.0–8.5)

## 2017-05-06 DIAGNOSIS — Z01419 Encounter for gynecological examination (general) (routine) without abnormal findings: Secondary | ICD-10-CM | POA: Diagnosis not present

## 2017-05-06 DIAGNOSIS — I10 Essential (primary) hypertension: Secondary | ICD-10-CM | POA: Diagnosis not present

## 2017-05-06 DIAGNOSIS — Z3041 Encounter for surveillance of contraceptive pills: Secondary | ICD-10-CM | POA: Diagnosis not present

## 2017-05-06 DIAGNOSIS — Z1151 Encounter for screening for human papillomavirus (HPV): Secondary | ICD-10-CM | POA: Diagnosis not present

## 2017-05-06 DIAGNOSIS — E785 Hyperlipidemia, unspecified: Secondary | ICD-10-CM | POA: Diagnosis not present

## 2017-05-06 LAB — HM PAP SMEAR: HM Pap smear: NORMAL

## 2017-05-06 MED FILL — ELINEST-28 TABLET: 0.3-30 | 63 days supply | Qty: 84 | Fill #0

## 2017-05-12 MED FILL — ATORVASTATIN 80 MG TABLET: 80 | 90 days supply | Qty: 90 | Fill #1

## 2017-05-12 MED FILL — VASCEPA 1 GM CAPSULE: 1 | 45 days supply | Qty: 180 | Fill #1

## 2017-05-12 MED FILL — LISINOPRIL 10 MG TABS: 10 | 30 days supply | Qty: 60 | Fill #2

## 2017-05-12 MED FILL — TRIAMTERENE-HCTZ 37.5-25 MG: 37.5-25 | 30 days supply | Qty: 30 | Fill #1

## 2017-05-12 MED FILL — METOPROLOL SUCCINATE ER 50: 50 | 72 days supply | Qty: 180 | Fill #1

## 2017-05-12 MED FILL — EZETIMIBE 10 MG TABS: 10 | 90 days supply | Qty: 90 | Fill #0

## 2017-05-25 ENCOUNTER — Ambulatory Visit: Payer: BLUE CROSS/BLUE SHIELD | Admitting: Family Medicine

## 2017-05-25 ENCOUNTER — Encounter: Payer: Self-pay | Admitting: Family Medicine

## 2017-05-25 VITALS — BP 136/83 | HR 82 | Temp 98.6°F | Resp 18 | Wt 159.4 lb

## 2017-05-25 DIAGNOSIS — E7800 Pure hypercholesterolemia, unspecified: Secondary | ICD-10-CM

## 2017-05-25 DIAGNOSIS — E663 Overweight: Secondary | ICD-10-CM | POA: Diagnosis not present

## 2017-05-25 DIAGNOSIS — R739 Hyperglycemia, unspecified: Secondary | ICD-10-CM | POA: Diagnosis not present

## 2017-05-25 LAB — HEMOGLOBIN A1C: HEMOGLOBIN A1C: 6.7 % — AB (ref 4.6–6.5)

## 2017-05-25 NOTE — Assessment & Plan Note (Signed)
hgba1c acceptable, minimize simple carbs. Increase exercise as tolerated.  

## 2017-05-25 NOTE — Progress Notes (Signed)
Subjective:  I acted as a Neurosurgeon for Dr. Abner Greenspan. [place], [place]  Patient ID: Marissa Guzman, female    DOB: 12-Sep-1965, 52 y.o.   MRN: [id number]  No chief complaint on file.   HPI  Patient is in today for a 2 month follow up and she reports feeling well. No recent febrile illness or hospitalizations. She is exercising regularly but does note does not always eat well. No polyuria or polydipsia. She does not have a family history of diabetes. Denies CP/palp/SOB/HA/congestion/fevers/GI or GU c/o. Taking meds as prescribed  Patient Care Team: Bradd Canary, MD as PCP - General (Family Medicine) Blondell Reveal, MD as Consulting Physician (Obstetrics and Gynecology)   Past Medical History:  Diagnosis Date  . Acute bronchitis 05/16/2012  . Allergic state 05/16/2012  . Constipation 04/16/2014  . GERD (gastroesophageal reflux disease) 06/12/2013  . Hip pain, bilateral 08/19/2015  . HTN (hypertension)   . Hyperglycemia 08/10/2015  . Hyperlipidemia   . Hypokalemia 11/14/2012  . Overweight 08/14/2016  . Seasonal allergies     Past Surgical History:  Procedure Laterality Date  . CESAREAN SECTION    . HEMORRHOID SURGERY     two    Family History  Problem Relation Age of Onset  . Heart disease Father        Deceased  . Hypertension Father   . Lung cancer Father   . Melanoma Father        Eye Removal  . Hyperlipidemia Mother   . Hypertension Mother   . Hyperlipidemia Maternal Grandmother   . Diabetes Maternal Grandmother   . Dementia Maternal Grandmother   . Hypertension Sister        x2  . Hyperlipidemia Sister        x2    Social History   Socioeconomic History  . Marital status: Married    Spouse name: Not on file  . Number of children: Not on file  . Years of education: Not on file  . Highest education level: Not on file  Social Needs  . Financial resource strain: Not on file  . Food insecurity - worry: Not on file  . Food insecurity - inability: Not on file  .  Transportation needs - medical: Not on file  . Transportation needs - non-medical: Not on file  Occupational History  . Not on file  Tobacco Use  . Smoking status: Never Smoker  . Smokeless tobacco: Never Used  Substance and Sexual Activity  . Alcohol use: No  . Drug use: No  . 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 Not on file  Social History Narrative  . Not on file    Outpatient Medications Prior to Visit  Medication Sig Dispense Refill  . atorvastatin (LIPITOR) 80 MG tablet Take 1 tablet (80 mg total) by mouth daily. 90 tablet 1  . cetirizine (ZYRTEC) 10 MG tablet Take 10 mg by mouth daily as needed.    . ezetimibe (ZETIA) 10 MG tablet Take 1 tablet (10 mg total) by mouth daily. 90 tablet 2  . fluticasone (FLONASE) 50 MCG/ACT nasal spray Place 2 sprays into the nose daily as needed.    Marland Kitchen lisinopril (PRINIVIL,ZESTRIL) 10 MG tablet Take 1 tablet (10 mg total) by mouth 2 (two) times daily. 60 tablet 3  . metoprolol succinate (TOPROL-XL) 50 MG 24 hr tablet Take  2 tablets (100 mg total) by mouth daily. Take with or immediately following a meal. Take 75 mg in the morning and 50 mg in the evening. 180 tablet 2  . norgestrel-ethinyl estradiol (LO/OVRAL,CRYSELLE) 0.3-30 MG-MCG tablet Take 1 tablet by mouth daily. 1 Package 0  . omeprazole (PRILOSEC) 20 MG capsule Take 20 mg by mouth daily.    Marland Kitchen triamterene-hydrochlorothiazide (MAXZIDE-25) 37.5-25 MG tablet TAKE 1 TABLET BY MOUTH DAILY. 30 tablet 1  . VASCEPA 1 g CAPS TAKE 2 CAPSULES BY MOUTH 2 (TWO) TIMES DAILY. 180 capsule 1  . nitroGLYCERIN (NITROSTAT) 0.4 MG SL tablet Place 1 tablet (0.4 mg total) under the tongue every 5 (five) minutes as needed for chest pain. 90 tablet 3   No facility-administered medications prior to visit.     Allergies  Allergen Reactions  . Niacin And Related     Extreme flushing despite  taking it correctly  . Tetracyclines & Related Other (See Comments)    GI Issues.    Review of Systems  Constitutional: Negative for fever and malaise/fatigue.  HENT: Negative for congestion.   Eyes: Negative for blurred vision.  Respiratory: Negative for shortness of breath.   Cardiovascular: Negative for chest pain, palpitations and leg swelling.  Gastrointestinal: Negative for abdominal pain, blood in stool and nausea.  Genitourinary: Negative for dysuria and frequency.  Musculoskeletal: Negative for falls.  Skin: Negative for rash.  Neurological: Negative for dizziness, loss of consciousness and headaches.  Endo/Heme/Allergies: Negative for environmental allergies.  Psychiatric/Behavioral: Negative for depression. The patient is not nervous/anxious.        Objective:    Physical Exam  Constitutional: She is oriented to person, place, and time. She appears well-developed and well-nourished. No distress.  HENT:  Head: Normocephalic and atraumatic.  Nose: Nose normal.  Eyes: Right eye exhibits no discharge. Left eye exhibits no discharge.  Neck: Normal range of motion. Neck supple.  Cardiovascular: Normal rate and regular rhythm.  No murmur heard. Pulmonary/Chest: Effort normal and breath sounds normal.  Abdominal: Soft. Bowel sounds are normal. There is no tenderness.  Musculoskeletal: She exhibits no edema.  Neurological: She is alert and oriented to person, place, and time.  Skin: Skin is warm and dry.  Psychiatric: She has a normal mood and affect.  Nursing note and vitals reviewed.   BP 136/83 (BP Location: Left Arm, Patient Position: Sitting, Cuff Size: Normal)   Pulse 82   Temp 98.6 F (37 C) (Oral)   Resp 18   Wt 159 lb 6.4 oz (72.3 kg)   SpO2 98%   BMI 27.79 kg/m  Wt Readings from Last 3 Encounters:  05/25/17 159 lb 6.4 oz (72.3 kg)  03/16/17 163 lb 6.4 oz (74.1 kg)  02/11/17 159 lb 1.9 oz (72.2 kg)   BP Readings from Last 3 Encounters:  05/25/17  136/83  04/01/17 129/86  03/16/17 (!) 158/96     Immunization History  Administered Date(s) Administered  . Tdap 02/01/2013    Health Maintenance  Topic Date Due  . PNEUMOCOCCAL POLYSACCHARIDE VACCINE (1) 06/06/1967  . FOOT EXAM  06/06/1975  . OPHTHALMOLOGY EXAM  06/06/1975  . COLONOSCOPY  06/06/2015  . MAMMOGRAM  03/18/2017  . HEMOGLOBIN A1C  05/19/2017  . HIV Screening  08/14/2017 (Originally 06/05/1980)  . INFLUENZA VACCINE  11/16/2017 (Originally 10/15/2016)  . PAP SMEAR  04/18/2019  . TETANUS/TDAP  02/02/2023    Lab Results  Component Value Date   WBC 7.1 11/19/2016   HGB 13.5 11/19/2016  HCT 40.4 11/19/2016   PLT 278.0 11/19/2016   GLUCOSE 118 (H) 04/01/2017   CHOL 187 04/22/2017   TRIG 222 (H) 04/22/2017   HDL 32 (L) 04/22/2017   LDLDIRECT 157.0 11/19/2016   LDLCALC 111 (H) 04/22/2017   ALT 31 04/22/2017   AST 38 04/22/2017   NA 137 04/01/2017   K 4.5 04/01/2017   CL 101 04/01/2017   CREATININE 1.07 04/01/2017   BUN 19 04/01/2017   CO2 27 04/01/2017   TSH 1.78 11/19/2016   HGBA1C 6.3 11/19/2016    Lab Results  Component Value Date   TSH 1.78 11/19/2016   Lab Results  Component Value Date   WBC 7.1 11/19/2016   HGB 13.5 11/19/2016   HCT 40.4 11/19/2016   MCV 89.3 11/19/2016   PLT 278.0 11/19/2016   Lab Results  Component Value Date   NA 137 04/01/2017   K 4.5 04/01/2017   CO2 27 04/01/2017   GLUCOSE 118 (H) 04/01/2017   BUN 19 04/01/2017   CREATININE 1.07 04/01/2017   BILITOT 0.8 04/22/2017   ALKPHOS 60 04/22/2017   AST 38 04/22/2017   ALT 31 04/22/2017   PROT 7.0 04/22/2017   ALBUMIN 4.3 04/22/2017   CALCIUM 9.6 04/01/2017   GFR 57.27 (L) 04/01/2017   Lab Results  Component Value Date   CHOL 187 04/22/2017   Lab Results  Component Value Date   HDL 32 (L) 04/22/2017   Lab Results  Component Value Date   LDLCALC 111 (H) 04/22/2017   Lab Results  Component Value Date   TRIG 222 (H) 04/22/2017   Lab Results  Component Value  Date   CHOLHDL 5.8 (H) 04/22/2017   Lab Results  Component Value Date   HGBA1C 6.3 11/19/2016         Assessment & Plan:   Problem List Items Addressed This Visit    Hypercholesterolemia    encouraged heart healthy diet, avoid trans fats, minimize simple carbs and saturated fats. Increase exercise as tolerated      Hyperglycemia - Primary    hgba1c acceptable, minimize simple carbs. Increase exercise as tolerated.       Relevant Orders   Hemoglobin A1c   Overweight    Encouraged DASH diet, decrease po intake and increase exercise as tolerated. Needs 7-8 hours of sleep nightly. Avoid trans fats, eat small, frequent meals every 4-5 hours with lean proteins, complex carbs and healthy fats. Minimize simple carbs         I am having Kileigh B. Millikin maintain her fluticasone, cetirizine, norgestrel-ethinyl estradiol, omeprazole, nitroGLYCERIN, atorvastatin, metoprolol succinate, ezetimibe, VASCEPA, lisinopril, and triamterene-hydrochlorothiazide.  No orders of the defined types were placed in this encounter.   CMA served as Neurosurgeon during this visit. History, Physical and Plan performed by medical provider. Documentation and orders reviewed and attested to.  Danise Edge, MD

## 2017-05-25 NOTE — Assessment & Plan Note (Signed)
Encouraged DASH diet, decrease po intake and increase exercise as tolerated. Needs 7-8 hours of sleep nightly. Avoid trans fats, eat small, frequent meals every 4-5 hours with lean proteins, complex carbs and healthy fats. Minimize simple carbs 

## 2017-05-25 NOTE — Patient Instructions (Addendum)
   Shingrix is the new shingles shot, 2 shots over 2-6 months  Cholesterol Cholesterol is a fat. Your body needs a small amount of cholesterol. Cholesterol (plaque) may build up in your blood vessels (arteries). That makes you more likely to have a heart attack or stroke. You cannot feel your cholesterol level. Having a blood test is the only way to find out if your level is high. Keep your test results. Work with your doctor to keep your cholesterol at a good level. What do the results mean?  Total cholesterol is how much cholesterol is in your blood.  LDL is bad cholesterol. This is the type that can build up. Try to have low LDL.  HDL is good cholesterol. It cleans your blood vessels and carries LDL away. Try to have high HDL.  Triglycerides are fat that the body can store or burn for energy. What are good levels of cholesterol?  Total cholesterol below 200.  LDL below 100 is good for people who have health risks. LDL below 70 is good for people who have very high risks.  HDL above 40 is good. It is best to have HDL of 60 or higher.  Triglycerides below 150. How can I lower my cholesterol? Diet Follow your diet program as told by your doctor.  Choose fish, white meat chicken, or [place] that is roasted or baked. Try not to eat red meat, fried foods, sausage, or lunch meats.  Eat lots of fresh fruits and vegetables.  Choose whole grains, beans, pasta, potatoes, and cereals.  Choose olive oil, corn oil, or canola oil. Only use small amounts.  Try not to eat butter, mayonnaise, shortening, or palm kernel oils.  Try not to eat foods with trans fats.  Choose low-fat or nonfat dairy foods. ? Drink skim or nonfat milk. ? Eat low-fat or nonfat yogurt and cheeses. ? Try not to drink whole milk or cream. ? Try not to eat ice cream, egg yolks, or full-fat cheeses.  Healthy desserts include angel food cake, ginger snaps, animal crackers, hard candy, popsicles, and low-fat or  nonfat frozen yogurt. Try not to eat pastries, cakes, pies, and cookies.  Exercise Follow your exercise program as told by your doctor.  Be more active. Try gardening, walking, and taking the stairs.  Ask your doctor about ways that you can be more active.  Medicine  Take over-the-counter and prescription medicines only as told by your doctor. This information is not intended to replace advice given to you by your health care provider. Make sure you discuss any questions you have with your health care provider. Document Released: 05/30/2008 Document Revised: 10/03/2015 Document Reviewed: 09/13/2015 Elsevier Interactive Patient Education  [employer].

## 2017-05-25 NOTE — Assessment & Plan Note (Signed)
encouraged heart healthy diet, avoid trans fats, minimize simple carbs and saturated fats. Increase exercise as tolerated 

## 2017-05-28 DIAGNOSIS — Z1231 Encounter for screening mammogram for malignant neoplasm of breast: Secondary | ICD-10-CM | POA: Diagnosis not present

## 2017-05-28 LAB — HM MAMMOGRAPHY

## 2017-06-03 DIAGNOSIS — E7800 Pure hypercholesterolemia, unspecified: Secondary | ICD-10-CM | POA: Diagnosis not present

## 2017-06-04 LAB — HEPATIC FUNCTION PANEL
ALK PHOS: 54 IU/L (ref 39–117)
ALT: 21 IU/L (ref 0–32)
AST: 26 IU/L (ref 0–40)
Albumin: 4.1 g/dL (ref 3.5–5.5)
BILIRUBIN, DIRECT: 0.18 mg/dL (ref 0.00–0.40)
Bilirubin Total: 0.7 mg/dL (ref 0.0–1.2)
Total Protein: 7 g/dL (ref 6.0–8.5)

## 2017-06-04 LAB — LIPID PANEL
CHOLESTEROL TOTAL: 189 mg/dL (ref 100–199)
Chol/HDL Ratio: 5 ratio — ABNORMAL HIGH (ref 0.0–4.4)
HDL: 38 mg/dL — ABNORMAL LOW (ref >39)
LDL Calculated: 107 mg/dL — ABNORMAL HIGH (ref 0–99)
Triglycerides: 221 mg/dL — ABNORMAL HIGH (ref 0–149)
VLDL Cholesterol Cal: 44 mg/dL — ABNORMAL HIGH (ref 5–40)

## 2017-06-15 ENCOUNTER — Other Ambulatory Visit: Payer: Self-pay | Admitting: Family Medicine

## 2017-06-15 MED FILL — LISINOPRIL 10 MG TABLET: 10 | 30 days supply | Qty: 60 | Fill #3

## 2017-06-17 MED FILL — TRIAMTERENE-HCTZ 37.5-25 MG: 37.5-25 | 30 days supply | Qty: 30 | Fill #0

## 2017-07-22 ENCOUNTER — Other Ambulatory Visit: Payer: Self-pay | Admitting: Family Medicine

## 2017-07-22 ENCOUNTER — Encounter: Payer: Self-pay | Admitting: Family Medicine

## 2017-07-22 DIAGNOSIS — E7800 Pure hypercholesterolemia, unspecified: Secondary | ICD-10-CM

## 2017-07-22 DIAGNOSIS — E663 Overweight: Secondary | ICD-10-CM

## 2017-07-22 DIAGNOSIS — R739 Hyperglycemia, unspecified: Secondary | ICD-10-CM

## 2017-07-23 ENCOUNTER — Other Ambulatory Visit: Payer: Self-pay | Admitting: Family Medicine

## 2017-07-23 DIAGNOSIS — R0789 Other chest pain: Secondary | ICD-10-CM

## 2017-07-23 MED FILL — EZETIMIBE 10 MG TABLET: 10 | 90 days supply | Qty: 90 | Fill #1

## 2017-07-23 MED FILL — ELINEST-28 TABLET: 0.3-30 | 63 days supply | Qty: 84 | Fill #1

## 2017-07-23 MED FILL — ATORVASTATIN 80 MG TABLET: 80 | 90 days supply | Qty: 90 | Fill #0

## 2017-07-23 MED FILL — LISINOPRIL 10 MG TABS: 10 | 30 days supply | Qty: 60 | Fill #0

## 2017-07-23 MED FILL — TRIAMTERENE-HCTZ 37.5-25 MG: 37.5-25 | 30 days supply | Qty: 30 | Fill #1

## 2017-07-23 MED FILL — METOPROLOL SUCCINATE ER 50: 50 | 72 days supply | Qty: 180 | Fill #2

## 2017-08-06 ENCOUNTER — Encounter: Payer: Self-pay | Admitting: Cardiology

## 2017-08-06 ENCOUNTER — Ambulatory Visit: Payer: BLUE CROSS/BLUE SHIELD | Admitting: Cardiology

## 2017-08-06 VITALS — BP 136/82 | HR 100 | Ht 63.5 in | Wt 162.8 lb

## 2017-08-06 DIAGNOSIS — I10 Essential (primary) hypertension: Secondary | ICD-10-CM | POA: Diagnosis not present

## 2017-08-06 DIAGNOSIS — E7800 Pure hypercholesterolemia, unspecified: Secondary | ICD-10-CM

## 2017-08-06 NOTE — Progress Notes (Signed)
Cardiology Office Note:    Date:  08/06/2017   ID:  Marissa Guzman, DOB 08/14/1965, MRN [id number]  PCP:  Bradd Canary, MD  Cardiologist:  Garwin Brothers, MD   Referring MD: Bradd Canary, MD    ASSESSMENT:    1. Hypertension, benign essential, goal below 140/90   2. Hypercholesterolemia    PLAN:    In order of problems listed above:  1. Primary prevention stressed with the patient.  Importance of compliance with diet and medications stressed and she vocalized understanding.  Her blood pressure stable.  Importance of exercise protocol outlined.  She is asymptomatic with good effort tolerance.  Weight reduction was stressed and she is agreeable and is going to work hard towards it.  I told her to diet appropriately and reduce carbohydrates in diet in view of elevated triglycerides. 2. Patient will be seen in follow-up appointment in 6 months or earlier if the patient has any concerns    Medication Adjustments/Labs and Tests Ordered: Current medicines are reviewed at length with the patient today.  Concerns regarding medicines are outlined above.  No orders of the defined types were placed in this encounter.  No orders of the defined types were placed in this encounter.    Chief Complaint  Patient presents with  . Follow-up  . Hypertension     History of Present Illness:    Marissa Guzman is a 52 y.o. female.  Patient has history of essential hypertension and dyslipidemia.  She denies any problems at this time and takes care of activities of daily living.  No chest pain orthopnea or PND.  She leads a sedentary lifestyle and does not exercise much.  Tells me that she started walking yesterday for the first time and walk 45 minutes without any problems.  Past Medical History:  Diagnosis Date  . Acute bronchitis 05/16/2012  . Allergic state 05/16/2012  . Constipation 04/16/2014  . GERD (gastroesophageal reflux disease) 06/12/2013  . Hip pain, bilateral 08/19/2015  . HTN  (hypertension)   . Hyperglycemia 08/10/2015  . Hyperlipidemia   . Hypokalemia 11/14/2012  . Overweight 08/14/2016  . Seasonal allergies     Past Surgical History:  Procedure Laterality Date  . CESAREAN SECTION    . HEMORRHOID SURGERY     two    Current Medications: Current Meds  Medication Sig  . atorvastatin (LIPITOR) 80 MG tablet TAKE 1 TABLET (80 MG TOTAL) BY MOUTH DAILY.  . cetirizine (ZYRTEC) 10 MG tablet Take 10 mg by mouth daily as needed.  . ezetimibe (ZETIA) 10 MG tablet Take 1 tablet (10 mg total) by mouth daily.  . fluticasone (FLONASE) 50 MCG/ACT nasal spray Place 2 sprays into the nose daily as needed.  Marland Kitchen lisinopril (PRINIVIL,ZESTRIL) 10 MG tablet TAKE 1 TABLET (10 MG TOTAL) BY MOUTH 2 (TWO) TIMES DAILY.  . metoprolol succinate (TOPROL-XL) 50 MG 24 hr tablet Take 2 tablets (100 mg total) by mouth daily. Take with or immediately following a meal. Take 75 mg in the morning and 50 mg in the evening.  . norgestrel-ethinyl estradiol (LO/OVRAL,CRYSELLE) 0.3-30 MG-MCG tablet Take 1 tablet by mouth daily.  Marland Kitchen omeprazole (PRILOSEC) 20 MG capsule Take 20 mg by mouth daily.  Marland Kitchen triamterene-hydrochlorothiazide (MAXZIDE-25) 37.5-25 MG tablet TAKE 1 TABLET BY MOUTH DAILY.     Allergies:   Niacin and related and Tetracyclines & related   Social History   Socioeconomic History  . Marital status: Married    Spouse name:  Not on file  . Number of children: Not on file  . Years of education: Not on file  . Highest education level: Not on file  Occupational History  . Not on file  Social Needs  . Financial resource strain: Not on file  . Food insecurity:    Worry: Not on file    Inability: Not on file  . Transportation needs:    Medical: Not on file    Non-medical: Not on file  Tobacco Use  . Smoking status: Never Smoker  . Smokeless tobacco: Never Used  Substance and Sexual Activity  . Alcohol use: No  . Drug use: No  . 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 Physical activity:    Days per week: Not on file    Minutes per session: Not on file  . Stress: Not on file  Relationships  . Social connections:    Talks on phone: Not on file    Gets together: Not on file    Attends religious service: Not on file    Active member of club or organization: Not on file    Attends meetings of clubs or organizations: Not on file    Relationship status: Not on file  Other Topics Concern  . Not on file  Social History Narrative  . Not on file     Family History: The patient's family history includes Dementia in her maternal grandmother; Diabetes in her maternal grandmother; Heart disease in her father; Hyperlipidemia in her maternal grandmother, mother, and sister; Hypertension in her father, mother, and sister; Lung cancer in her father; Melanoma in her father.  ROS:   Please see the history of present illness.    All other systems reviewed and are negative.  EKGs/Labs/Other Studies Reviewed:    The following studies were reviewed today: Previous test results were discussed with the patient at length.  Lipids were reviewed   Recent Labs: 11/19/2016: Hemoglobin 13.5; Platelets 278.0; TSH 1.78 04/01/2017: BUN 19; Creatinine, Ser 1.07; Potassium 4.5; Sodium 137 06/03/2017: ALT 21  Recent Lipid Panel    Component Value Date/Time   CHOL 189 06/03/2017 0810   CHOL 185 11/29/2013 0911   TRIG 221 (H) 06/03/2017 0810   TRIG 160 (H) 11/29/2013 0911   HDL 38 (L) 06/03/2017 0810   HDL 37 (L) 11/29/2013 0911   CHOLHDL 5.0 (H) 06/03/2017 0810   CHOLHDL 7 11/19/2016 0828   VLDL 45.2 (H) 11/19/2016 0828   LDLCALC 107 (H) 06/03/2017 0810   LDLCALC 116 (H) 11/29/2013 0911   LDLDIRECT 157.0 11/19/2016 0828    Physical Exam:    VS:  BP 136/82 (BP Location: Left Arm, Patient Position: Sitting, Cuff Size: Normal)   Pulse 100   Ht 5' 3.5" (1.613 m)   Wt 162 lb  12.8 oz (73.8 kg)   SpO2 97%   BMI 28.39 kg/m     Wt Readings from Last 3 Encounters:  08/06/17 162 lb 12.8 oz (73.8 kg)  05/25/17 159 lb 6.4 oz (72.3 kg)  03/16/17 163 lb 6.4 oz (74.1 kg)     GEN: Patient is in no acute distress HEENT: Normal NECK: No JVD; No carotid bruits LYMPHATICS: No lymphadenopathy CARDIAC: Hear sounds regular, 2/6 systolic murmur at the apex. RESPIRATORY:  Clear to auscultation without rales, wheezing or rhonchi  ABDOMEN: Soft, non-tender, non-distended MUSCULOSKELETAL:  No edema; No deformity  SKIN: Warm and dry NEUROLOGIC:  Alert and oriented x 3 PSYCHIATRIC:  Normal affect   Signed, Garwin Brothers, MD  08/06/2017 8:31 AM    [facility]

## 2017-08-06 NOTE — Patient Instructions (Signed)

## 2017-08-24 ENCOUNTER — Other Ambulatory Visit: Payer: Self-pay | Admitting: Family Medicine

## 2017-08-24 MED FILL — TRIAMTERENE-HCTZ 37.5-25 MG: 37.5-25 | 30 days supply | Qty: 30 | Fill #0

## 2017-08-24 MED FILL — LISINOPRIL 10 MG TABLET: 10 | 30 days supply | Qty: 60 | Fill #1

## 2017-09-23 MED FILL — TRIAMTERENE-HCTZ 37.5-25 MG: 37.5-25 | 30 days supply | Qty: 30 | Fill #1

## 2017-09-23 MED FILL — LISINOPRIL 10 MG TABLET: 10 | 30 days supply | Qty: 60 | Fill #2

## 2017-10-06 ENCOUNTER — Encounter: Payer: Self-pay | Admitting: Family Medicine

## 2017-10-06 ENCOUNTER — Encounter: Payer: Self-pay | Admitting: Gastroenterology

## 2017-10-06 ENCOUNTER — Ambulatory Visit (INDEPENDENT_AMBULATORY_CARE_PROVIDER_SITE_OTHER): Payer: BLUE CROSS/BLUE SHIELD | Admitting: Family Medicine

## 2017-10-06 VITALS — BP 102/78 | HR 81 | Temp 98.4°F | Resp 18 | Ht 63.5 in | Wt 159.0 lb

## 2017-10-06 DIAGNOSIS — I10 Essential (primary) hypertension: Secondary | ICD-10-CM | POA: Diagnosis not present

## 2017-10-06 DIAGNOSIS — R739 Hyperglycemia, unspecified: Secondary | ICD-10-CM

## 2017-10-06 DIAGNOSIS — Z23 Encounter for immunization: Secondary | ICD-10-CM

## 2017-10-06 DIAGNOSIS — Z1211 Encounter for screening for malignant neoplasm of colon: Secondary | ICD-10-CM

## 2017-10-06 DIAGNOSIS — M791 Myalgia, unspecified site: Secondary | ICD-10-CM | POA: Diagnosis not present

## 2017-10-06 DIAGNOSIS — Z0000 Encounter for general adult medical examination without abnormal findings: Secondary | ICD-10-CM | POA: Diagnosis not present

## 2017-10-06 DIAGNOSIS — E7800 Pure hypercholesterolemia, unspecified: Secondary | ICD-10-CM | POA: Diagnosis not present

## 2017-10-06 DIAGNOSIS — E663 Overweight: Secondary | ICD-10-CM

## 2017-10-06 LAB — COMPREHENSIVE METABOLIC PANEL
ALT: 19 U/L (ref 0–35)
AST: 21 U/L (ref 0–37)
Albumin: 4.1 g/dL (ref 3.5–5.2)
Alkaline Phosphatase: 50 U/L (ref 39–117)
BUN: 17 mg/dL (ref 6–23)
CO2: 26 mEq/L (ref 19–32)
Calcium: 9.6 mg/dL (ref 8.4–10.5)
Chloride: 102 mEq/L (ref 96–112)
Creatinine, Ser: 0.97 mg/dL (ref 0.40–1.20)
GFR: 64.01 mL/min (ref >60.00)
GLUCOSE: 158 mg/dL — AB (ref 70–99)
POTASSIUM: 4.1 meq/L (ref 3.5–5.1)
Sodium: 137 mEq/L (ref 135–145)
Total Bilirubin: 1 mg/dL (ref 0.2–1.2)
Total Protein: 7.1 g/dL (ref 6.0–8.3)

## 2017-10-06 LAB — LIPID PANEL
CHOL/HDL RATIO: 6
Cholesterol: 213 mg/dL — ABNORMAL HIGH (ref 0–200)
HDL: 36 mg/dL — ABNORMAL LOW (ref >39.00)
NONHDL: 176.75
Triglycerides: 274 mg/dL — ABNORMAL HIGH (ref 0.0–149.0)
VLDL: 54.8 mg/dL — AB (ref 0.0–40.0)

## 2017-10-06 LAB — CBC
HCT: 39.9 % (ref 36.0–46.0)
Hemoglobin: 13.4 g/dL (ref 12.0–15.0)
MCHC: 33.6 g/dL (ref 30.0–36.0)
MCV: 90.6 fl (ref 78.0–100.0)
Platelets: 281 10*3/uL (ref 150.0–400.0)
RBC: 4.4 Mil/uL (ref 3.87–5.11)
RDW: 13.6 % (ref 11.5–15.5)
WBC: 6.7 10*3/uL (ref 4.0–10.5)

## 2017-10-06 LAB — TSH: TSH: 1.39 u[IU]/mL (ref 0.35–4.50)

## 2017-10-06 LAB — MAGNESIUM: MAGNESIUM: 1.9 mg/dL (ref 1.5–2.5)

## 2017-10-06 LAB — LDL CHOLESTEROL, DIRECT: Direct LDL: 145 mg/dL

## 2017-10-06 LAB — HEMOGLOBIN A1C: Hgb A1c MFr Bld: 7.2 % — ABNORMAL HIGH (ref 4.6–6.5)

## 2017-10-06 MED FILL — ELINEST-28 TABLET: 0.3-30 | 63 days supply | Qty: 84 | Fill #2

## 2017-10-06 NOTE — Progress Notes (Signed)
Subjective:  I acted as a Education administrator for Dr. Charlett Blake. Marissa Guzman, [place]  Patient ID: Marissa Guzman, female    DOB: May 12, 1965, 52 y.o.   MRN: [id number]  No chief complaint on file.   HPI  Patient is in today for an annual exam. She is following up on her HTN, hyperglycemia and other medical concerns. She has no acute concerns. No recent febrile illness or acute hospitalizations. Denies CP/palp/SOB/HA/congestion/fevers/GI or GU c/o. Taking meds as prescribed. No recent febrile illness or hospitalizations. She is noting an increase in muscle cramps recently. Is managing activities of daily living. No polyuria or polydipsia. Tries to maintain a heart healthy diet and stay active.   Patient Care Team: [name], MD as PCP - General (Family Medicine) Luellen Pucker, MD as Consulting Physician (Obstetrics and Gynecology)   Past Medical History:  Diagnosis Date  . Acute bronchitis 05/16/2012  . Allergic state 05/16/2012  . Constipation 04/16/2014  . GERD (gastroesophageal reflux disease) 06/12/2013  . Hip pain, bilateral 08/19/2015  . HTN (hypertension)   . Hyperglycemia 08/10/2015  . Hyperlipidemia   . Hypokalemia 11/14/2012  . Overweight 08/14/2016  . Seasonal allergies     Past Surgical History:  Procedure Laterality Date  . CESAREAN SECTION    . HEMORRHOID SURGERY     two    Family History  Problem Relation Age of Onset  . Heart disease Father        Deceased  . Hypertension Father   . Lung cancer Father   . Melanoma Father        Eye Removal  . Hyperlipidemia Mother   . Hypertension Mother   . Hyperlipidemia Maternal Grandmother   . Diabetes Maternal Grandmother   . Dementia Maternal Grandmother   . Hypertension Sister        x2  . Hyperlipidemia Sister        x2    Social History   Socioeconomic History  . Marital status: Married    Spouse name: Not on file  . Number of children: Not on file  . Years of education: Not on file  . Highest education level: Not  on file  Occupational History  . Not on file  Social Needs  . Financial resource strain: Not on file  . Food insecurity:    Worry: Not on file    Inability: Not on file  . Transportation needs:    Medical: Not on file    Non-medical: Not on file  Tobacco Use  . Smoking status: Never Smoker  . Smokeless tobacco: Never Used  Substance and Sexual Activity  . Alcohol use: No  . Drug use: No  . 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 Physical activity:    Days per week: Not on file    Minutes per session: Not on file  . Stress: Not on file  Relationships  . Social connections:    Talks on phone: Not on file    Gets together: Not on file    Attends religious service: Not on file    Active member of club or organization: Not on file    Attends meetings of clubs or organizations: Not on file    Relationship status: Not on file  . Intimate partner violence:    Fear of current or ex partner: Not on file    Emotionally abused:  Not on file    Physically abused: Not on file    Forced sexual activity: Not on file  Other Topics Concern  . Not on file  Social History Narrative  . Not on file    Outpatient Medications Prior to Visit  Medication Sig Dispense Refill  . atorvastatin (LIPITOR) 80 MG tablet TAKE 1 TABLET (80 MG TOTAL) BY MOUTH DAILY. 90 tablet 1  . cetirizine (ZYRTEC) 10 MG tablet Take 10 mg by mouth daily as needed.    . ezetimibe (ZETIA) 10 MG tablet Take 1 tablet (10 mg total) by mouth daily. 90 tablet 2  . fluticasone (FLONASE) 50 MCG/ACT nasal spray Place 2 sprays into the nose daily as needed.    Marland Kitchen lisinopril (PRINIVIL,ZESTRIL) 10 MG tablet TAKE 1 TABLET (10 MG TOTAL) BY MOUTH 2 (TWO) TIMES DAILY. 60 tablet 3  . metoprolol succinate (TOPROL-XL) 50 MG 24 hr tablet Take 2 tablets (100 mg total) by mouth daily. Take with or immediately following a meal. Take 75  mg in the morning and 50 mg in the evening. 180 tablet 2  . norgestrel-ethinyl estradiol (LO/OVRAL,CRYSELLE) 0.3-30 MG-MCG tablet Take 1 tablet by mouth daily. 1 Package 0  . omeprazole (PRILOSEC) 20 MG capsule Take 20 mg by mouth daily.    Marland Kitchen triamterene-hydrochlorothiazide (MAXZIDE-25) 37.5-25 MG tablet TAKE 1 TABLET BY MOUTH DAILY. 30 tablet 1  . nitroGLYCERIN (NITROSTAT) 0.4 MG SL tablet Place 1 tablet (0.4 mg total) under the tongue every 5 (five) minutes as needed for chest pain. 90 tablet 3   No facility-administered medications prior to visit.     Allergies  Allergen Reactions  . Niacin And Related     Extreme flushing despite taking it correctly  . Tetracyclines & Related Other (See Comments)    GI Issues.    Review of Systems  Constitutional: Negative for fever and malaise/fatigue.  HENT: Negative for congestion.   Eyes: Negative for blurred vision.  Respiratory: Negative for cough and shortness of breath.   Cardiovascular: Negative for chest pain, palpitations and leg swelling.  Gastrointestinal: Negative for vomiting.  Musculoskeletal: Negative for back pain.  Skin: Negative for rash.  Neurological: Negative for loss of consciousness and headaches.       Objective:    Physical Exam  Constitutional: She is oriented to person, place, and time. She appears well-developed and well-nourished. No distress.  HENT:  Head: Normocephalic and atraumatic.  Eyes: Conjunctivae are normal.  Neck: Normal range of motion. No thyromegaly present.  Cardiovascular: Normal rate and regular rhythm.  Pulmonary/Chest: Effort normal and breath sounds normal. She has no wheezes.  Abdominal: Soft. Bowel sounds are normal. There is no tenderness.  Musculoskeletal: Normal range of motion. She exhibits no edema or deformity.  Neurological: She is alert and oriented to person, place, and time.  Skin: Skin is warm and dry. She is not diaphoretic.  Psychiatric: She has a normal mood and affect.     BP 102/78 (BP Location: Left Arm, Patient Position: Sitting, Cuff Size: Normal)   Pulse 81   Temp 98.4 F (36.9 C) (Oral)   Resp 18   Ht 5' 3.5" (1.613 m)   Wt 159 lb (72.1 kg)   SpO2 98%   BMI 27.72 kg/m  Wt Readings from Last 3 Encounters:  10/06/17 159 lb (72.1 kg)  08/06/17 162 lb 12.8 oz (73.8 kg)  05/25/17 159 lb 6.4 oz (72.3 kg)   BP Readings from Last 3 Encounters:  10/06/17 102/78  08/06/17 136/82  05/25/17 136/83     Immunization History  Administered Date(s) Administered  . Tdap 02/01/2013    Health Maintenance  Topic Date Due  . HIV Screening  06/05/1980  . COLONOSCOPY  06/06/2015  . MAMMOGRAM  03/18/2017  . INFLUENZA VACCINE  11/16/2017 (Originally 10/15/2017)  . PAP SMEAR  04/18/2019  . TETANUS/TDAP  02/02/2023    Lab Results  Component Value Date   WBC 6.7 10/06/2017   HGB 13.4 10/06/2017   HCT 39.9 10/06/2017   PLT 281.0 10/06/2017   GLUCOSE 158 (H) 10/06/2017   CHOL 213 (H) 10/06/2017   TRIG 274.0 (H) 10/06/2017   HDL 36.00 (L) 10/06/2017   LDLDIRECT 145.0 10/06/2017   LDLCALC 107 (H) 06/03/2017   ALT 19 10/06/2017   AST 21 10/06/2017   NA 137 10/06/2017   K 4.1 10/06/2017   CL 102 10/06/2017   CREATININE 0.97 10/06/2017   BUN 17 10/06/2017   CO2 26 10/06/2017   TSH 1.39 10/06/2017   HGBA1C 7.2 (H) 10/06/2017    Lab Results  Component Value Date   TSH 1.39 10/06/2017   Lab Results  Component Value Date   WBC 6.7 10/06/2017   HGB 13.4 10/06/2017   HCT 39.9 10/06/2017   MCV 90.6 10/06/2017   PLT 281.0 10/06/2017   Lab Results  Component Value Date   NA 137 10/06/2017   K 4.1 10/06/2017   CO2 26 10/06/2017   GLUCOSE 158 (H) 10/06/2017   BUN 17 10/06/2017   CREATININE 0.97 10/06/2017   BILITOT 1.0 10/06/2017   ALKPHOS 50 10/06/2017   AST 21 10/06/2017   ALT 19 10/06/2017   PROT 7.1 10/06/2017   ALBUMIN 4.1 10/06/2017   CALCIUM 9.6 10/06/2017   GFR 64.01 10/06/2017   Lab Results  Component Value Date   CHOL 213  (H) 10/06/2017   Lab Results  Component Value Date   HDL 36.00 (L) 10/06/2017   Lab Results  Component Value Date   LDLCALC 107 (H) 06/03/2017   Lab Results  Component Value Date   TRIG 274.0 (H) 10/06/2017   Lab Results  Component Value Date   CHOLHDL 6 10/06/2017   Lab Results  Component Value Date   HGBA1C 7.2 (H) 10/06/2017         Assessment & Plan:   Problem List Items Addressed This Visit    Hypertension, benign essential, goal below 140/90    Well controlled, no changes to meds. Encouraged heart healthy diet such as the DASH diet and exercise as tolerated.       Relevant Medications   rosuvastatin (CRESTOR) 40 MG tablet   Other Relevant Orders   CBC (Completed)   Comprehensive metabolic panel (Completed)   TSH (Completed)   Hypercholesterolemia    Encouraged heart healthy diet, increase exercise, avoid trans fats, consider a krill oil cap daily      Relevant Medications   rosuvastatin (CRESTOR) 40 MG tablet   Other Relevant Orders   Lipid panel (Completed)   Preventative health care    Patient encouraged to maintain heart healthy diet, regular exercise, adequate sleep. Consider daily probiotics. Take medications as prescribed. Given and reviewed copy of ACP documents from [employer] and encouraged to complete and return. Labs ordered and reviewed. Checked measles immunity and she is positive for immunity.       Hyperglycemia    hgba1c acceptable, minimize simple carbs. Increase exercise as tolerated.       Relevant  Orders   Hemoglobin A1c (Completed)   Overweight    Encouraged DASH diet, decrease po intake and increase exercise as tolerated. Needs 7-8 hours of sleep nightly. Avoid trans fats, eat small, frequent meals every 4-5 hours with lean proteins, complex carbs and healthy fats.       Myalgia    Check magnesium and hydrate better. Notify us if worsens.      Relevant Orders   Magnesium (Completed)    Other Visit Diagnoses     Need for vaccination for measles    -  Primary   Relevant Orders   Measles ([place]) Antibody IgG (Completed)   Colon cancer screening       Relevant Orders   Ambulatory referral to Gastroenterology      I have discontinued Marissa Guzman's nitroGLYCERIN. I am also having her start on metFORMIN, rosuvastatin, and blood glucose meter kit and supplies. Additionally, I am having her maintain her fluticasone, cetirizine, norgestrel-ethinyl estradiol, omeprazole, metoprolol succinate, ezetimibe, atorvastatin, lisinopril, and triamterene-hydrochlorothiazide.  Meds ordered this encounter  Medications  . metFORMIN (GLUCOPHAGE) 500 MG tablet    Sig: Take 1 tablet (500 mg total) by mouth 2 (two) times daily with a meal.    Dispense:  180 tablet    Refill:  1  . rosuvastatin (CRESTOR) 40 MG tablet    Sig: Take 1 tablet (40 mg total) by mouth daily.    Dispense:  90 tablet    Refill:  1  . blood glucose meter kit and supplies KIT    Sig: Dispense based on patient and insurance preference. Check BS  daily as directed.  DzE11.9    Dispense:  1 each    Refill:  0    Order Specific Question:   Number of strips    Answer:   100    Order Specific Question:   Number of lancets    Answer:   100   CMA served as scribe during this visit. History, Physical and Plan performed by medical provider. Documentation and orders reviewed and attested to.   Penni Homans, MD

## 2017-10-06 NOTE — Patient Instructions (Addendum)
Shingrix is the new shingles shot, 2 shots over 2-6 months call insurance and confirm payment then call for nurse visit if interested  Hyland's leg cramp medicine at pharmacy hydrate with 65-75 oz per day. Extra if any caffeine or alcohol  Preventive Care 40-64 Years, Female Preventive care refers to lifestyle choices and visits with your health care provider that can promote health and wellness. What does preventive care include?  A yearly physical exam. This is also called an annual well check.  Dental exams once or twice a year.  Routine eye exams. Ask your health care provider how often you should have your eyes checked.  Personal lifestyle choices, including: ? Daily care of your teeth and gums. ? Regular physical activity. ? Eating a healthy diet. ? Avoiding tobacco and drug use. ? Limiting alcohol use. ? Practicing safe sex. ? Taking low-dose aspirin daily starting at age 67. ? Taking vitamin and mineral supplements as recommended by your health care provider. What happens during an annual well check? The services and screenings done by your health care provider during your annual well check will depend on your age, overall health, lifestyle risk factors, and family history of disease. Counseling Your health care provider may ask you questions about your:  Alcohol use.  Tobacco use.  Drug use.  Emotional well-being.  Home and relationship well-being.  Sexual activity.  Eating habits.  Work and work Statistician.  Method of birth control.  Menstrual cycle.  Pregnancy history.  Screening You may have the following tests or measurements:  Height, weight, and BMI.  Blood pressure.  Lipid and cholesterol levels. These may be checked every 5 years, or more frequently if you are over 69 years old.  Skin check.  Lung cancer screening. You may have this screening every year starting at age 69 if you have a 30-pack-year history of smoking and currently smoke  or have quit within the past 15 years.  Fecal occult blood test (FOBT) of the stool. You may have this test every year starting at age 29.  Flexible sigmoidoscopy or colonoscopy. You may have a sigmoidoscopy every 5 years or a colonoscopy every 10 years starting at age 42.  Hepatitis C blood test.  Hepatitis B blood test.  Sexually transmitted disease (STD) testing.  Diabetes screening. This is done by checking your blood sugar (glucose) after you have not eaten for a while (fasting). You may have this done every 1-3 years.  Mammogram. This may be done every 1-2 years. Talk to your health care provider about when you should start having regular mammograms. This may depend on whether you have a family history of breast cancer.  BRCA-related cancer screening. This may be done if you have a family history of breast, ovarian, tubal, or peritoneal cancers.  Pelvic exam and Pap test. This may be done every 3 years starting at age 39. Starting at age 84, this may be done every 5 years if you have a Pap test in combination with an HPV test.  Bone density scan. This is done to screen for osteoporosis. You may have this scan if you are at high risk for osteoporosis.  Discuss your test results, treatment options, and if necessary, the need for more tests with your health care provider. Vaccines Your health care provider may recommend certain vaccines, such as:  Influenza vaccine. This is recommended every year.  Tetanus, diphtheria, and acellular pertussis (Tdap, Td) vaccine. You may need a Td booster every 10 years.  Varicella vaccine. You may need this if you have not been vaccinated.  Zoster vaccine. You may need this after age 28.  Measles, mumps, and rubella (MMR) vaccine. You may need at least one dose of MMR if you were born in 1957 or later. You may also need a second dose.  Pneumococcal 13-valent conjugate (PCV13) vaccine. You may need this if you have certain conditions and were  not previously vaccinated.  Pneumococcal polysaccharide (PPSV23) vaccine. You may need one or two doses if you smoke cigarettes or if you have certain conditions.  Meningococcal vaccine. You may need this if you have certain conditions.  Hepatitis A vaccine. You may need this if you have certain conditions or if you travel or work in places where you may be exposed to hepatitis A.  Hepatitis B vaccine. You may need this if you have certain conditions or if you travel or work in places where you may be exposed to hepatitis B.  Haemophilus influenzae type b (Hib) vaccine. You may need this if you have certain conditions.  Talk to your health care provider about which screenings and vaccines you need and how often you need them. This information is not intended to replace advice given to you by your health care provider. Make sure you discuss any questions you have with your health care provider. Document Released: 03/30/2015 Document Revised: 11/21/2015 Document Reviewed: 01/02/2015 Elsevier Interactive Patient Education  [employer].

## 2017-10-06 NOTE — Assessment & Plan Note (Addendum)
Patient encouraged to maintain heart healthy diet, regular exercise, adequate sleep. Consider daily probiotics. Take medications as prescribed. Given and reviewed copy of ACP documents from [employer] and encouraged to complete and return. Labs ordered and reviewed. Checked measles immunity and she is positive for immunity.

## 2017-10-06 NOTE — Assessment & Plan Note (Signed)
Well controlled, no changes to meds. Encouraged heart healthy diet such as the DASH diet and exercise as tolerated.  °

## 2017-10-06 NOTE — Assessment & Plan Note (Signed)
Encouraged heart healthy diet, increase exercise, avoid trans fats, consider a krill oil cap daily 

## 2017-10-06 NOTE — Assessment & Plan Note (Signed)
Encouraged DASH diet, decrease po intake and increase exercise as tolerated. Needs 7-8 hours of sleep nightly. Avoid trans fats, eat small, frequent meals every 4-5 hours with lean proteins, complex carbs and healthy fats 

## 2017-10-06 NOTE — Assessment & Plan Note (Signed)
hgba1c acceptable, minimize simple carbs. Increase exercise as tolerated.  

## 2017-10-07 LAB — RUBEOLA ANTIBODY IGG: RUBEOLA IGG: 74.3 [AU]/ml

## 2017-10-07 MED ORDER — ROSUVASTATIN CALCIUM 40 MG PO TABS: 40.0000 mg | ORAL_TABLET | Freq: Every day | ORAL | 1 refills | Status: DC

## 2017-10-07 MED ORDER — METFORMIN HCL 500 MG PO TABS: 500.0000 mg | ORAL_TABLET | Freq: Two times a day (BID) | ORAL | 1 refills | Status: DC

## 2017-10-07 MED ORDER — BLOOD GLUCOSE MONITOR KIT: PACK | 0 refills | Status: AC

## 2017-10-07 MED FILL — ROSUVASTATIN CALCIUM 40 MG: 40 | 90 days supply | Qty: 90 | Fill #0

## 2017-10-07 MED FILL — ONE TOUCH ULTRA TEST STRIPS: 25 days supply | Qty: 25 | Fill #0

## 2017-10-07 MED FILL — ONE TOUCH DELICA 33G LANCET: 90 days supply | Qty: 100 | Fill #0

## 2017-10-07 MED FILL — metFORMIN HCL 500 MG TABS: 500 | 90 days supply | Qty: 180 | Fill #0

## 2017-10-07 MED FILL — ONE TOUCH ULTRAMINI METER: W/DEVICE | 30 days supply | Qty: 1 | Fill #0

## 2017-10-11 DIAGNOSIS — M791 Myalgia, unspecified site: Secondary | ICD-10-CM | POA: Insufficient documentation

## 2017-10-11 NOTE — Assessment & Plan Note (Signed)
Check magnesium and hydrate better. Notify us if worsens.

## 2017-10-12 ENCOUNTER — Encounter: Payer: Self-pay | Admitting: Family Medicine

## 2017-10-16 ENCOUNTER — Encounter: Payer: Self-pay | Admitting: Family Medicine

## 2017-10-22 ENCOUNTER — Other Ambulatory Visit: Payer: Self-pay | Admitting: Cardiology

## 2017-10-22 ENCOUNTER — Other Ambulatory Visit: Payer: Self-pay | Admitting: Family Medicine

## 2017-10-22 DIAGNOSIS — I10 Essential (primary) hypertension: Secondary | ICD-10-CM

## 2017-10-22 MED FILL — LISINOPRIL 10 MG TABLET: 10 | 30 days supply | Qty: 60 | Fill #3

## 2017-10-22 MED FILL — METOPROLOL SUCCINATE ER 50: 50 | 72 days supply | Qty: 180 | Fill #0

## 2017-10-22 MED FILL — TRIAMTERENE-HCTZ 37.5-25 MG: 37.5-25 | 30 days supply | Qty: 30 | Fill #0

## 2017-10-27 ENCOUNTER — Other Ambulatory Visit: Payer: Self-pay | Admitting: Family Medicine

## 2017-10-27 ENCOUNTER — Encounter: Payer: Self-pay | Admitting: Family Medicine

## 2017-10-27 DIAGNOSIS — E119 Type 2 diabetes mellitus without complications: Secondary | ICD-10-CM

## 2017-11-03 ENCOUNTER — Encounter: Payer: BLUE CROSS/BLUE SHIELD | Attending: Family Medicine | Admitting: *Deleted

## 2017-11-03 DIAGNOSIS — Z713 Dietary counseling and surveillance: Secondary | ICD-10-CM | POA: Insufficient documentation

## 2017-11-03 DIAGNOSIS — E119 Type 2 diabetes mellitus without complications: Secondary | ICD-10-CM | POA: Insufficient documentation

## 2017-11-03 NOTE — Patient Instructions (Signed)
Plan:  Aim for 3 Carb Choices per meal (45 grams) +/- 1 either way  Aim for 0-2 Carbs per snack if hungry  Include protein in moderation with your meals and snacks Consider reading food labels for Total Carbohydrate of foods Continue with your activity level by walking for 45 minutes every other day as tolerated Consider checking BG at alternate times per day as directed by MD  Continue taking medication as directed by MD

## 2017-11-05 MED FILL — ONE TOUCH ULTRA TEST STRIPS: 25 days supply | Qty: 25 | Fill #1

## 2017-11-05 NOTE — Progress Notes (Signed)
Diabetes Self-Management Education  Visit Type: First/Initial  Appt. Start Time: 1530 Appt. End Time: 1700  11/05/2017  Marissa Guzman, identified by name and date of birth, is a 52 y.o. female with a diagnosis of Diabetes: Type 2. She is here with her husband who participated in the visit. 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 Patient brought her food records that indicate an over restriction of carb containing foods, but good variety of all food groups. She is fairly active walking or going to the gym and meeting with personal trainer almost every weekday. She has a meter and is checking pre breakfast and post supper.  ASSESSMENT  There were no vitals taken for this visit. There is no height or weight on file to calculate BMI.  Diabetes Self-Management Education - 11/05/17 1413      Visit Information   Visit Type  First/Initial      Initial Visit   Diabetes Type  Type 2    Are you currently following a meal plan?  No    Are you taking your medications as prescribed?  Yes    Date Diagnosed  10/2017      Health Coping   How would you rate your overall health?  Fair      Psychosocial Assessment   Patient Belief/Attitude about Diabetes  Afraid    Self-care barriers  None    Self-management support  Family    Other persons present  Patient;Spouse/SO    Patient Concerns  Nutrition/Meal planning;Glycemic Control    Special Needs  None    Learning Readiness  Change in progress    How often do you need to have someone help you when you read instructions, pamphlets, or other written materials from your doctor or pharmacy?  1 - Never    What is the last grade level you completed in school?  12      Pre-Education Assessment   Patient understands the diabetes disease and treatment process.  Needs Instruction    Patient understands incorporating nutritional management into lifestyle.  Needs Instruction    Patient undertands incorporating physical activity into  lifestyle.  Needs Instruction    Patient understands using medications safely.  Needs Instruction    Patient understands monitoring blood glucose, interpreting and using results  Needs Instruction    Patient understands prevention, detection, and treatment of acute complications.  Needs Instruction    Patient understands prevention, detection, and treatment of chronic complications.  Needs Instruction    Patient understands how to develop strategies to address psychosocial issues.  Needs Instruction    Patient understands how to develop strategies to promote health/change behavior.  Needs Instruction      Complications   Last HgB A1C per patient/outside source  7.2 %    How often do you check your blood sugar?  1-2 times/day    Fasting Blood glucose range (mg/dL)  35-573    Postprandial Blood glucose range (mg/dL)  220-254;27-062    Number of hypoglycemic episodes per month  0    Have you had a dilated eye exam in the past 12 months?  Yes    Have you had a dental exam in the past 12 months?  Yes    Are you checking your feet?  Yes    How many days per week are you checking your feet?  7      Dietary Intake   Breakfast  used to eat a bagel, switched to biscuit with  sausage or bacon and egg    Beverage(s)  water, diet soda      Exercise   Exercise Type  Light (walking / raking leaves)    How many days per week to you exercise?  5    How many minutes per day do you exercise?  45    Total minutes per week of exercise  225      Patient Education   Previous Diabetes Education  No    Disease state   Definition of diabetes, type 1 and 2, and the diagnosis of diabetes;Factors that contribute to the development of diabetes    Nutrition management   Role of diet in the treatment of diabetes and the relationship between the three main macronutrients and blood glucose level;Carbohydrate counting;Food label reading, portion sizes and measuring food.;Reviewed blood glucose goals for pre and post  meals and how to evaluate the patients' food intake on their blood glucose level.    Physical activity and exercise   Helped patient identify appropriate exercises in relation to his/her diabetes, diabetes complications and other health issue.;Role of exercise on diabetes management, blood pressure control and cardiac health.    Medications  Reviewed patients medication for diabetes, action, purpose, timing of dose and side effects.    Monitoring  Purpose and frequency of SMBG.;Identified appropriate SMBG and/or A1C goals.    Chronic complications  Relationship between chronic complications and blood glucose control    Psychosocial adjustment  Role of stress on diabetes      Individualized Goals (developed by patient)   Nutrition  Follow meal plan discussed    Physical Activity  Exercise 3-5 times per week    Medications  take my medication as prescribed    Monitoring   test blood glucose pre and post meals as discussed      Post-Education Assessment   Patient understands the diabetes disease and treatment process.  Demonstrates understanding / competency    Patient understands incorporating nutritional management into lifestyle.  Demonstrates understanding / competency    Patient undertands incorporating physical activity into lifestyle.  Demonstrates understanding / competency    Patient understands using medications safely.  Demonstrates understanding / competency    Patient understands monitoring blood glucose, interpreting and using results  Demonstrates understanding / competency    Patient understands prevention, detection, and treatment of acute complications.  Demonstrates understanding / competency    Patient understands prevention, detection, and treatment of chronic complications.  Demonstrates understanding / competency    Patient understands how to develop strategies to address psychosocial issues.  Demonstrates understanding / competency    Patient understands how to develop  strategies to promote health/change behavior.  Demonstrates understanding / competency      Outcomes   Expected Outcomes  Demonstrated interest in learning. Expect positive outcomes    Future DMSE  PRN    Program Status  Completed       Individualized Plan for Diabetes Self-Management Training:   Learning Objective:  Patient will have a greater understanding of diabetes self-management. Patient education plan is to attend individual and/or group sessions per assessed needs and concerns.   Plan:   Patient Instructions  Plan:  Aim for 3 Carb Choices per meal (45 grams) +/- 1 either way  Aim for 0-2 Carbs per snack if hungry  Include protein in moderation with your meals and snacks Consider reading food labels for Total Carbohydrate of foods Continue with your activity level by walking for 45 minutes every other  day as tolerated Consider checking BG at alternate times per day as directed by MD  Continue taking medication as directed by MD  Expected Outcomes:  Demonstrated interest in learning. Expect positive outcomes  Education material provided: ADA Diabetes: Your Take Control Guide, Food label handouts, A1C conversion sheet, Meal plan card and Carbohydrate counting sheet  If problems or questions, patient to contact team via:  Phone  Future DSME appointment: PRN

## 2017-11-23 ENCOUNTER — Other Ambulatory Visit: Payer: Self-pay | Admitting: Family Medicine

## 2017-11-23 MED FILL — EZETIMIBE 10 MG TABLET: 10 | 90 days supply | Qty: 90 | Fill #2

## 2017-11-23 MED FILL — TRIAMTERENE-HCTZ 37.5-25 MG: 37.5-25 | 30 days supply | Qty: 30 | Fill #1

## 2017-11-24 ENCOUNTER — Encounter: Payer: Self-pay | Admitting: Gastroenterology

## 2017-11-24 ENCOUNTER — Ambulatory Visit: Payer: BLUE CROSS/BLUE SHIELD | Admitting: *Deleted

## 2017-11-24 VITALS — Ht 63.5 in | Wt 148.0 lb

## 2017-11-24 DIAGNOSIS — Z1211 Encounter for screening for malignant neoplasm of colon: Secondary | ICD-10-CM

## 2017-11-24 MED ORDER — NA SULFATE-K SULFATE-MG SULF 17.5-3.13-1.6 GM/177ML PO SOLN: 1.0000 | Freq: Once | ORAL | 0 refills | Status: AC

## 2017-11-24 MED FILL — SUPREP BOWEL PREP KIT: 17.5-3.13-1 | 1 days supply | Qty: 354 | Fill #0

## 2017-11-24 NOTE — Progress Notes (Signed)
No egg or soy allergy known to patient  No issues with past sedation with any surgeries  or procedures, no intubation problems  No diet pills per patient No home 02 use per patient  No blood thinners per patient  Pt denies issues with constipation currently- past hx of issues  No A fib or A flutter  EMMI video sent to pt's e mail - pt declined  Pt states her HR is normally high usually in the 80-90's and her BP runs high as well even on meds - she wants to know this ahead of the colon  $15 Suprep coupon to pt in Pv today

## 2017-11-25 MED FILL — LISINOPRIL 10 MG TABLET: 10 | 30 days supply | Qty: 60 | Fill #0

## 2017-12-07 ENCOUNTER — Ambulatory Visit: Payer: Self-pay

## 2017-12-07 ENCOUNTER — Encounter: Payer: Self-pay | Admitting: Family Medicine

## 2017-12-07 ENCOUNTER — Ambulatory Visit: Payer: BLUE CROSS/BLUE SHIELD | Admitting: Family Medicine

## 2017-12-07 VITALS — BP 108/70 | HR 85 | Temp 98.7°F | Resp 16 | Ht 63.5 in | Wt 141.6 lb

## 2017-12-07 DIAGNOSIS — R42 Dizziness and giddiness: Secondary | ICD-10-CM | POA: Diagnosis not present

## 2017-12-07 DIAGNOSIS — I10 Essential (primary) hypertension: Secondary | ICD-10-CM | POA: Diagnosis not present

## 2017-12-07 NOTE — Patient Instructions (Signed)
Dizziness °Dizziness is a common problem. It is a feeling of unsteadiness or light-headedness. You may feel like you are about to faint. Dizziness can lead to injury if you stumble or fall. Anyone can become dizzy, but dizziness is more common in older adults. This condition can be caused by a number of things, including medicines, dehydration, or illness. °Follow these instructions at home: °Eating and drinking °· Drink enough fluid to keep your urine clear or pale yellow. This helps to keep you from becoming dehydrated. Try to drink more clear fluids, such as water. °· Do not drink alcohol. °· Limit your caffeine intake if told to do so by your health care provider. Check ingredients and nutrition facts to see if a food or beverage contains caffeine. °· Limit your salt (sodium) intake if told to do so by your health care provider. Check ingredients and nutrition facts to see if a food or beverage contains sodium. °Activity °· Avoid making quick movements. °? Rise slowly from chairs and steady yourself until you feel okay. °? In the morning, first sit up on the side of the bed. When you feel okay, stand slowly while you hold onto something until you know that your balance is fine. °· If you need to stand in one place for a long time, move your legs often. Tighten and relax the muscles in your legs while you are standing. °· Do not drive or use heavy machinery if you feel dizzy. °· Avoid bending down if you feel dizzy. Place items in your home so that they are easy for you to reach without leaning over. °Lifestyle °· Do not use any products that contain nicotine or tobacco, such as cigarettes and e-cigarettes. If you need help quitting, ask your health care provider. °· Try to reduce your stress level by using methods such as yoga or meditation. Talk with your health care provider if you need help to manage your stress. °General instructions °· Watch your dizziness for any changes. °· Take over-the-counter and  prescription medicines only as told by your health care provider. Talk with your health care provider if you think that your dizziness is caused by a medicine that you are taking. °· Tell a friend or a family member that you are feeling dizzy. If he or she notices any changes in your behavior, have this person call your health care provider. °· Keep all follow-up visits as told by your health care provider. This is important. °Contact a health care provider if: °· Your dizziness does not go away. °· Your dizziness or light-headedness gets worse. °· You feel nauseous. °· You have reduced hearing. °· You have new symptoms. °· You are unsteady on your feet or you feel like the room is spinning. °Get help right away if: °· You vomit or have diarrhea and are unable to eat or drink anything. °· You have problems talking, walking, swallowing, or using your arms, hands, or legs. °· You feel generally weak. °· You are not thinking clearly or you have trouble forming sentences. It may take a friend or family member to notice this. °· You have chest pain, abdominal pain, shortness of breath, or sweating. °· Your vision changes. °· You have any bleeding. °· You have a severe headache. °· You have neck pain or a stiff neck. °· You have a fever. °These symptoms may represent a serious problem that is an emergency. Do not wait to see if the symptoms will go away. Get medical help   right away. Call your local emergency services (911 in the [place] Do not drive yourself to the hospital. °Summary °· Dizziness is a feeling of unsteadiness or light-headedness. This condition can be caused by a number of things, including medicines, dehydration, or illness. °· Anyone can become dizzy, but dizziness is more common in older adults. °· Drink enough fluid to keep your urine clear or pale yellow. Do not drink alcohol. °· Avoid making quick movements if you feel dizzy. Monitor your dizziness for any changes. °This information is not intended to  replace advice given to you by your health care provider. Make sure you discuss any questions you have with your health care provider. °Document Released: 08/27/2000 Document Revised: 04/05/2016 Document Reviewed: 04/05/2016 °Elsevier Interactive Patient Education © 2018 [employer] ° °

## 2017-12-07 NOTE — Telephone Encounter (Signed)
Pt called after feeling dizzy and checking her BP and finding it low ,90/64 this am. Her HR 88. Pt states she has recently lost weight and is wondering if her medications my need reduced. Pt is concerned about a colonoscopy scheduled tomorrow and her BP this low. BP this weekend 113/77, 99/69, and 95/67.  Pt gives HR as 69 and 77. Appointment made today per protocol. Care advice given. Pt verbalized understanding of all instructions. Reason for Disposition . [1] Systolic BP 90-110 AND [2] taking blood pressure medications AND [3] dizzy, lightheaded or weak  Answer Assessment - Initial Assessment Questions 1. BLOOD PRESSURE: "What is the blood pressure?" "Did you take at least two measurements 5 minutes apart?"     90/64  2. ONSET: "When did you take your blood pressure?"     This AM 3. HOW: "How did you obtain the blood pressure?" (e.g., visiting nurse, automatic home BP monitor)     Home monitor 4. HISTORY: "Do you have a history of low blood pressure?" "What is your blood pressure normally?"     Normal is high but  lost weight now low 5. MEDICATIONS: "Are you taking any medications for blood pressure?" If yes: "Have they been changed recently?"     Lisinopril Maxide Metoprolol 6. PULSE RATE: "Do you know what your pulse rate is?"      88 7. OTHER SYMPTOMS: "Have you been sick recently?" "Have you had a recent injury?"     No but feeling  dizzy 8. PREGNANCY: "Is there any chance you are pregnant?" "When was your last menstrual period?"     no  Protocols used: LOW BLOOD PRESSURE-A-AH

## 2017-12-07 NOTE — Assessment & Plan Note (Addendum)
?   Caused by low bp Stop  maxzide  F/u 2-3 weeks

## 2017-12-07 NOTE — Progress Notes (Signed)
Patient ID: Marissa Guzman, female    DOB: [birth year]  Age: 52 y.o. MRN: [id number]    Subjective:  Subjective  HPI STEPHANNE GREELEY presents for dizziness and low bp  She has lost over 20 lbs since her last ov and noticed  Review of Systems  Constitutional: Positive for fatigue. Negative for activity change, appetite change and unexpected weight change.  Respiratory: Negative for cough and shortness of breath.   Cardiovascular: Negative for chest pain and palpitations.  Neurological: Positive for dizziness. Negative for tremors, weakness and numbness.  Psychiatric/Behavioral: Negative for behavioral problems and dysphoric mood. The patient is not nervous/anxious.       History Past Medical History:  Diagnosis Date  . Acute bronchitis 05/16/2012  . Allergic state 05/16/2012  . Allergy   . Constipation 04/16/2014  . Diabetes mellitus without complication ([place])   . GERD (gastroesophageal reflux disease) 06/12/2013  . Hip pain, bilateral 08/19/2015  . HTN (hypertension)   . Hyperglycemia 08/10/2015  . Hyperlipidemia   . Hypokalemia 11/14/2012  . Overweight 08/14/2016  . Seasonal allergies     She has a past surgical history that includes Hemorrhoid surgery and Cesarean section.   Her family history includes Colon polyps in her mother, sister, and sister; Dementia in her maternal grandmother; Diabetes in her maternal grandmother; Heart disease in her father; Hyperlipidemia in her maternal grandmother, mother, and sister; Hypertension in her father, mother, and sister; Lung cancer in her father; Melanoma in her father.She reports that she has never smoked. She has never used smokeless tobacco. She reports that she does not drink alcohol or use drugs.  Current Outpatient Medications on File Prior to Visit  Medication Sig Dispense Refill  . blood glucose meter kit and supplies KIT Dispense based on patient and insurance preference. Check BS  daily as directed.  DzE11.9 1 each 0  . cetirizine  (ZYRTEC) 10 MG tablet Take 10 mg by mouth daily as needed.    . ezetimibe (ZETIA) 10 MG tablet Take 1 tablet (10 mg total) by mouth daily. 90 tablet 2  . fluticasone (FLONASE) 50 MCG/ACT nasal spray Place 2 sprays into the nose daily as needed.    Marland Kitchen lisinopril (PRINIVIL,ZESTRIL) 10 MG tablet TAKE 1 TABLET (10 MG TOTAL) BY MOUTH 2 (TWO) TIMES DAILY. 60 tablet 3  . metFORMIN (GLUCOPHAGE) 500 MG tablet Take 1 tablet (500 mg total) by mouth 2 (two) times daily with a meal. 180 tablet 1  . metoprolol succinate (TOPROL-XL) 50 MG 24 hr tablet TAKE WITH OR IMMEDIATELY FOLLOWING A MEAL. TAKE 1&1/2 TABLETS BY MOUTH IN THE MORNING AND 1 TABLET IN THE EVENING. 180 tablet 2  . norgestrel-ethinyl estradiol (LO/OVRAL,CRYSELLE) 0.3-30 MG-MCG tablet Take 1 tablet by mouth daily. 1 Package 0  . omeprazole (PRILOSEC) 20 MG capsule Take 20 mg by mouth daily.    . ONE TOUCH ULTRA TEST test strip   0  . ONETOUCH DELICA LANCETS 45O MISC   0  . rosuvastatin (CRESTOR) 40 MG tablet Take 1 tablet (40 mg total) by mouth daily. 90 tablet 1   No current facility-administered medications on file prior to visit.      Objective:  Objective  Physical Exam  Constitutional: She is oriented to person, place, and time. She appears well-developed and well-nourished.  HENT:  Head: Normocephalic and atraumatic.  Eyes: Conjunctivae and EOM are normal.  Neck: Normal range of motion. Neck supple. No JVD present. Carotid bruit is not present. No thyromegaly present.  Cardiovascular: Normal rate, regular rhythm and normal heart sounds.  No murmur heard. Pulmonary/Chest: Effort normal and breath sounds normal. No respiratory distress. She has no wheezes. She has no rales. She exhibits no tenderness.  Musculoskeletal: She exhibits no edema.  Neurological: She is alert and oriented to person, place, and time.  Psychiatric: She has a normal mood and affect.  Nursing note and vitals reviewed.  BP 108/70 (BP Location: Right Arm, Cuff  Size: Normal)   Pulse 85   Temp 98.7 F (37.1 C) (Oral)   Resp 16   Ht 5' 3.5" (1.613 m)   Wt 141 lb 9.6 oz (64.2 kg)   SpO2 96%   BMI 24.69 kg/m  Wt Readings from Last 3 Encounters:  12/07/17 141 lb 9.6 oz (64.2 kg)  11/24/17 148 lb (67.1 kg)  10/06/17 159 lb (72.1 kg)     Lab Results  Component Value Date   WBC 6.7 10/06/2017   HGB 13.4 10/06/2017   HCT 39.9 10/06/2017   PLT 281.0 10/06/2017   GLUCOSE 158 (H) 10/06/2017   CHOL 213 (H) 10/06/2017   TRIG 274.0 (H) 10/06/2017   HDL 36.00 (L) 10/06/2017   LDLDIRECT 145.0 10/06/2017   LDLCALC 107 (H) 06/03/2017   ALT 19 10/06/2017   AST 21 10/06/2017   NA 137 10/06/2017   K 4.1 10/06/2017   CL 102 10/06/2017   CREATININE 0.97 10/06/2017   BUN 17 10/06/2017   CO2 26 10/06/2017   TSH 1.39 10/06/2017   HGBA1C 7.2 (H) 10/06/2017    No results found.   Assessment & Plan:  Plan  I have discontinued Aidyn B. Willets's atorvastatin and triamterene-hydrochlorothiazide. I am also having her maintain her fluticasone, cetirizine, norgestrel-ethinyl estradiol, omeprazole, ezetimibe, metFORMIN, rosuvastatin, blood glucose meter kit and supplies, metoprolol succinate, lisinopril, ONE TOUCH ULTRA TEST, and ONETOUCH DELICA LANCETS 38L.  No orders of the defined types were placed in this encounter.  ekg-- sinus , nonsp st changes   Problem List Items Addressed This Visit      Unprioritized   Dizziness - Primary    ? Caused by low bp Stop  maxzide  F/u 2-3 weeks      Relevant Orders   EKG 12-Lead (Completed)   CBC with Differential/Platelet   Vitamin D 1,25 dihydroxy   Comprehensive metabolic panel   TSH   POCT Urinalysis Dipstick (Automated)   Hypertension, benign essential, goal below 140/90    Running very low=== stop maxzide F/u 2-3 weeks or sooner prm          Follow-up: Return in about 2 weeks (around 12/21/2017), or if symptoms worsen or fail to improve, for bp check.  Ann Held, DO

## 2017-12-07 NOTE — Telephone Encounter (Signed)
Duplicate triage encounter.  

## 2017-12-07 NOTE — Assessment & Plan Note (Signed)
Running very low=== stop maxzide F/u 2-3 weeks or sooner prm

## 2017-12-08 ENCOUNTER — Encounter: Payer: BLUE CROSS/BLUE SHIELD | Admitting: Gastroenterology

## 2017-12-08 ENCOUNTER — Other Ambulatory Visit: Payer: Self-pay | Admitting: *Deleted

## 2017-12-08 DIAGNOSIS — E876 Hypokalemia: Secondary | ICD-10-CM

## 2017-12-08 LAB — CBC WITH DIFFERENTIAL/PLATELET
BASOS ABS: 0.1 10*3/uL (ref 0.0–0.1)
Basophils Relative: 0.9 % (ref 0.0–3.0)
Eosinophils Absolute: 0.1 10*3/uL (ref 0.0–0.7)
Eosinophils Relative: 2 % (ref 0.0–5.0)
HEMATOCRIT: 37 % (ref 36.0–46.0)
HEMOGLOBIN: 12.9 g/dL (ref 12.0–15.0)
LYMPHS ABS: 2 10*3/uL (ref 0.7–4.0)
LYMPHS PCT: 28.3 % (ref 12.0–46.0)
MCHC: 34.9 g/dL (ref 30.0–36.0)
MCV: 87.6 fl (ref 78.0–100.0)
MONOS PCT: 10 % (ref 3.0–12.0)
Monocytes Absolute: 0.7 10*3/uL (ref 0.1–1.0)
NEUTROS PCT: 58.8 % (ref 43.0–77.0)
Neutro Abs: 4.1 10*3/uL (ref 1.4–7.7)
Platelets: 276 10*3/uL (ref 150.0–400.0)
RBC: 4.23 Mil/uL (ref 3.87–5.11)
RDW: 12.7 % (ref 11.5–15.5)
WBC: 7 10*3/uL (ref 4.0–10.5)

## 2017-12-08 LAB — COMPREHENSIVE METABOLIC PANEL
ALBUMIN: 4.1 g/dL (ref 3.5–5.2)
ALK PHOS: 45 U/L (ref 39–117)
ALT: 18 U/L (ref 0–35)
AST: 21 U/L (ref 0–37)
BILIRUBIN TOTAL: 1.2 mg/dL (ref 0.2–1.2)
BUN: 18 mg/dL (ref 6–23)
CO2: 30 mEq/L (ref 19–32)
Calcium: 10.1 mg/dL (ref 8.4–10.5)
Chloride: 94 mEq/L — ABNORMAL LOW (ref 96–112)
Creatinine, Ser: 1.41 mg/dL — ABNORMAL HIGH (ref 0.40–1.20)
GFR: 41.54 mL/min — AB (ref >60.00)
GLUCOSE: 107 mg/dL — AB (ref 70–99)
Potassium: 2.8 mEq/L — CL (ref 3.5–5.1)
Sodium: 137 mEq/L (ref 135–145)
TOTAL PROTEIN: 7.1 g/dL (ref 6.0–8.3)

## 2017-12-08 LAB — TSH: TSH: 2.73 u[IU]/mL (ref 0.35–4.50)

## 2017-12-08 MED ORDER — POTASSIUM CHLORIDE CRYS ER 20 MEQ PO TBCR: EXTENDED_RELEASE_TABLET | ORAL | 0 refills | Status: DC

## 2017-12-08 MED FILL — POTASSIUM CL ER 20 MEQ TAB: 20 | 29 days supply | Qty: 30 | Fill #0

## 2017-12-08 MED FILL — ELINEST-28 TABLET: 0.3-30 | 63 days supply | Qty: 84 | Fill #3

## 2017-12-09 ENCOUNTER — Ambulatory Visit: Payer: Self-pay

## 2017-12-09 NOTE — Telephone Encounter (Signed)
Incoming call from patient, Who complains of retaining fluid since Dr.  Zola Button discontinued the medication Maxide.  Patient  States that she needs to get the fluid off.  Maxide was d/c on [place].  Patient states hand are swollen.  Lastest b/p reading 89/60 heart rate 81 Patient remains on Lisinopril and Toprol xl .  Patient states she has a lab appointment  on Friday.  Also has an appointment on 01/04/18 with Dr.  Abner Greenspan.  Patient states she was diagnosed with diabetes about 2 months ago.  Relates her being tired to recent diagnosis of diabetes.  Reviewed care advice with patient Voiced understanding.  Patient schedule for an appointment on Thursday 12/10/17 @ 2:30 pm with Dr.  Zola Button. Patient voices understanding.    Reason for Disposition . [1] Systolic BP 90-110 AND [2] taking blood pressure medications AND [3] NOT dizzy, lightheaded or weak  Answer Assessment - Initial Assessment Questions 1. BLOOD PRESSURE: "What is the blood pressure?" "Did you take at least two measurements 5 minutes apart?"     This morning 85/68 2. ONSET: "When did you take your blood pressure?"      This am 3. HOW: "How did you obtain the blood pressure?" (e.g., visiting nurse, automatic home BP monitor)     yes 4. HISTORY: "Do you have a history of low blood pressure?" "What is your blood pressure normally?"     Normally b/p is high 5. MEDICATIONS: "Are you taking any medications for blood pressure?" If yes: "Have they been changed recently?"     Lisinopril andToprol XL 6. PULSE RATE: "Do you know what your pulse rate is?"      81 7. OTHER SYMPTOMS: "Have you been sick recently?" "Have you had a recent injury?"     no 8. PREGNANCY: "Is there any chance you are pregnant?" "When was your last menstrual period?"    no  Protocols used: LOW BLOOD PRESSURE-A-AH

## 2017-12-10 ENCOUNTER — Encounter: Payer: Self-pay | Admitting: Family Medicine

## 2017-12-10 ENCOUNTER — Ambulatory Visit: Payer: BLUE CROSS/BLUE SHIELD | Admitting: Family Medicine

## 2017-12-10 VITALS — BP 98/60 | HR 82 | Temp 98.5°F | Resp 16 | Ht 63.5 in | Wt 145.2 lb

## 2017-12-10 DIAGNOSIS — R609 Edema, unspecified: Secondary | ICD-10-CM | POA: Diagnosis not present

## 2017-12-10 DIAGNOSIS — I10 Essential (primary) hypertension: Secondary | ICD-10-CM | POA: Diagnosis not present

## 2017-12-10 DIAGNOSIS — E876 Hypokalemia: Secondary | ICD-10-CM | POA: Diagnosis not present

## 2017-12-10 LAB — VITAMIN D 1,25 DIHYDROXY
VITAMIN D 1, 25 (OH) TOTAL: 34 pg/mL (ref 18–72)
Vitamin D2 1, 25 (OH)2: <8 pg/mL
Vitamin D3 1, 25 (OH)2: 34 pg/mL

## 2017-12-10 MED ORDER — SPIRONOLACTONE 25 MG PO TABS: 25.0000 mg | ORAL_TABLET | Freq: Every day | ORAL | 1 refills | Status: DC

## 2017-12-10 MED ORDER — METOPROLOL SUCCINATE ER 50 MG PO TB24: ORAL_TABLET | ORAL | 2 refills | Status: DC

## 2017-12-10 MED FILL — SPIRONOLACTONE 25 MG TABLET: 25 | 30 days supply | Qty: 30 | Fill #0

## 2017-12-10 NOTE — Patient Instructions (Signed)

## 2017-12-10 NOTE — Progress Notes (Signed)
Patient ID: Marissa Guzman, female    DOB: 1965-08-03  Age: 52 y.o. MRN: [id number]    Subjective:  Subjective  HPI Marissa Guzman presents for f/u low potassium / bp -- we stopped her maxzide and now she c/o swelling in low ext.  She still feels dizzy/ light headed but it is better   Review of Systems  Constitutional: Negative for fever.  HENT: Negative for congestion.   Respiratory: Negative for shortness of breath.   Cardiovascular: Positive for leg swelling. Negative for chest pain and palpitations.  Gastrointestinal: Negative for abdominal pain, blood in stool and nausea.  Genitourinary: Negative for dysuria and frequency.  Skin: Negative for rash.  Allergic/Immunologic: Negative for environmental allergies.  Neurological: Positive for dizziness and light-headedness. Negative for headaches.  Psychiatric/Behavioral: The patient is not nervous/anxious.     History Past Medical History:  Diagnosis Date  . Acute bronchitis 05/16/2012  . Allergic state 05/16/2012  . Allergy   . Constipation 04/16/2014  . Diabetes mellitus without complication ([place])   . GERD (gastroesophageal reflux disease) 06/12/2013  . Hip pain, bilateral 08/19/2015  . HTN (hypertension)   . Hyperglycemia 08/10/2015  . Hyperlipidemia   . Hypokalemia 11/14/2012  . Overweight 08/14/2016  . Seasonal allergies     She has a past surgical history that includes Hemorrhoid surgery and Cesarean section.   Her family history includes Colon polyps in her mother, sister, and sister; Dementia in her maternal grandmother; Diabetes in her maternal grandmother; Heart disease in her father; Hyperlipidemia in her maternal grandmother, mother, and sister; Hypertension in her father, mother, and sister; Lung cancer in her father; Melanoma in her father.She reports that she has never smoked. She has never used smokeless tobacco. She reports that she does not drink alcohol or use drugs.  Current Outpatient Medications on File Prior to  Visit  Medication Sig Dispense Refill  . blood glucose meter kit and supplies KIT Dispense based on patient and insurance preference. Check BS  daily as directed.  DzE11.9 1 each 0  . cetirizine (ZYRTEC) 10 MG tablet Take 10 mg by mouth daily as needed.    . ezetimibe (ZETIA) 10 MG tablet Take 1 tablet (10 mg total) by mouth daily. 90 tablet 2  . fluticasone (FLONASE) 50 MCG/ACT nasal spray Place 2 sprays into the nose daily as needed.    . metFORMIN (GLUCOPHAGE) 500 MG tablet Take 1 tablet (500 mg total) by mouth 2 (two) times daily with a meal. 180 tablet 1  . norgestrel-ethinyl estradiol (LO/OVRAL,CRYSELLE) 0.3-30 MG-MCG tablet Take 1 tablet by mouth daily. 1 Package 0  . omeprazole (PRILOSEC) 20 MG capsule Take 20 mg by mouth daily.    . ONE TOUCH ULTRA TEST test strip   0  . ONETOUCH DELICA LANCETS 90W MISC   0  . rosuvastatin (CRESTOR) 40 MG tablet Take 1 tablet (40 mg total) by mouth daily. 90 tablet 1   No current facility-administered medications on file prior to visit.      Objective:  Objective  Physical Exam  Constitutional: She is oriented to person, place, and time. She appears well-developed and well-nourished.  HENT:  Head: Normocephalic and atraumatic.  Eyes: Conjunctivae and EOM are normal.  Neck: Normal range of motion. Neck supple. No JVD present. Carotid bruit is not present. No thyromegaly present.  Cardiovascular: Normal rate, regular rhythm and normal heart sounds.  No murmur heard. Pulmonary/Chest: Effort normal and breath sounds normal. No respiratory distress. She has no  wheezes. She has no rales. She exhibits no tenderness.  Musculoskeletal: She exhibits edema.       Right ankle: She exhibits swelling.       Left ankle: She exhibits swelling.  Neurological: She is alert and oriented to person, place, and time.  Psychiatric: She has a normal mood and affect.  Nursing note and vitals reviewed.  BP 98/60 (BP Location: Right Arm, Cuff Size: Normal)   Pulse  82   Temp 98.5 F (36.9 C) (Oral)   Resp 16   Ht 5' 3.5" (1.613 m)   Wt 145 lb 3.2 oz (65.9 kg)   SpO2 96%   BMI 25.32 kg/m  Wt Readings from Last 3 Encounters:  12/10/17 145 lb 3.2 oz (65.9 kg)  12/07/17 141 lb 9.6 oz (64.2 kg)  11/24/17 148 lb (67.1 kg)     Lab Results  Component Value Date   WBC 7.0 12/07/2017   HGB 12.9 12/07/2017   HCT 37.0 12/07/2017   PLT 276.0 12/07/2017   GLUCOSE 91 12/10/2017   CHOL 213 (H) 10/06/2017   TRIG 274.0 (H) 10/06/2017   HDL 36.00 (L) 10/06/2017   LDLDIRECT 145.0 10/06/2017   LDLCALC 107 (H) 06/03/2017   ALT 18 12/07/2017   AST 21 12/07/2017   NA 140 12/10/2017   K 3.1 (L) 12/10/2017   CL 99 12/10/2017   CREATININE 1.39 (H) 12/10/2017   BUN 17 12/10/2017   CO2 28 12/10/2017   TSH 2.73 12/07/2017   HGBA1C 7.2 (H) 10/06/2017    No results found.   Assessment & Plan:  Plan  I have discontinued Marissa Guzman's metoprolol succinate, lisinopril, and potassium chloride SA. I have also changed her metoprolol succinate. Additionally, I am having her start on spironolactone. Lastly, I am having her maintain her fluticasone, cetirizine, norgestrel-ethinyl estradiol, omeprazole, ezetimibe, metFORMIN, rosuvastatin, blood glucose meter kit and supplies, ONE TOUCH ULTRA TEST, and ONETOUCH DELICA LANCETS 89Q.  Meds ordered this encounter  Medications  . DISCONTD: metoprolol succinate (TOPROL-XL) 50 MG 24 hr tablet    Sig: 1/2 tab bid Take with or immediately following a meal.    Dispense:  60 tablet    Refill:  2  . spironolactone (ALDACTONE) 25 MG tablet    Sig: Take 1 tablet (25 mg total) by mouth daily.    Dispense:  30 tablet    Refill:  1  . metoprolol succinate (TOPROL-XL) 50 MG 24 hr tablet    Sig: 1 1/2 tab po qam and 1 po q pm Take with or immediately following a meal.    Dispense:  180 tablet    Refill:  2    Problem List Items Addressed This Visit      Unprioritized   Edema - Primary   Relevant Medications    spironolactone (ALDACTONE) 25 MG tablet   Hypertension, benign essential, goal below 140/90    Running low  Off maxide  con't metoprolol D/c lisinopril       Relevant Medications   spironolactone (ALDACTONE) 25 MG tablet   metoprolol succinate (TOPROL-XL) 50 MG 24 hr tablet   Hypokalemia   Relevant Orders   Basic metabolic panel (Completed)    Other Visit Diagnoses    Essential hypertension       Relevant Medications   spironolactone (ALDACTONE) 25 MG tablet   metoprolol succinate (TOPROL-XL) 50 MG 24 hr tablet      Follow-up: Return in about 2 weeks (around 12/24/2017), or if symptoms worsen or  fail to improve, for with pcp.  Ann Held, DO

## 2017-12-11 ENCOUNTER — Telehealth: Payer: Self-pay

## 2017-12-11 ENCOUNTER — Other Ambulatory Visit: Payer: BLUE CROSS/BLUE SHIELD

## 2017-12-11 LAB — BASIC METABOLIC PANEL
BUN: 17 mg/dL (ref 6–23)
CHLORIDE: 99 meq/L (ref 96–112)
CO2: 28 meq/L (ref 19–32)
CREATININE: 1.39 mg/dL — AB (ref 0.40–1.20)
Calcium: 9.5 mg/dL (ref 8.4–10.5)
GFR: 42.23 mL/min — ABNORMAL LOW (ref >60.00)
Glucose, Bld: 91 mg/dL (ref 70–99)
POTASSIUM: 3.1 meq/L — AB (ref 3.5–5.1)
Sodium: 140 mEq/L (ref 135–145)

## 2017-12-11 NOTE — Telephone Encounter (Signed)
Author phoned [facility] to cancel the metoprolol rx sent on 9/26 by Dr. Laury Axon per Dr. Ernst Spell request. Per pharmacist, Apolonio Schneiders had already cancelled.

## 2017-12-13 DIAGNOSIS — R609 Edema, unspecified: Secondary | ICD-10-CM | POA: Insufficient documentation

## 2017-12-13 NOTE — Assessment & Plan Note (Signed)
Running low  Off maxide  con't metoprolol D/c lisinopril

## 2017-12-16 ENCOUNTER — Telehealth: Payer: Self-pay | Admitting: *Deleted

## 2017-12-16 ENCOUNTER — Telehealth: Payer: Self-pay | Admitting: Family Medicine

## 2017-12-16 NOTE — Telephone Encounter (Signed)
Copied from CRM [id number]. Topic: Quick Communication - See Telephone Encounter >> Dec 16, 2017 12:09 PM Arlyss Gandy, NT wrote: CRM for notification. See Telephone encounter for: 12/16/17. Pt states she saw Dr. Zola Button when Dr. Abner Greenspan was out of the office and states that during the appt Dr. Zola Button discontinued her blood pressure medication with a diuretic in it and prescribed metoprolol succinate. Patient states this medication does not seem to be working for her and would like to speak with Dr. Abner Greenspan about getting a new rx.

## 2017-12-16 NOTE — Telephone Encounter (Signed)
Received Medical records from [facility]; forwarded to provider/SLS 10/02

## 2017-12-18 NOTE — Telephone Encounter (Signed)
Left message for patient to call the office back  [facility] may handle

## 2017-12-18 NOTE — Telephone Encounter (Signed)
Please advise 

## 2017-12-18 NOTE — Telephone Encounter (Signed)
I would recommend that she schedule office visit with PCP for re-evaluation and further recommendations.

## 2017-12-22 ENCOUNTER — Encounter: Payer: Self-pay | Admitting: Family Medicine

## 2017-12-23 ENCOUNTER — Ambulatory Visit: Payer: BLUE CROSS/BLUE SHIELD

## 2017-12-24 ENCOUNTER — Ambulatory Visit: Payer: BLUE CROSS/BLUE SHIELD | Admitting: Family Medicine

## 2017-12-24 ENCOUNTER — Encounter: Payer: Self-pay | Admitting: Family Medicine

## 2017-12-24 VITALS — BP 98/72 | HR 83 | Temp 98.0°F | Resp 18 | Wt 145.0 lb

## 2017-12-24 DIAGNOSIS — M791 Myalgia, unspecified site: Secondary | ICD-10-CM | POA: Diagnosis not present

## 2017-12-24 DIAGNOSIS — E119 Type 2 diabetes mellitus without complications: Secondary | ICD-10-CM

## 2017-12-24 DIAGNOSIS — I10 Essential (primary) hypertension: Secondary | ICD-10-CM

## 2017-12-24 DIAGNOSIS — N289 Disorder of kidney and ureter, unspecified: Secondary | ICD-10-CM

## 2017-12-24 DIAGNOSIS — E876 Hypokalemia: Secondary | ICD-10-CM

## 2017-12-24 LAB — COMPREHENSIVE METABOLIC PANEL
ALBUMIN: 4.2 g/dL (ref 3.5–5.2)
ALK PHOS: 48 U/L (ref 39–117)
ALT: 15 U/L (ref 0–35)
AST: 20 U/L (ref 0–37)
BUN: 22 mg/dL (ref 6–23)
CALCIUM: 10.9 mg/dL — AB (ref 8.4–10.5)
CO2: 25 mEq/L (ref 19–32)
Chloride: 101 mEq/L (ref 96–112)
Creatinine, Ser: 1.19 mg/dL (ref 0.40–1.20)
GFR: 50.52 mL/min — AB (ref >60.00)
Glucose, Bld: 141 mg/dL — ABNORMAL HIGH (ref 70–99)
POTASSIUM: 5 meq/L (ref 3.5–5.1)
Sodium: 134 mEq/L — ABNORMAL LOW (ref 135–145)
Total Bilirubin: 1 mg/dL (ref 0.2–1.2)
Total Protein: 7.4 g/dL (ref 6.0–8.3)

## 2017-12-24 LAB — MAGNESIUM: MAGNESIUM: 2.2 mg/dL (ref 1.5–2.5)

## 2017-12-24 MED ORDER — LISINOPRIL 20 MG PO TABS: 20.0000 mg | ORAL_TABLET | Freq: Every day | ORAL | 1 refills | Status: DC

## 2017-12-24 MED ORDER — TRIAMTERENE-HCTZ 37.5-25 MG PO TABS: 1.0000 | ORAL_TABLET | Freq: Every day | ORAL | 1 refills | Status: DC

## 2017-12-24 MED ORDER — LISINOPRIL 10 MG PO TABS: 10.0000 mg | ORAL_TABLET | Freq: Every day | ORAL | 1 refills | Status: DC

## 2017-12-24 MED FILL — LISINOPRIL 10 MG TABLET: 10 | 90 days supply | Qty: 90 | Fill #0

## 2017-12-24 MED FILL — TRIAMTERENE-HCTZ 37.5-25 MG: 37.5-25 | 90 days supply | Qty: 90 | Fill #0

## 2017-12-24 NOTE — Assessment & Plan Note (Addendum)
Off of BP meds noted bp went back up to 132/86, 133/83, 126/82 so she restarted Maxzide and Lisinopril 10 mg bid. Recheck labs today. dorp Lisinopril to 10 mg daily.

## 2017-12-24 NOTE — Assessment & Plan Note (Signed)
Encouraged hydration and check labs

## 2017-12-24 NOTE — Patient Instructions (Signed)
60 to 80 ounces daily if possible   Hypertension Hypertension, commonly called high blood pressure, is when the force of blood pumping through the arteries is too strong. The arteries are the blood vessels that carry blood from the heart throughout the body. Hypertension forces the heart to work harder to pump blood and may cause arteries to become narrow or stiff. Having untreated or uncontrolled hypertension can cause heart attacks, strokes, kidney disease, and other problems. A blood pressure reading consists of a higher number over a lower number. Ideally, your blood pressure should be below 120/80. The first ("top") number is called the systolic pressure. It is a measure of the pressure in your arteries as your heart beats. The second ("bottom") number is called the diastolic pressure. It is a measure of the pressure in your arteries as the heart relaxes. What are the causes? The cause of this condition is not known. What increases the risk? Some risk factors for high blood pressure are under your control. Others are not. Factors you can change  Smoking.  Having type 2 diabetes mellitus, high cholesterol, or both.  Not getting enough exercise or physical activity.  Being overweight.  Having too much fat, sugar, calories, or salt (sodium) in your diet.  Drinking too much alcohol. Factors that are difficult or impossible to change  Having chronic kidney disease.  Having a family history of high blood pressure.  Age. Risk increases with age.  Race. You may be at higher risk if you are African-American.  Gender. Men are at higher risk than women before age 19. After age 56, women are at higher risk than men.  Having obstructive sleep apnea.  Stress. What are the signs or symptoms? Extremely high blood pressure (hypertensive crisis) may cause:  Headache.  Anxiety.  Shortness of breath.  Nosebleed.  Nausea and vomiting.  Severe chest pain.  Jerky movements you  cannot control (seizures).  How is this diagnosed? This condition is diagnosed by measuring your blood pressure while you are seated, with your arm resting on a surface. The cuff of the blood pressure monitor will be placed directly against the skin of your upper arm at the level of your heart. It should be measured at least twice using the same arm. Certain conditions can cause a difference in blood pressure between your right and left arms. Certain factors can cause blood pressure readings to be lower or higher than normal (elevated) for a short period of time:  When your blood pressure is higher when you are in a health care provider's office than when you are at home, this is called white coat hypertension. Most people with this condition do not need medicines.  When your blood pressure is higher at home than when you are in a health care provider's office, this is called masked hypertension. Most people with this condition may need medicines to control blood pressure.  If you have a high blood pressure reading during one visit or you have normal blood pressure with other risk factors:  You may be asked to return on a different day to have your blood pressure checked again.  You may be asked to monitor your blood pressure at home for 1 week or longer.  If you are diagnosed with hypertension, you may have other blood or imaging tests to help your health care provider understand your overall risk for other conditions. How is this treated? This condition is treated by making healthy lifestyle changes, such as eating healthy  foods, exercising more, and reducing your alcohol intake. Your health care provider may prescribe medicine if lifestyle changes are not enough to get your blood pressure under control, and if:  Your systolic blood pressure is above 130.  Your diastolic blood pressure is above 80.  Your personal target blood pressure may vary depending on your medical conditions, your age,  and other factors. Follow these instructions at home: Eating and drinking  Eat a diet that is high in fiber and potassium, and low in sodium, added sugar, and fat. An example eating plan is called the DASH (Dietary Approaches to Stop Hypertension) diet. To eat this way: ? Eat plenty of fresh fruits and vegetables. Try to fill half of your plate at each meal with fruits and vegetables. ? Eat whole grains, such as whole wheat pasta, brown rice, or whole grain bread. Fill about one quarter of your plate with whole grains. ? Eat or drink low-fat dairy products, such as skim milk or low-fat yogurt. ? Avoid fatty cuts of meat, processed or cured meats, and poultry with skin. Fill about one quarter of your plate with lean proteins, such as fish, chicken without skin, beans, eggs, and tofu. ? Avoid premade and processed foods. These tend to be higher in sodium, added sugar, and fat.  Reduce your daily sodium intake. Most people with hypertension should eat less than 1,500 mg of sodium a day.  Limit alcohol intake to no more than 1 drink a day for nonpregnant women and 2 drinks a day for men. One drink equals 12 oz of beer, 5 oz of wine, or 1 oz of hard liquor. Lifestyle  Work with your health care provider to maintain a healthy body weight or to lose weight. Ask what an ideal weight is for you.  Get at least 30 minutes of exercise that causes your heart to beat faster (aerobic exercise) most days of the week. Activities may include walking, swimming, or biking.  Include exercise to strengthen your muscles (resistance exercise), such as pilates or lifting weights, as part of your weekly exercise routine. Try to do these types of exercises for 30 minutes at least 3 days a week.  Do not use any products that contain nicotine or tobacco, such as cigarettes and e-cigarettes. If you need help quitting, ask your health care provider.  Monitor your blood pressure at home as told by your health care  provider.  Keep all follow-up visits as told by your health care provider. This is important. Medicines  Take over-the-counter and prescription medicines only as told by your health care provider. Follow directions carefully. Blood pressure medicines must be taken as prescribed.  Do not skip doses of blood pressure medicine. Doing this puts you at risk for problems and can make the medicine less effective.  Ask your health care provider about side effects or reactions to medicines that you should watch for. Contact a health care provider if:  You think you are having a reaction to a medicine you are taking.  You have headaches that keep coming back (recurring).  You feel dizzy.  You have swelling in your ankles.  You have trouble with your vision. Get help right away if:  You develop a severe headache or confusion.  You have unusual weakness or numbness.  You feel faint.  You have severe pain in your chest or abdomen.  You vomit repeatedly.  You have trouble breathing. Summary  Hypertension is when the force of blood pumping through your  arteries is too strong. If this condition is not controlled, it may put you at risk for serious complications.  Your personal target blood pressure may vary depending on your medical conditions, your age, and other factors. For most people, a normal blood pressure is less than 120/80.  Hypertension is treated with lifestyle changes, medicines, or a combination of both. Lifestyle changes include weight loss, eating a healthy, low-sodium diet, exercising more, and limiting alcohol. This information is not intended to replace advice given to you by your health care provider. Make sure you discuss any questions you have with your health care provider. Document Released: 03/03/2005 Document Revised: 01/30/2016 Document Reviewed: 01/30/2016 Elsevier Interactive Patient Education  [employer].

## 2017-12-24 NOTE — Progress Notes (Signed)
Subjective:    Patient ID: Marissa Guzman, female    DOB: [birth year], 52 y.o.   MRN: [id number]  No chief complaint on file.   HPI Patient is in today for follow up on hypertension and edema. She has had trouble with low blood pressure and pedal edema. She did not tolerate the changes in her meds so she stopped the Spironolactone and went back to her original meds and is feeling better. No recent febrile illness or hospitalizations. Denies CP/palp/SOB/HA/congestion/fevers/GI or GU c/o. Taking meds as prescribed. Still struggles with myalgias  Past Medical History:  Diagnosis Date  . Acute bronchitis 05/16/2012  . Allergic state 05/16/2012  . Allergy   . Constipation 04/16/2014  . Diabetes mellitus without complication ([place])   . DM2 (diabetes mellitus, type 2) ([place]) 08/10/2015  . GERD (gastroesophageal reflux disease) 06/12/2013  . Hip pain, bilateral 08/19/2015  . HTN (hypertension)   . Hyperglycemia 08/10/2015  . Hyperlipidemia   . Hypokalemia 11/14/2012  . Overweight 08/14/2016  . Seasonal allergies     Past Surgical History:  Procedure Laterality Date  . CESAREAN SECTION    . HEMORRHOID SURGERY     two    Family History  Problem Relation Age of Onset  . Heart disease Father        Deceased  . Hypertension Father   . Lung cancer Father   . Melanoma Father        Eye Removal  . Hyperlipidemia Mother   . Hypertension Mother   . Colon polyps Mother   . Hyperlipidemia Maternal Grandmother   . Diabetes Maternal Grandmother   . Dementia Maternal Grandmother   . Hypertension Sister        x2  . Colon polyps Sister   . Hyperlipidemia Sister        x2  . Colon polyps Sister   . Colon cancer Neg Hx   . Esophageal cancer Neg Hx   . Rectal cancer Neg Hx   . Stomach cancer Neg Hx     Social History   Socioeconomic History  . Marital status: Married    Spouse name: Not on file  . Number of children: Not on file  . Years of education: Not on file  . Highest education  level: Not on file  Occupational History  . Not on file  Social Needs  . Financial resource strain: Not on file  . Food insecurity:    Worry: Not on file    Inability: Not on file  . Transportation needs:    Medical: Not on file    Non-medical: Not on file  Tobacco Use  . Smoking status: Never Smoker  . Smokeless tobacco: Never Used  Substance and Sexual Activity  . Alcohol use: No  . Drug use: No  . 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 Physical activity:    Days per week: Not on file    Minutes per session: Not on file  . Stress: Not on file  Relationships  . Social connections:    Talks on phone: Not on file    Gets together: Not on file    Attends religious service: Not on file    Active member of club or organization: Not on file    Attends meetings of clubs or organizations: Not on file    Relationship status: Not on file  .  Intimate partner violence:    Fear of current or ex partner: Not on file    Emotionally abused: Not on file    Physically abused: Not on file    Forced sexual activity: Not on file  Other Topics Concern  . Not on file  Social History Narrative  . Not on file    Outpatient Medications Prior to Visit  Medication Sig Dispense Refill  . blood glucose meter kit and supplies KIT Dispense based on patient and insurance preference. Check BS  daily as directed.  DzE11.9 1 each 0  . cetirizine (ZYRTEC) 10 MG tablet Take 10 mg by mouth daily as needed.    . ezetimibe (ZETIA) 10 MG tablet Take 1 tablet (10 mg total) by mouth daily. 90 tablet 2  . fluticasone (FLONASE) 50 MCG/ACT nasal spray Place 2 sprays into the nose daily as needed.    . metFORMIN (GLUCOPHAGE) 500 MG tablet Take 1 tablet (500 mg total) by mouth 2 (two) times daily with a meal. 180 tablet 1  . metoprolol succinate (TOPROL-XL) 50 MG 24 hr tablet 1 1/2 tab po qam and 1 po q pm  Take with or immediately following a meal. 180 tablet 2  . norgestrel-ethinyl estradiol (LO/OVRAL,CRYSELLE) 0.3-30 MG-MCG tablet Take 1 tablet by mouth daily. 1 Package 0  . omeprazole (PRILOSEC) 20 MG capsule Take 20 mg by mouth daily.    . ONE TOUCH ULTRA TEST test strip   0  . ONETOUCH DELICA LANCETS 70Y MISC   0  . rosuvastatin (CRESTOR) 40 MG tablet Take 1 tablet (40 mg total) by mouth daily. 90 tablet 1  . spironolactone (ALDACTONE) 25 MG tablet Take 1 tablet (25 mg total) by mouth daily. 30 tablet 1   No facility-administered medications prior to visit.     Allergies  Allergen Reactions  . Doxycycline Monohydrate     GI Issues.  . Niacin And Related     Extreme flushing despite taking it correctly  . Tetracyclines & Related Other (See Comments)    GI Issues.    Review of Systems  Constitutional: Negative for fever and malaise/fatigue.  HENT: Negative for congestion.   Eyes: Negative for blurred vision.  Respiratory: Negative for shortness of breath.   Cardiovascular: Negative for chest pain, palpitations and leg swelling.  Gastrointestinal: Negative for abdominal pain, blood in stool and nausea.  Genitourinary: Negative for dysuria and frequency.  Musculoskeletal: Positive for myalgias. Negative for falls.  Skin: Negative for rash.  Neurological: Negative for dizziness, loss of consciousness and headaches.  Endo/Heme/Allergies: Negative for environmental allergies.  Psychiatric/Behavioral: Negative for depression. The patient is not nervous/anxious.        Objective:    Physical Exam  BP 98/72 (BP Location: Left Arm, Patient Position: Sitting, Cuff Size: Normal)   Pulse 83   Temp 98 F (36.7 C) (Oral)   Resp 18   Wt 145 lb (65.8 kg)   SpO2 98%   BMI 25.28 kg/m  Wt Readings from Last 3 Encounters:  12/24/17 145 lb (65.8 kg)  12/10/17 145 lb 3.2 oz (65.9 kg)  12/07/17 141 lb 9.6 oz (64.2 kg)     Lab Results  Component Value Date   WBC 7.0 12/07/2017    HGB 12.9 12/07/2017   HCT 37.0 12/07/2017   PLT 276.0 12/07/2017   GLUCOSE 91 12/10/2017   CHOL 213 (H) 10/06/2017   TRIG 274.0 (H) 10/06/2017   HDL 36.00 (L) 10/06/2017   LDLDIRECT 145.0 10/06/2017  LDLCALC 107 (H) 06/03/2017   ALT 18 12/07/2017   AST 21 12/07/2017   NA 140 12/10/2017   K 3.1 (L) 12/10/2017   CL 99 12/10/2017   CREATININE 1.39 (H) 12/10/2017   BUN 17 12/10/2017   CO2 28 12/10/2017   TSH 2.73 12/07/2017   HGBA1C 7.2 (H) 10/06/2017    Lab Results  Component Value Date   TSH 2.73 12/07/2017   Lab Results  Component Value Date   WBC 7.0 12/07/2017   HGB 12.9 12/07/2017   HCT 37.0 12/07/2017   MCV 87.6 12/07/2017   PLT 276.0 12/07/2017   Lab Results  Component Value Date   NA 140 12/10/2017   K 3.1 (L) 12/10/2017   CO2 28 12/10/2017   GLUCOSE 91 12/10/2017   BUN 17 12/10/2017   CREATININE 1.39 (H) 12/10/2017   BILITOT 1.2 12/07/2017   ALKPHOS 45 12/07/2017   AST 21 12/07/2017   ALT 18 12/07/2017   PROT 7.1 12/07/2017   ALBUMIN 4.1 12/07/2017   CALCIUM 9.5 12/10/2017   GFR 42.23 (L) 12/10/2017   Lab Results  Component Value Date   CHOL 213 (H) 10/06/2017   Lab Results  Component Value Date   HDL 36.00 (L) 10/06/2017   Lab Results  Component Value Date   LDLCALC 107 (H) 06/03/2017   Lab Results  Component Value Date   TRIG 274.0 (H) 10/06/2017   Lab Results  Component Value Date   CHOLHDL 6 10/06/2017   Lab Results  Component Value Date   HGBA1C 7.2 (H) 10/06/2017       Assessment & Plan:   Problem List Items Addressed This Visit    Hypertension, benign essential, goal below 140/90    Off of BP meds noted bp went back up to 132/86, 133/83, 126/82 so she restarted Maxzide and Lisinopril 10 mg bid. Recheck labs today. dorp Lisinopril to 10 mg daily.      Relevant Medications   triamterene-hydrochlorothiazide (MAXZIDE-25) 37.5-25 MG tablet   lisinopril (PRINIVIL,ZESTRIL) 10 MG tablet   Hypokalemia    She is taking her  potassium tabs, will check level      DM2 (diabetes mellitus, type 2) ([facility])    hgba1c acceptable, minimize simple carbs. Increase exercise as tolerated. Continue current meds      Relevant Medications   lisinopril (PRINIVIL,ZESTRIL) 10 MG tablet   Myalgia - Primary    Encouraged hydration and check labs      Relevant Orders   Comprehensive metabolic panel   Magnesium   Renal insufficiency    Mild, recheck cmp and increase hydration and she admits she does not drink 60 ounces daily         I have discontinued Cadie B. Mineer's spironolactone and lisinopril. I have also changed her lisinopril. Additionally, I am having her maintain her fluticasone, cetirizine, norgestrel-ethinyl estradiol, omeprazole, ezetimibe, metFORMIN, rosuvastatin, blood glucose meter kit and supplies, ONE TOUCH ULTRA TEST, ONETOUCH DELICA LANCETS 69G, metoprolol succinate, and triamterene-hydrochlorothiazide.  Meds ordered this encounter  Medications  . DISCONTD: lisinopril (PRINIVIL,ZESTRIL) 20 MG tablet    Sig: Take 1 tablet (20 mg total) by mouth daily.    Dispense:  90 tablet    Refill:  1  . triamterene-hydrochlorothiazide (MAXZIDE-25) 37.5-25 MG tablet    Sig: Take 1 tablet by mouth daily.    Dispense:  90 tablet    Refill:  1  . lisinopril (PRINIVIL,ZESTRIL) 10 MG tablet    Sig: Take 1 tablet (10 mg total)  by mouth daily.    Dispense:  90 tablet    Refill:  1    Discontinue 20 mg dose     Penni Homans, MD

## 2017-12-24 NOTE — Assessment & Plan Note (Signed)
hgba1c acceptable, minimize simple carbs. Increase exercise as tolerated. Continue current meds 

## 2017-12-24 NOTE — Assessment & Plan Note (Signed)
She is taking her potassium tabs, will check level

## 2017-12-24 NOTE — Assessment & Plan Note (Signed)
Mild, recheck cmp and increase hydration and she admits she does not drink 60 ounces daily

## 2017-12-25 ENCOUNTER — Other Ambulatory Visit (INDEPENDENT_AMBULATORY_CARE_PROVIDER_SITE_OTHER): Payer: BLUE CROSS/BLUE SHIELD

## 2017-12-25 LAB — VITAMIN D 25 HYDROXY (VIT D DEFICIENCY, FRACTURES): VITD: 33.99 ng/mL (ref 30.00–100.00)

## 2017-12-28 MED FILL — ROSUVASTATIN CALCIUM 40 MG: 40 | 90 days supply | Qty: 90 | Fill #1

## 2017-12-28 MED FILL — METOPROLOL SUCCINATE ER 50: 50 | 72 days supply | Qty: 180 | Fill #1

## 2017-12-28 MED FILL — metFORMIN HCL 500 MG TABS: 500 | 90 days supply | Qty: 180 | Fill #1

## 2018-01-04 ENCOUNTER — Ambulatory Visit: Payer: BLUE CROSS/BLUE SHIELD | Admitting: Family Medicine

## 2018-01-04 DIAGNOSIS — I10 Essential (primary) hypertension: Secondary | ICD-10-CM

## 2018-01-04 DIAGNOSIS — R42 Dizziness and giddiness: Secondary | ICD-10-CM

## 2018-01-04 DIAGNOSIS — R0600 Dyspnea, unspecified: Secondary | ICD-10-CM

## 2018-01-04 DIAGNOSIS — E7800 Pure hypercholesterolemia, unspecified: Secondary | ICD-10-CM

## 2018-01-04 DIAGNOSIS — E119 Type 2 diabetes mellitus without complications: Secondary | ICD-10-CM | POA: Diagnosis not present

## 2018-01-04 LAB — COMPREHENSIVE METABOLIC PANEL
ALBUMIN: 3.8 g/dL (ref 3.5–5.2)
ALK PHOS: 46 U/L (ref 39–117)
ALT: 13 U/L (ref 0–35)
AST: 17 U/L (ref 0–37)
BILIRUBIN TOTAL: 0.8 mg/dL (ref 0.2–1.2)
BUN: 13 mg/dL (ref 6–23)
CO2: 23 mEq/L (ref 19–32)
Calcium: 9.2 mg/dL (ref 8.4–10.5)
Chloride: 104 mEq/L (ref 96–112)
Creatinine, Ser: 1.33 mg/dL — ABNORMAL HIGH (ref 0.40–1.20)
GFR: 44.43 mL/min — ABNORMAL LOW (ref >60.00)
GLUCOSE: 109 mg/dL — AB (ref 70–99)
POTASSIUM: 4.1 meq/L (ref 3.5–5.1)
SODIUM: 139 meq/L (ref 135–145)
TOTAL PROTEIN: 6.6 g/dL (ref 6.0–8.3)

## 2018-01-04 NOTE — Patient Instructions (Addendum)
Blood Pressure 100-140/60-90  Pulse 60-90   Hypertension Hypertension, commonly called high blood pressure, is when the force of blood pumping through the arteries is too strong. The arteries are the blood vessels that carry blood from the heart throughout the body. Hypertension forces the heart to work harder to pump blood and may cause arteries to become narrow or stiff. Having untreated or uncontrolled hypertension can cause heart attacks, strokes, kidney disease, and other problems. A blood pressure reading consists of a higher number over a lower number. Ideally, your blood pressure should be below 120/80. The first ("top") number is called the systolic pressure. It is a measure of the pressure in your arteries as your heart beats. The second ("bottom") number is called the diastolic pressure. It is a measure of the pressure in your arteries as the heart relaxes. What are the causes? The cause of this condition is not known. What increases the risk? Some risk factors for high blood pressure are under your control. Others are not. Factors you can change  Smoking.  Having type 2 diabetes mellitus, high cholesterol, or both.  Not getting enough exercise or physical activity.  Being overweight.  Having too much fat, sugar, calories, or salt (sodium) in your diet.  Drinking too much alcohol. Factors that are difficult or impossible to change  Having chronic kidney disease.  Having a family history of high blood pressure.  Age. Risk increases with age.  Race. You may be at higher risk if you are African-American.  Gender. Men are at higher risk than women before age 21. After age 45, women are at higher risk than men.  Having obstructive sleep apnea.  Stress. What are the signs or symptoms? Extremely high blood pressure (hypertensive crisis) may cause:  Headache.  Anxiety.  Shortness of breath.  Nosebleed.  Nausea and vomiting.  Severe chest pain.  Jerky  movements you cannot control (seizures).  How is this diagnosed? This condition is diagnosed by measuring your blood pressure while you are seated, with your arm resting on a surface. The cuff of the blood pressure monitor will be placed directly against the skin of your upper arm at the level of your heart. It should be measured at least twice using the same arm. Certain conditions can cause a difference in blood pressure between your right and left arms. Certain factors can cause blood pressure readings to be lower or higher than normal (elevated) for a short period of time:  When your blood pressure is higher when you are in a health care provider's office than when you are at home, this is called white coat hypertension. Most people with this condition do not need medicines.  When your blood pressure is higher at home than when you are in a health care provider's office, this is called masked hypertension. Most people with this condition may need medicines to control blood pressure.  If you have a high blood pressure reading during one visit or you have normal blood pressure with other risk factors:  You may be asked to return on a different day to have your blood pressure checked again.  You may be asked to monitor your blood pressure at home for 1 week or longer.  If you are diagnosed with hypertension, you may have other blood or imaging tests to help your health care provider understand your overall risk for other conditions. How is this treated? This condition is treated by making healthy lifestyle changes, such as eating healthy foods,  exercising more, and reducing your alcohol intake. Your health care provider may prescribe medicine if lifestyle changes are not enough to get your blood pressure under control, and if:  Your systolic blood pressure is above 130.  Your diastolic blood pressure is above 80.  Your personal target blood pressure may vary depending on your medical  conditions, your age, and other factors. Follow these instructions at home: Eating and drinking  Eat a diet that is high in fiber and potassium, and low in sodium, added sugar, and fat. An example eating plan is called the DASH (Dietary Approaches to Stop Hypertension) diet. To eat this way: ? Eat plenty of fresh fruits and vegetables. Try to fill half of your plate at each meal with fruits and vegetables. ? Eat whole grains, such as whole wheat pasta, brown rice, or whole grain bread. Fill about one quarter of your plate with whole grains. ? Eat or drink low-fat dairy products, such as skim milk or low-fat yogurt. ? Avoid fatty cuts of meat, processed or cured meats, and poultry with skin. Fill about one quarter of your plate with lean proteins, such as fish, chicken without skin, beans, eggs, and tofu. ? Avoid premade and processed foods. These tend to be higher in sodium, added sugar, and fat.  Reduce your daily sodium intake. Most people with hypertension should eat less than 1,500 mg of sodium a day.  Limit alcohol intake to no more than 1 drink a day for nonpregnant women and 2 drinks a day for men. One drink equals 12 oz of beer, 5 oz of wine, or 1 oz of hard liquor. Lifestyle  Work with your health care provider to maintain a healthy body weight or to lose weight. Ask what an ideal weight is for you.  Get at least 30 minutes of exercise that causes your heart to beat faster (aerobic exercise) most days of the week. Activities may include walking, swimming, or biking.  Include exercise to strengthen your muscles (resistance exercise), such as pilates or lifting weights, as part of your weekly exercise routine. Try to do these types of exercises for 30 minutes at least 3 days a week.  Do not use any products that contain nicotine or tobacco, such as cigarettes and e-cigarettes. If you need help quitting, ask your health care provider.  Monitor your blood pressure at home as told by  your health care provider.  Keep all follow-up visits as told by your health care provider. This is important. Medicines  Take over-the-counter and prescription medicines only as told by your health care provider. Follow directions carefully. Blood pressure medicines must be taken as prescribed.  Do not skip doses of blood pressure medicine. Doing this puts you at risk for problems and can make the medicine less effective.  Ask your health care provider about side effects or reactions to medicines that you should watch for. Contact a health care provider if:  You think you are having a reaction to a medicine you are taking.  You have headaches that keep coming back (recurring).  You feel dizzy.  You have swelling in your ankles.  You have trouble with your vision. Get help right away if:  You develop a severe headache or confusion.  You have unusual weakness or numbness.  You feel faint.  You have severe pain in your chest or abdomen.  You vomit repeatedly.  You have trouble breathing. Summary  Hypertension is when the force of blood pumping through your arteries  is too strong. If this condition is not controlled, it may put you at risk for serious complications.  Your personal target blood pressure may vary depending on your medical conditions, your age, and other factors. For most people, a normal blood pressure is less than 120/80.  Hypertension is treated with lifestyle changes, medicines, or a combination of both. Lifestyle changes include weight loss, eating a healthy, low-sodium diet, exercising more, and limiting alcohol. This information is not intended to replace advice given to you by your health care provider. Make sure you discuss any questions you have with your health care provider. Document Released: 03/03/2005 Document Revised: 01/30/2016 Document Reviewed: 01/30/2016 Elsevier Interactive Patient Education  [employer].

## 2018-01-04 NOTE — Assessment & Plan Note (Signed)
Encouraged heart healthy diet, increase exercise, avoid trans fats, consider a krill oil cap daily 

## 2018-01-04 NOTE — Assessment & Plan Note (Signed)
Well controlled, no changes to meds. Encouraged heart healthy diet such as the DASH diet and exercise as tolerated.  °

## 2018-01-04 NOTE — Assessment & Plan Note (Signed)
hgba1c acceptable, minimize simple carbs. Increase exercise as tolerated.  

## 2018-01-04 NOTE — Assessment & Plan Note (Signed)
Much better but still gets light headed when gets up too quickly reminded to hydrate and arise more slowly

## 2018-01-04 NOTE — Progress Notes (Signed)
Subjective:    Patient ID: Marissa Guzman, female    DOB: 1965-08-02, 52 y.o.   MRN: [id number]  No chief complaint on file.   HPI Patient is in today for follow up. She feels well today and her blood pressure is better controlled but she does note feeling light headed upon standing up too quickly. No syncope or chest pain. Does occasionally have a flutter without a second symptom. Does endorse dyspnea even with doing laundry at times. Denies CP/HA/congestion/fevers/GI or GU c/o. Taking meds as prescribed  Past Medical History:  Diagnosis Date  . Acute bronchitis 05/16/2012  . Allergic state 05/16/2012  . Allergy   . Constipation 04/16/2014  . Diabetes mellitus without complication ([place])   . DM2 (diabetes mellitus, type 2) ([place]) 08/10/2015  . GERD (gastroesophageal reflux disease) 06/12/2013  . Hip pain, bilateral 08/19/2015  . HTN (hypertension)   . Hyperglycemia 08/10/2015  . Hyperlipidemia   . Hypokalemia 11/14/2012  . Overweight 08/14/2016  . Seasonal allergies     Past Surgical History:  Procedure Laterality Date  . CESAREAN SECTION    . HEMORRHOID SURGERY     two    Family History  Problem Relation Age of Onset  . Heart disease Father        Deceased  . Hypertension Father   . Lung cancer Father   . Melanoma Father        Eye Removal  . Hyperlipidemia Mother   . Hypertension Mother   . Colon polyps Mother   . Hyperlipidemia Maternal Grandmother   . Diabetes Maternal Grandmother   . Dementia Maternal Grandmother   . Hypertension Sister        x2  . Colon polyps Sister   . Hyperlipidemia Sister        x2  . Colon polyps Sister   . Colon cancer Neg Hx   . Esophageal cancer Neg Hx   . Rectal cancer Neg Hx   . Stomach cancer Neg Hx     Social History   Socioeconomic History  . Marital status: Married    Spouse name: Not on file  . Number of children: Not on file  . Years of education: Not on file  . Highest education level: Not on file  Occupational  History  . Not on file  Social Needs  . Financial resource strain: Not on file  . Food insecurity:    Worry: Not on file    Inability: Not on file  . Transportation needs:    Medical: Not on file    Non-medical: Not on file  Tobacco Use  . Smoking status: Never Smoker  . Smokeless tobacco: Never Used  Substance and Sexual Activity  . Alcohol use: No  . Drug use: No  . 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 Physical activity:    Days per week: Not on file    Minutes per session: Not on file  . Stress: Not on file  Relationships  . Social connections:    Talks on phone: Not on file    Gets together: Not on file    Attends religious service: Not on file    Active member of club or organization: Not on file    Attends meetings of clubs or organizations: Not on file    Relationship status: Not on file  . Intimate partner violence:  Fear of current or ex partner: Not on file    Emotionally abused: Not on file    Physically abused: Not on file    Forced sexual activity: Not on file  Other Topics Concern  . Not on file  Social History Narrative  . Not on file    Outpatient Medications Prior to Visit  Medication Sig Dispense Refill  . blood glucose meter kit and supplies KIT Dispense based on patient and insurance preference. Check BS  daily as directed.  DzE11.9 1 each 0  . cetirizine (ZYRTEC) 10 MG tablet Take 10 mg by mouth daily as needed.    . ezetimibe (ZETIA) 10 MG tablet Take 1 tablet (10 mg total) by mouth daily. 90 tablet 2  . fluticasone (FLONASE) 50 MCG/ACT nasal spray Place 2 sprays into the nose daily as needed.    Marland Kitchen lisinopril (PRINIVIL,ZESTRIL) 10 MG tablet Take 1 tablet (10 mg total) by mouth daily. 90 tablet 1  . metFORMIN (GLUCOPHAGE) 500 MG tablet Take 1 tablet (500 mg total) by mouth 2 (two) times daily with a meal. 180 tablet 1  . metoprolol  succinate (TOPROL-XL) 50 MG 24 hr tablet 1 1/2 tab po qam and 1 po q pm Take with or immediately following a meal. 180 tablet 2  . norgestrel-ethinyl estradiol (LO/OVRAL,CRYSELLE) 0.3-30 MG-MCG tablet Take 1 tablet by mouth daily. 1 Package 0  . omeprazole (PRILOSEC) 20 MG capsule Take 20 mg by mouth daily.    . ONE TOUCH ULTRA TEST test strip   0  . ONETOUCH DELICA LANCETS 27X MISC   0  . rosuvastatin (CRESTOR) 40 MG tablet Take 1 tablet (40 mg total) by mouth daily. 90 tablet 1  . triamterene-hydrochlorothiazide (MAXZIDE-25) 37.5-25 MG tablet Take 1 tablet by mouth daily. 90 tablet 1   No facility-administered medications prior to visit.     Allergies  Allergen Reactions  . Doxycycline Monohydrate     GI Issues.  . Niacin And Related     Extreme flushing despite taking it correctly  . Tetracyclines & Related Other (See Comments)    GI Issues.    Review of Systems  Constitutional: Negative for fever and malaise/fatigue.  HENT: Negative for congestion.   Eyes: Negative for blurred vision.  Respiratory: Positive for cough and shortness of breath.   Cardiovascular: Negative for chest pain, palpitations and leg swelling.  Gastrointestinal: Negative for abdominal pain, blood in stool and nausea.  Genitourinary: Negative for dysuria and frequency.  Musculoskeletal: Negative for falls.  Skin: Negative for rash.  Neurological: Positive for dizziness. Negative for loss of consciousness and headaches.  Endo/Heme/Allergies: Negative for environmental allergies.  Psychiatric/Behavioral: Negative for depression. The patient is not nervous/anxious.        Objective:    Physical Exam  Constitutional: She is oriented to person, place, and time. She appears well-developed and well-nourished. No distress.  HENT:  Head: Normocephalic and atraumatic.  Nose: Nose normal.  Eyes: Right eye exhibits no discharge. Left eye exhibits no discharge.  Neck: Normal range of motion. Neck supple.    Cardiovascular: Normal rate and regular rhythm.  No murmur heard. Pulmonary/Chest: Effort normal and breath sounds normal.  Abdominal: Soft. Bowel sounds are normal. There is no tenderness.  Musculoskeletal: She exhibits no edema.  Neurological: She is alert and oriented to person, place, and time.  Skin: Skin is warm and dry.  Psychiatric: She has a normal mood and affect.  Nursing note and vitals reviewed.   BP  108/66 (BP Location: Left Arm, Patient Position: Sitting, Cuff Size: Normal)   Pulse 84   Temp 98.5 F (36.9 C) (Oral)   Resp 18   Wt 146 lb (66.2 kg)   SpO2 98%   BMI 25.46 kg/m  Wt Readings from Last 3 Encounters:  01/04/18 146 lb (66.2 kg)  12/24/17 145 lb (65.8 kg)  12/10/17 145 lb 3.2 oz (65.9 kg)     Lab Results  Component Value Date   WBC 7.0 12/07/2017   HGB 12.9 12/07/2017   HCT 37.0 12/07/2017   PLT 276.0 12/07/2017   GLUCOSE 141 (H) 12/24/2017   CHOL 213 (H) 10/06/2017   TRIG 274.0 (H) 10/06/2017   HDL 36.00 (L) 10/06/2017   LDLDIRECT 145.0 10/06/2017   LDLCALC 107 (H) 06/03/2017   ALT 15 12/24/2017   AST 20 12/24/2017   NA 134 (L) 12/24/2017   K 5.0 12/24/2017   CL 101 12/24/2017   CREATININE 1.19 12/24/2017   BUN 22 12/24/2017   CO2 25 12/24/2017   TSH 2.73 12/07/2017   HGBA1C 7.2 (H) 10/06/2017    Lab Results  Component Value Date   TSH 2.73 12/07/2017   Lab Results  Component Value Date   WBC 7.0 12/07/2017   HGB 12.9 12/07/2017   HCT 37.0 12/07/2017   MCV 87.6 12/07/2017   PLT 276.0 12/07/2017   Lab Results  Component Value Date   NA 134 (L) 12/24/2017   K 5.0 12/24/2017   CO2 25 12/24/2017   GLUCOSE 141 (H) 12/24/2017   BUN 22 12/24/2017   CREATININE 1.19 12/24/2017   BILITOT 1.0 12/24/2017   ALKPHOS 48 12/24/2017   AST 20 12/24/2017   ALT 15 12/24/2017   PROT 7.4 12/24/2017   ALBUMIN 4.2 12/24/2017   CALCIUM 10.9 (H) 12/24/2017   GFR 50.52 (L) 12/24/2017   Lab Results  Component Value Date   CHOL 213 (H)  10/06/2017   Lab Results  Component Value Date   HDL 36.00 (L) 10/06/2017   Lab Results  Component Value Date   LDLCALC 107 (H) 06/03/2017   Lab Results  Component Value Date   TRIG 274.0 (H) 10/06/2017   Lab Results  Component Value Date   CHOLHDL 6 10/06/2017   Lab Results  Component Value Date   HGBA1C 7.2 (H) 10/06/2017       Assessment & Plan:   Problem List Items Addressed This Visit    Hypertension, benign essential, goal below 140/90    Well controlled, no changes to meds. Encouraged heart healthy diet such as the DASH diet and exercise as tolerated.       Hypercholesterolemia    Encouraged heart healthy diet, increase exercise, avoid trans fats, consider a krill oil cap daily      DM2 (diabetes mellitus, type 2) ([facility])    hgba1c acceptable, minimize simple carbs. Increase exercise as tolerated.       Dizziness    Much better but still gets light headed when gets up too quickly reminded to hydrate and arise more slowly      Dyspnea    Gets winded even when doing laundry check echo      Relevant Orders   ECHOCARDIOGRAM COMPLETE   Hypercalcemia - Primary    Check cmp and pth      Relevant Orders   Comprehensive metabolic panel   PTH, Intact and Calcium      I am having Genessis B. Messler maintain her fluticasone, cetirizine, norgestrel-ethinyl estradiol,  omeprazole, ezetimibe, metFORMIN, rosuvastatin, blood glucose meter kit and supplies, ONE TOUCH ULTRA TEST, ONETOUCH DELICA LANCETS 22Q, metoprolol succinate, triamterene-hydrochlorothiazide, and lisinopril.  No orders of the defined types were placed in this encounter.    Penni Homans, MD

## 2018-01-04 NOTE — Assessment & Plan Note (Signed)
Check cmp and pth 

## 2018-01-04 NOTE — Assessment & Plan Note (Signed)
Gets winded even when doing laundry check echo

## 2018-01-06 LAB — PTH, INTACT AND CALCIUM
CALCIUM: 9.2 mg/dL (ref 8.6–10.4)
PTH: 36 pg/mL (ref 14–64)

## 2018-01-07 NOTE — Telephone Encounter (Signed)
Copied from CRM [id number]. Topic: General - Other >> Jan 06, 2018 10:20 AM Ronney Lion A wrote: Reason for CRM: Pt called in, says she would like someone to give her a call back regarding her lab results, also she wanted to follow up about the referral request she discussed with the provider, she says she hasn't gotten a call about that as far as scheduling an appt.   Please advise.

## 2018-01-07 NOTE — Telephone Encounter (Signed)
I referred her to gastroenterology on 9/10 and ordered the echo on 10/21. Please confirm these are moving forward and make sure she gets appointments

## 2018-01-07 NOTE — Telephone Encounter (Signed)
Spoke with patient she stated she was supposed to have an Echocardiogram, I did not see where there was one placed. Please advise

## 2018-01-11 NOTE — Telephone Encounter (Signed)
This is an example of what Carollee Herter was telling us about on Friday there are being CRM's being created and once the Provider places orders they are going to a black whole. Not even in her chart that the order for the Echo was placed. It was brought to [facility] attention she found it and has not completed the referral.

## 2018-01-11 NOTE — Telephone Encounter (Signed)
I wondered. For echos if you look for the tab CV procedures on this end you can see when it was ordered but does not help for the people who need to see it to schedule it.

## 2018-01-11 NOTE — Telephone Encounter (Signed)
Pt is scheduled °

## 2018-01-22 ENCOUNTER — Ambulatory Visit (HOSPITAL_BASED_OUTPATIENT_CLINIC_OR_DEPARTMENT_OTHER)
Admission: RE | Admit: 2018-01-22 | Discharge: 2018-01-22 | Disposition: A | Discharge status: Home / Self Care | Payer: BLUE CROSS/BLUE SHIELD | Source: Ambulatory Visit | Attending: Family Medicine | Admitting: Family Medicine

## 2018-01-22 DIAGNOSIS — R0600 Dyspnea, unspecified: Secondary | ICD-10-CM | POA: Insufficient documentation

## 2018-01-22 NOTE — Progress Notes (Signed)
  Echocardiogram 2D Echocardiogram has been performed.  Marissa Guzman 01/22/2018, 11:20 AM

## 2018-01-26 MED FILL — ELINEST-28 TABLET: 0.3-30 | 84 days supply | Qty: 84 | Fill #0

## 2018-02-23 ENCOUNTER — Other Ambulatory Visit: Payer: Self-pay | Admitting: Cardiology

## 2018-02-23 DIAGNOSIS — R079 Chest pain, unspecified: Secondary | ICD-10-CM

## 2018-02-23 DIAGNOSIS — I10 Essential (primary) hypertension: Secondary | ICD-10-CM

## 2018-02-23 DIAGNOSIS — E782 Mixed hyperlipidemia: Secondary | ICD-10-CM

## 2018-03-23 MED FILL — METOPROLOL SUCCINATE ER 50: 50 | 72 days supply | Qty: 180 | Fill #2

## 2018-03-24 ENCOUNTER — Encounter: Payer: Self-pay | Admitting: Family Medicine

## 2018-03-24 ENCOUNTER — Ambulatory Visit: Payer: BLUE CROSS/BLUE SHIELD | Admitting: Family Medicine

## 2018-03-24 VITALS — BP 122/82 | HR 84 | Temp 98.2°F | Resp 16 | Ht 63.0 in | Wt 145.0 lb

## 2018-03-24 DIAGNOSIS — R059 Cough, unspecified: Secondary | ICD-10-CM

## 2018-03-24 DIAGNOSIS — R05 Cough: Secondary | ICD-10-CM

## 2018-03-24 DIAGNOSIS — J209 Acute bronchitis, unspecified: Secondary | ICD-10-CM | POA: Diagnosis not present

## 2018-03-24 MED ORDER — AZITHROMYCIN 250 MG PO TABS: ORAL_TABLET | ORAL | 0 refills | Status: DC

## 2018-03-24 MED ORDER — HYDROCODONE-HOMATROPINE 5-1.5 MG/5ML PO SYRP: 5.0000 mL | ORAL_SOLUTION | Freq: Three times a day (TID) | ORAL | 0 refills | Status: AC | PRN

## 2018-03-24 MED FILL — AZITHROMYCIN 250 MG TABLET: 250 | 5 days supply | Qty: 6 | Fill #0

## 2018-03-24 MED FILL — HYDROCODONE-HOMATROPINE SOL: 5-1.5 | 6 days supply | Qty: 90 | Fill #0

## 2018-03-24 NOTE — Patient Instructions (Signed)
It was good to see you today, but I am sorry you are not feeling well. We will treat you with azithromycin, and antibiotic for your bronchitis. I also sent in a prescription for Hycodan cough syrup to use as needed for cough.  This will make you drowsy, so do not use it when you to drive  Please let us know if you are not feeling better in the next few days, sooner if you are getting

## 2018-03-24 NOTE — Progress Notes (Signed)
[facility] [place], [place], [place] [address] [phone]  Date:  03/24/2018   Name:  Marissa Guzman   DOB:  [birth year]   MRN:  [id number]  PCP:  [name], MD    Chief Complaint: Cough (one week, started with sore throat, coughing-productive at times, no fever taking advil no other otc meds tried)   History of Present Illness:  Marissa Guzman is a 53 y.o. very pleasant female patient who presents with the following:  Patient of Dr. Randel Pigg here today with acute illness History of diabetes, renal insufficiency, GERD, hyperlipidemia  Ill for about one week now She notes that her sx started with a ST, and now she has a cough  Her sinuses are starting to feel full The cough can be productive No fever She does not feel like she has the flu Throat is still a bit sore, ?due to cough  No vomiting or diarrhea Her husband has had the same sort of illness   She has used some advil OTC Her most bothersome sx is the cough   Lab Results  Component Value Date   HGBA1C 7.2 (H) 10/06/2017    Her home glucose may run 130- 200  Review controlled substance database, no entries found Patient Active Problem List   Diagnosis Date Noted  . Dyspnea 01/04/2018  . Hypercalcemia 01/04/2018  . Renal insufficiency 12/24/2017  . Edema 12/13/2017  . Dizziness 12/07/2017  . Myalgia 10/11/2017  . Overweight 08/14/2016  . Hip pain, bilateral 08/19/2015  . DM2 (diabetes mellitus, type 2) ([place]) 08/10/2015  . Constipation 04/16/2014  . GERD (gastroesophageal reflux disease) 06/12/2013  . Hypokalemia 11/14/2012  . Allergic state 05/16/2012  . Preventative health care 05/11/2012  . Hypertension, benign essential, goal below 140/90 10/25/2011  . Hypercholesterolemia 10/25/2011    Past Medical History:  Diagnosis Date  . Acute bronchitis 05/16/2012  . Allergic state 05/16/2012  . Allergy   . Constipation 04/16/2014  .  Diabetes mellitus without complication ([place])   . DM2 (diabetes mellitus, type 2) ([place]) 08/10/2015  . GERD (gastroesophageal reflux disease) 06/12/2013  . Hip pain, bilateral 08/19/2015  . HTN (hypertension)   . Hyperglycemia 08/10/2015  . Hyperlipidemia   . Hypokalemia 11/14/2012  . Overweight 08/14/2016  . Seasonal allergies     Past Surgical History:  Procedure Laterality Date  . CESAREAN SECTION    . HEMORRHOID SURGERY     two    Social History   Tobacco Use  . Smoking status: Never Smoker  . Smokeless tobacco: Never Used  Substance Use Topics  . Alcohol use: No  . Drug use: No    Family History  Problem Relation Age of Onset  . Heart disease Father        Deceased  . Hypertension Father   . Lung cancer Father   . Melanoma Father        Eye Removal  . Hyperlipidemia Mother   . Hypertension Mother   . Colon polyps Mother   . Hyperlipidemia Maternal Grandmother   . Diabetes Maternal Grandmother   . Dementia Maternal Grandmother   . Hypertension Sister        x2  . Colon polyps Sister   . Hyperlipidemia Sister        x2  . Colon polyps Sister   . Colon cancer Neg Hx   . Esophageal cancer Neg Hx   .  Rectal cancer Neg Hx   . Stomach cancer Neg Hx     Allergies  Allergen Reactions  . Doxycycline Monohydrate     GI Issues.  . Niacin And Related     Extreme flushing despite taking it correctly  . Tetracyclines & Related Other (See Comments)    GI Issues.    Medication list has been reviewed and updated.  Current Outpatient Medications on File Prior to Visit  Medication Sig Dispense Refill  . blood glucose meter kit and supplies KIT Dispense based on patient and insurance preference. Check BS  daily as directed.  DzE11.9 1 each 0  . cetirizine (ZYRTEC) 10 MG tablet Take 10 mg by mouth daily as needed.    . ezetimibe (ZETIA) 10 MG tablet TAKE 1 TABLET (10 MG TOTAL) BY MOUTH DAILY. 30 tablet 0  . fluticasone (FLONASE) 50 MCG/ACT nasal spray Place 2 sprays into  the nose daily as needed.    Marland Kitchen lisinopril (PRINIVIL,ZESTRIL) 10 MG tablet Take 1 tablet (10 mg total) by mouth daily. 90 tablet 1  . metFORMIN (GLUCOPHAGE) 500 MG tablet Take 1 tablet (500 mg total) by mouth 2 (two) times daily with a meal. 180 tablet 1  . metoprolol succinate (TOPROL-XL) 50 MG 24 hr tablet 1 1/2 tab po qam and 1 po q pm Take with or immediately following a meal. 180 tablet 2  . norgestrel-ethinyl estradiol (LO/OVRAL,CRYSELLE) 0.3-30 MG-MCG tablet Take 1 tablet by mouth daily. 1 Package 0  . omeprazole (PRILOSEC) 20 MG capsule Take 20 mg by mouth daily.    . ONE TOUCH ULTRA TEST test strip   0  . ONETOUCH DELICA LANCETS 62U MISC   0  . rosuvastatin (CRESTOR) 40 MG tablet Take 1 tablet (40 mg total) by mouth daily. 90 tablet 1  . triamterene-hydrochlorothiazide (MAXZIDE-25) 37.5-25 MG tablet Take 1 tablet by mouth daily. 90 tablet 1   No current facility-administered medications on file prior to visit.     Review of Systems:  As per HPI- otherwise negative. No rash, no vomiting  Physical Examination: Vitals:   03/24/18 1450  BP: 122/82  Pulse: 84  Resp: 16  Temp: 98.2 F (36.8 C)  SpO2: 95%   Vitals:   03/24/18 1450  Weight: 145 lb (65.8 kg)  Height: '5\' 3"'  (1.6 m)   Body mass index is 25.69 kg/m. Ideal Body Weight: Weight in (lb) to have BMI = 25: 140.8  GEN: WDWN, NAD, Non-toxic, A & O x 3, normal weight.  Appears to have upper respiratory infection HEENT: Atraumatic, Normocephalic. Neck supple. No masses, No LAD.  Bilateral TM wnl, oropharynx normal.  PEERL,EOMI. status post tonsillectomy Ears and Nose: No external deformity. CV: RRR, No M/G/R. No JVD. No thrill. No extra heart sounds. PULM: Minimal congestion heard in the right lung base, no wheezing. No retractions. No resp. distress. No accessory muscle use. ABD: S, NT, ND EXTR: No c/c/e NEURO Normal gait.  PSYCH: Normally interactive. Conversant. Not depressed or anxious appearing.  Calm demeanor.     Assessment and Plan: Cough - Plan: HYDROcodone-homatropine (HYCODAN) 5-1.5 MG/5ML syrup  Acute bronchitis, unspecified organism - Plan: azithromycin (ZITHROMAX) 250 MG tablet  Here today with illness about 1 week, with cough.  Suspicion for bronchitis. We will treat azithromycin, also Hycodan as needed for cough.  Advised her that this medication may cause sedation, she should not use when she is driving or combine with any other sedating medication. Patient states understanding, has no further  questions.  She will let us know if any other concerns  Signed Lamar Blinks, MD

## 2018-03-29 DIAGNOSIS — H43811 Vitreous degeneration, right eye: Secondary | ICD-10-CM | POA: Diagnosis not present

## 2018-03-29 DIAGNOSIS — H1851 Endothelial corneal dystrophy: Secondary | ICD-10-CM | POA: Diagnosis not present

## 2018-03-29 DIAGNOSIS — E119 Type 2 diabetes mellitus without complications: Secondary | ICD-10-CM | POA: Diagnosis not present

## 2018-03-29 DIAGNOSIS — H40013 Open angle with borderline findings, low risk, bilateral: Secondary | ICD-10-CM | POA: Diagnosis not present

## 2018-03-29 LAB — HM DIABETES EYE EXAM

## 2018-04-06 ENCOUNTER — Other Ambulatory Visit: Payer: Self-pay | Admitting: Family Medicine

## 2018-04-06 MED FILL — TRIAMTERENE-HCTZ 37.5-25 MG: 37.5-25 | 90 days supply | Qty: 90 | Fill #1

## 2018-04-06 MED FILL — ROSUVASTATIN CALCIUM 40 MG: 40 | 90 days supply | Qty: 90 | Fill #0

## 2018-04-06 MED FILL — LISINOPRIL 10 MG TABLET: 10 | 90 days supply | Qty: 90 | Fill #1

## 2018-04-06 MED FILL — metFORMIN HCL 500 MG TABS: 500 | 90 days supply | Qty: 180 | Fill #0

## 2018-04-08 ENCOUNTER — Telehealth: Payer: Self-pay | Admitting: *Deleted

## 2018-04-08 DIAGNOSIS — E785 Hyperlipidemia, unspecified: Secondary | ICD-10-CM

## 2018-04-08 DIAGNOSIS — R739 Hyperglycemia, unspecified: Secondary | ICD-10-CM

## 2018-04-08 DIAGNOSIS — E119 Type 2 diabetes mellitus without complications: Secondary | ICD-10-CM

## 2018-04-08 DIAGNOSIS — I10 Essential (primary) hypertension: Secondary | ICD-10-CM

## 2018-04-08 NOTE — Telephone Encounter (Signed)
Pt is scheduled for lab appointment on 04/12/18 but I do not see any future orders in Epic. Can you place orders if appropriate or advise if lab appt still needed? Pt has follow up with PCP on 04/27/18.

## 2018-04-08 NOTE — Telephone Encounter (Signed)
Needs cmp, cbc, tsh, lipid, hgba1c for DM, htn and hyperlipidemia.

## 2018-04-09 NOTE — Telephone Encounter (Signed)
Orders have been placed.

## 2018-04-12 ENCOUNTER — Other Ambulatory Visit (INDEPENDENT_AMBULATORY_CARE_PROVIDER_SITE_OTHER): Payer: BLUE CROSS/BLUE SHIELD

## 2018-04-12 DIAGNOSIS — I10 Essential (primary) hypertension: Secondary | ICD-10-CM | POA: Diagnosis not present

## 2018-04-12 DIAGNOSIS — E785 Hyperlipidemia, unspecified: Secondary | ICD-10-CM | POA: Diagnosis not present

## 2018-04-12 DIAGNOSIS — R739 Hyperglycemia, unspecified: Secondary | ICD-10-CM | POA: Diagnosis not present

## 2018-04-12 DIAGNOSIS — D649 Anemia, unspecified: Secondary | ICD-10-CM

## 2018-04-12 LAB — HEMOGLOBIN A1C: Hgb A1c MFr Bld: 7.7 % — ABNORMAL HIGH (ref 4.6–6.5)

## 2018-04-12 LAB — COMPREHENSIVE METABOLIC PANEL
ALT: 10 U/L (ref 0–35)
AST: 14 U/L (ref 0–37)
Albumin: 3.8 g/dL (ref 3.5–5.2)
Alkaline Phosphatase: 41 U/L (ref 39–117)
BUN: 12 mg/dL (ref 6–23)
CHLORIDE: 106 meq/L (ref 96–112)
CO2: 22 meq/L (ref 19–32)
Calcium: 9 mg/dL (ref 8.4–10.5)
Creatinine, Ser: 1.39 mg/dL — ABNORMAL HIGH (ref 0.40–1.20)
GFR: 39.68 mL/min — ABNORMAL LOW (ref >60.00)
Glucose, Bld: 125 mg/dL — ABNORMAL HIGH (ref 70–99)
Potassium: 3.9 mEq/L (ref 3.5–5.1)
Sodium: 139 mEq/L (ref 135–145)
Total Bilirubin: 0.5 mg/dL (ref 0.2–1.2)
Total Protein: 6.4 g/dL (ref 6.0–8.3)

## 2018-04-12 LAB — CBC
HEMATOCRIT: 29.6 % — AB (ref 36.0–46.0)
HEMOGLOBIN: 10 g/dL — AB (ref 12.0–15.0)
MCHC: 33.9 g/dL (ref 30.0–36.0)
MCV: 90.3 fl (ref 78.0–100.0)
Platelets: 250 10*3/uL (ref 150.0–400.0)
RBC: 3.28 Mil/uL — ABNORMAL LOW (ref 3.87–5.11)
RDW: 14.2 % (ref 11.5–15.5)
WBC: 6.4 10*3/uL (ref 4.0–10.5)

## 2018-04-12 LAB — LIPID PANEL
Cholesterol: 231 mg/dL — ABNORMAL HIGH (ref 0–200)
HDL: 30.9 mg/dL — ABNORMAL LOW (ref >39.00)
NonHDL: 200.09
Total CHOL/HDL Ratio: 7
Triglycerides: 346 mg/dL — ABNORMAL HIGH (ref 0.0–149.0)
VLDL: 69.2 mg/dL — ABNORMAL HIGH (ref 0.0–40.0)

## 2018-04-12 LAB — LDL CHOLESTEROL, DIRECT: Direct LDL: 133 mg/dL

## 2018-04-12 LAB — TSH: TSH: 2.18 u[IU]/mL (ref 0.35–4.50)

## 2018-04-12 MED FILL — ELINEST-28 TABLET: 0.3-30 | 84 days supply | Qty: 84 | Fill #1

## 2018-04-14 NOTE — Addendum Note (Signed)
Addended by: Conrad [place] D on: 04/14/2018 11:00 AM   Modules accepted: Orders

## 2018-04-15 ENCOUNTER — Ambulatory Visit: Payer: BLUE CROSS/BLUE SHIELD | Admitting: Family Medicine

## 2018-04-27 ENCOUNTER — Ambulatory Visit (INDEPENDENT_AMBULATORY_CARE_PROVIDER_SITE_OTHER): Payer: BLUE CROSS/BLUE SHIELD | Admitting: Family Medicine

## 2018-04-27 ENCOUNTER — Encounter: Payer: Self-pay | Admitting: Gastroenterology

## 2018-04-27 ENCOUNTER — Encounter: Payer: Self-pay | Admitting: Family Medicine

## 2018-04-27 VITALS — BP 98/72 | HR 105 | Temp 98.3°F | Resp 18 | Ht 63.0 in | Wt 140.6 lb

## 2018-04-27 DIAGNOSIS — N289 Disorder of kidney and ureter, unspecified: Secondary | ICD-10-CM | POA: Diagnosis not present

## 2018-04-27 DIAGNOSIS — E119 Type 2 diabetes mellitus without complications: Secondary | ICD-10-CM | POA: Diagnosis not present

## 2018-04-27 DIAGNOSIS — E663 Overweight: Secondary | ICD-10-CM

## 2018-04-27 DIAGNOSIS — Z1211 Encounter for screening for malignant neoplasm of colon: Secondary | ICD-10-CM | POA: Diagnosis not present

## 2018-04-27 DIAGNOSIS — E7800 Pure hypercholesterolemia, unspecified: Secondary | ICD-10-CM

## 2018-04-27 DIAGNOSIS — D649 Anemia, unspecified: Secondary | ICD-10-CM

## 2018-04-27 DIAGNOSIS — I10 Essential (primary) hypertension: Secondary | ICD-10-CM

## 2018-04-27 MED ORDER — METFORMIN HCL 500 MG PO TABS: 500.0000 mg | ORAL_TABLET | Freq: Two times a day (BID) | ORAL | 1 refills | Status: DC

## 2018-04-27 MED ORDER — LISINOPRIL 10 MG PO TABS: 5.0000 mg | ORAL_TABLET | Freq: Every day | ORAL | 1 refills | Status: DC

## 2018-04-27 MED ORDER — PITAVASTATIN CALCIUM 4 MG PO TABS: 4.0000 mg | ORAL_TABLET | Freq: Every day | ORAL | 1 refills | Status: DC

## 2018-04-27 NOTE — Patient Instructions (Addendum)
Fasting sugars 70-130 After eating 100-150 If sugar goes low can drop the Metformin down to 3 tabs a day, 1 tab 3 x daily  GoodRx and CoverMyMeds for med coupons  Hypoglycemia Hypoglycemia occurs when the level of sugar (glucose) in the blood is too low. Hypoglycemia can happen in people who do or do not have diabetes. It can develop quickly, and it can be a medical emergency. For most people with diabetes, a blood glucose level below 70 mg/dL (3.9 mmol/L) is considered hypoglycemia. Glucose is a type of sugar that provides the body's main source of energy. Certain hormones (insulin and glucagon) control the level of glucose in the blood. Insulin lowers blood glucose, and glucagon raises blood glucose. Hypoglycemia can result from having too much insulin in the bloodstream, or from not eating enough food that contains glucose. You may also have reactive hypoglycemia, which happens within 4 hours after eating a meal. What are the causes? Hypoglycemia occurs most often in people who have diabetes and may be caused by:  Diabetes medicine.  Not eating enough, or not eating often enough.  Increased physical activity.  Drinking alcohol on an empty stomach. If you do not have diabetes, hypoglycemia may be caused by:  A tumor in the pancreas.  Not eating enough, or not eating for long periods at a time (fasting).  A severe infection or illness.  Certain medicines. What increases the risk? Hypoglycemia is more likely to develop in:  People who have diabetes and take medicines to lower blood glucose.  People who abuse alcohol.  People who have a severe illness. What are the signs or symptoms? Mild symptoms Mild hypoglycemia may not cause any symptoms. If you do have symptoms, they may include:  Hunger.  Anxiety.  Sweating and feeling clammy.  Dizziness or feeling light-headed.  Sleepiness.  Nausea.  Increased heart rate.  Headache.  Blurry  vision.  Irritability.  Tingling or numbness around the mouth, lips, or tongue.  A change in coordination.  Restless sleep. Moderate symptoms Moderate hypoglycemia can cause:  Mental confusion and poor judgment.  Behavior changes.  Weakness.  Irregular heartbeat. Severe symptoms Severe hypoglycemia is a medical emergency. It can cause:  Fainting.  Seizures.  Loss of consciousness (coma).  Death. How is this diagnosed? Hypoglycemia is diagnosed with a blood test to measure your blood glucose level. This blood test is done while you are having symptoms. Your health care provider may also do a physical exam and review your medical history. How is this treated? This condition can often be treated by immediately eating or drinking something that contains sugar, such as:  Fruit juice, 4-6 oz (120-150 mL).  Regular soda (not diet soda), 4-6 oz (120-150 mL).  Low-fat milk, 4 oz (120 mL).  Several pieces of hard candy.  Sugar or honey, 1 Tbsp (15 mL). Treating hypoglycemia if you have diabetes If you are alert and able to swallow safely, follow the 15:15 rule:  Take 15 grams of a rapid-acting carbohydrate. Talk with your health care provider about how much you should take.  Rapid-acting options include: ? Glucose pills (take 15 grams). ? 6-8 pieces of hard candy. ? 4-6 oz (120-150 mL) of fruit juice. ? 4-6 oz (120-150 mL) of regular (not diet) soda. ? 1 Tbsp (15 mL) honey or sugar.  Check your blood glucose 15 minutes after you take the carbohydrate.  If the repeat blood glucose level is still at or below 70 mg/dL (3.9 mmol/L), take 15  grams of a carbohydrate again.  If your blood glucose level does not increase above 70 mg/dL (3.9 mmol/L) after 3 tries, seek emergency medical care.  After your blood glucose level returns to normal, eat a meal or a snack within 1 hour.  Treating severe hypoglycemia Severe hypoglycemia is when your blood glucose level is at or  below 54 mg/dL (3 mmol/L). Severe hypoglycemia is a medical emergency. Get medical help right away. If you have severe hypoglycemia and you cannot eat or drink, you may need an injection of glucagon. A family member or close friend should learn how to check your blood glucose and how to give you a glucagon injection. Ask your health care provider if you need to have an emergency glucagon injection kit available. Severe hypoglycemia may need to be treated in a hospital. The treatment may include getting glucose through an IV. You may also need treatment for the cause of your hypoglycemia. Follow these instructions at home:  General instructions  Take over-the-counter and prescription medicines only as told by your health care provider.  Monitor your blood glucose as told by your health care provider.  Limit alcohol intake to no more than 1 drink a day for nonpregnant women and 2 drinks a day for men. One drink equals 12 oz of beer (355 mL), 5 oz of wine (148 mL), or 1 oz of hard liquor (44 mL).  Keep all follow-up visits as told by your health care provider. This is important. If you have diabetes:  Always have a rapid-acting carbohydrate snack with you to treat low blood glucose.  Follow your diabetes management plan as directed. Make sure you: ? Know the symptoms of hypoglycemia. It is important to treat it right away to prevent it from becoming severe. ? Take your medicines as directed. ? Follow your exercise plan. ? Follow your meal plan. Eat on time, and do not skip meals. ? Check your blood glucose as often as directed. Always check before and after exercise. ? Follow your sick day plan whenever you cannot eat or drink normally. Make this plan in advance with your health care provider.  Share your diabetes management plan with people in your workplace, school, and household.  Check your urine for ketones when you are ill and as told by your health care provider.  Carry a medical  alert card or wear medical alert jewelry. Contact a health care provider if:  You have problems keeping your blood glucose in your target range.  You have frequent episodes of hypoglycemia. Get help right away if:  You continue to have hypoglycemia symptoms after eating or drinking something containing glucose.  Your blood glucose is at or below 54 mg/dL (3 mmol/L).  You have a seizure.  You faint. These symptoms may represent a serious problem that is an emergency. Do not wait to see if the symptoms will go away. Get medical help right away. Call your local emergency services (911 in the [place] Summary  Hypoglycemia occurs when the level of sugar (glucose) in the blood is too low.  Hypoglycemia can happen in people who do or do not have diabetes. It can develop quickly, and it can be a medical emergency.  Make sure you know the symptoms of hypoglycemia and how to treat it.  Always have a rapid-acting carbohydrate snack with you to treat low blood sugar. This information is not intended to replace advice given to you by your health care provider. Make sure you discuss any  questions you have with your health care provider. Document Released: 03/03/2005 Document Revised: 08/25/2017 Document Reviewed: 04/06/2015 Elsevier Interactive Patient Education  2019 [employer].

## 2018-04-27 NOTE — Assessment & Plan Note (Signed)
Overtreated. Will drop the Lisinopril to 5 mg dailyu

## 2018-04-27 NOTE — Assessment & Plan Note (Signed)
Increase Metformin to 1000 mg po bid

## 2018-04-27 NOTE — Assessment & Plan Note (Signed)
Good weight loss since last visit. No changes

## 2018-04-27 NOTE — Assessment & Plan Note (Signed)
Hydrate and monitor 

## 2018-04-27 NOTE — Assessment & Plan Note (Signed)
Was taking her Rosuvastatin 40 mg daily and was still too high on Atorvastatin 80 mg is given a prescription of Livalo 4 mg to see if her insurance cover. If they will not will go back to Atorvastatin 80 mg

## 2018-04-28 ENCOUNTER — Telehealth: Payer: Self-pay | Admitting: Family Medicine

## 2018-04-28 ENCOUNTER — Encounter: Payer: Self-pay | Admitting: Gastroenterology

## 2018-04-28 ENCOUNTER — Ambulatory Visit: Payer: BLUE CROSS/BLUE SHIELD | Admitting: Gastroenterology

## 2018-04-28 LAB — FERRITIN: Ferritin: 152.4 ng/mL (ref 10.0–291.0)

## 2018-04-28 LAB — VITAMIN B12: Vitamin B-12: 210 pg/mL — ABNORMAL LOW (ref 211–911)

## 2018-04-28 NOTE — Progress Notes (Signed)
Subjective:    Patient ID: Marissa Guzman, female    DOB: 06/09/1965, 53 y.o.   MRN: [id number]  No chief complaint on file.   HPI Patient is in today for follow up. She had a recent episode of bronchitis but was treated with azithromycin and has recovered.  She reports her cough and congestion have resolved.  Her sugars have been running high sometimes over 200.  She says she can always tell when her sugar is high as her mouth taste sweet.  No polyuria or polydipsia.  No recent hospitalization.  She is trying to maintain a heartedly diet and has been exercising more as a result is pleased with her weight loss. Denies CP/palp/SOB/HA/congestion/fevers/GI or GU c/o. Taking meds as prescribed  Past Medical History:  Diagnosis Date  . Acute bronchitis 05/16/2012  . Allergic state 05/16/2012  . Allergy   . Constipation 04/16/2014  . Diabetes mellitus without complication ([place])   . DM2 (diabetes mellitus, type 2) ([place]) 08/10/2015  . GERD (gastroesophageal reflux disease) 06/12/2013  . Hip pain, bilateral 08/19/2015  . HTN (hypertension)   . Hyperglycemia 08/10/2015  . Hyperlipidemia   . Hypokalemia 11/14/2012  . Overweight 08/14/2016  . Seasonal allergies     Past Surgical History:  Procedure Laterality Date  . CESAREAN SECTION    . HEMORRHOID SURGERY     two    Family History  Problem Relation Age of Onset  . Heart disease Father        Deceased  . Hypertension Father   . Lung cancer Father   . Melanoma Father        Eye Removal  . Hyperlipidemia Mother   . Hypertension Mother   . Colon polyps Mother   . Hyperlipidemia Maternal Grandmother   . Diabetes Maternal Grandmother   . Dementia Maternal Grandmother   . Hypertension Sister        x2  . Colon polyps Sister   . Hyperlipidemia Sister        x2  . Colon polyps Sister   . Colon cancer Neg Hx   . Esophageal cancer Neg Hx   . Rectal cancer Neg Hx   . Stomach cancer Neg Hx     Social History   Socioeconomic History  .  Marital status: Married    Spouse name: Not on file  . Number of children: Not on file  . Years of education: Not on file  . Highest education level: Not on file  Occupational History  . Not on file  Social Needs  . Financial resource strain: Not on file  . Food insecurity:    Worry: Not on file    Inability: Not on file  . Transportation needs:    Medical: Not on file    Non-medical: Not on file  Tobacco Use  . Smoking status: Never Smoker  . Smokeless tobacco: Never Used  Substance and Sexual Activity  . Alcohol use: No  . Drug use: No  . 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 Physical activity:    Days per week: Not on file    Minutes per session: Not on file  . Stress: Not on file  Relationships  . Social connections:    Talks on phone: Not on file    Gets together: Not on file    Attends religious service: Not on file  Active member of club or organization: Not on file    Attends meetings of clubs or organizations: Not on file    Relationship status: Not on file  . Intimate partner violence:    Fear of current or ex partner: Not on file    Emotionally abused: Not on file    Physically abused: Not on file    Forced sexual activity: Not on file  Other Topics Concern  . Not on file  Social History Narrative  . Not on file    Outpatient Medications Prior to Visit  Medication Sig Dispense Refill  . blood glucose meter kit and supplies KIT Dispense based on patient and insurance preference. Check BS  daily as directed.  DzE11.9 1 each 0  . cetirizine (ZYRTEC) 10 MG tablet Take 10 mg by mouth daily as needed.    . fluticasone (FLONASE) 50 MCG/ACT nasal spray Place 2 sprays into the nose daily as needed.    . metoprolol succinate (TOPROL-XL) 50 MG 24 hr tablet 1 1/2 tab po qam and 1 po q pm Take with or immediately following a meal. 180 tablet 2  .  norgestrel-ethinyl estradiol (LO/OVRAL,CRYSELLE) 0.3-30 MG-MCG tablet Take 1 tablet by mouth daily. 1 Package 0  . omeprazole (PRILOSEC) 20 MG capsule Take 20 mg by mouth daily.    . ONE TOUCH ULTRA TEST test strip   0  . ONETOUCH DELICA LANCETS 59E MISC   0  . rosuvastatin (CRESTOR) 40 MG tablet TAKE 1 TABLET (40 MG TOTAL) BY MOUTH DAILY. 90 tablet 1  . triamterene-hydrochlorothiazide (MAXZIDE-25) 37.5-25 MG tablet Take 1 tablet by mouth daily. 90 tablet 1  . lisinopril (PRINIVIL,ZESTRIL) 10 MG tablet Take 1 tablet (10 mg total) by mouth daily. 90 tablet 1  . metFORMIN (GLUCOPHAGE) 500 MG tablet TAKE 1 TABLET (500 MG TOTAL) BY MOUTH 2 (TWO) TIMES DAILY WITH A MEAL. 180 tablet 1  . azithromycin (ZITHROMAX) 250 MG tablet Use as a zpack 6 tablet 0  . ezetimibe (ZETIA) 10 MG tablet TAKE 1 TABLET (10 MG TOTAL) BY MOUTH DAILY. (Patient not taking: Reported on 04/27/2018) 30 tablet 0   No facility-administered medications prior to visit.     Allergies  Allergen Reactions  . Doxycycline Monohydrate     GI Issues.  . Niacin And Related     Extreme flushing despite taking it correctly  . Tetracyclines & Related Other (See Comments)    GI Issues.    Review of Systems  Constitutional: Negative for fever and malaise/fatigue.  HENT: Negative for congestion.   Eyes: Negative for blurred vision.  Respiratory: Negative for shortness of breath.   Cardiovascular: Negative for chest pain, palpitations and leg swelling.  Gastrointestinal: Negative for abdominal pain, blood in stool and nausea.  Genitourinary: Negative for dysuria and frequency.  Musculoskeletal: Negative for falls.  Skin: Negative for rash.  Neurological: Negative for dizziness, loss of consciousness and headaches.  Endo/Heme/Allergies: Negative for environmental allergies.  Psychiatric/Behavioral: Negative for depression. The patient is not nervous/anxious.        Objective:    Physical Exam Vitals signs and nursing note  reviewed.  Constitutional:      General: She is not in acute distress.    Appearance: She is well-developed.  HENT:     Head: Normocephalic and atraumatic.     Nose: Nose normal.  Eyes:     General:        Right eye: No discharge.  Left eye: No discharge.  Neck:     Musculoskeletal: Normal range of motion and neck supple.  Cardiovascular:     Rate and Rhythm: Normal rate and regular rhythm.     Heart sounds: No murmur.  Pulmonary:     Effort: Pulmonary effort is normal.     Breath sounds: Normal breath sounds.  Abdominal:     General: Bowel sounds are normal.     Palpations: Abdomen is soft.     Tenderness: There is no abdominal tenderness.  Skin:    General: Skin is warm and dry.  Neurological:     Mental Status: She is alert and oriented to person, place, and time.     BP 98/72 (BP Location: Left Arm, Patient Position: Sitting, Cuff Size: Normal)   Pulse (!) 105   Temp 98.3 F (36.8 C) (Oral)   Resp 18   Ht '5\' 3"'  (1.6 m)   Wt 140 lb 9.6 oz (63.8 kg)   SpO2 99%   BMI 24.91 kg/m  Wt Readings from Last 3 Encounters:  04/27/18 140 lb 9.6 oz (63.8 kg)  03/24/18 145 lb (65.8 kg)  01/04/18 146 lb (66.2 kg)     Lab Results  Component Value Date   WBC 6.4 04/12/2018   HGB 10.0 (L) 04/12/2018   HCT 29.6 (L) 04/12/2018   PLT 250.0 04/12/2018   GLUCOSE 125 (H) 04/12/2018   CHOL 231 (H) 04/12/2018   TRIG 346.0 (H) 04/12/2018   HDL 30.90 (L) 04/12/2018   LDLDIRECT 133.0 04/12/2018   LDLCALC 107 (H) 06/03/2017   ALT 10 04/12/2018   AST 14 04/12/2018   NA 139 04/12/2018   K 3.9 04/12/2018   CL 106 04/12/2018   CREATININE 1.39 (H) 04/12/2018   BUN 12 04/12/2018   CO2 22 04/12/2018   TSH 2.18 04/12/2018   HGBA1C 7.7 (H) 04/12/2018    Lab Results  Component Value Date   TSH 2.18 04/12/2018   Lab Results  Component Value Date   WBC 6.4 04/12/2018   HGB 10.0 (L) 04/12/2018   HCT 29.6 (L) 04/12/2018   MCV 90.3 04/12/2018   PLT 250.0 04/12/2018    Lab Results  Component Value Date   NA 139 04/12/2018   K 3.9 04/12/2018   CO2 22 04/12/2018   GLUCOSE 125 (H) 04/12/2018   BUN 12 04/12/2018   CREATININE 1.39 (H) 04/12/2018   BILITOT 0.5 04/12/2018   ALKPHOS 41 04/12/2018   AST 14 04/12/2018   ALT 10 04/12/2018   PROT 6.4 04/12/2018   ALBUMIN 3.8 04/12/2018   CALCIUM 9.0 04/12/2018   GFR 39.68 (L) 04/12/2018   Lab Results  Component Value Date   CHOL 231 (H) 04/12/2018   Lab Results  Component Value Date   HDL 30.90 (L) 04/12/2018   Lab Results  Component Value Date   LDLCALC 107 (H) 06/03/2017   Lab Results  Component Value Date   TRIG 346.0 (H) 04/12/2018   Lab Results  Component Value Date   CHOLHDL 7 04/12/2018   Lab Results  Component Value Date   HGBA1C 7.7 (H) 04/12/2018       Assessment & Plan:   Problem List Items Addressed This Visit    Hypertension, benign essential, goal below 140/90    Overtreated. Will drop the Lisinopril to 5 mg dailyu      Relevant Medications   lisinopril (PRINIVIL,ZESTRIL) 10 MG tablet   Pitavastatin Calcium (LIVALO) 4 MG TABS   Other Relevant  Orders   CBC   Comprehensive metabolic panel   TSH   Hypercholesterolemia    Was taking her Rosuvastatin 40 mg daily and was still too high on Atorvastatin 80 mg is given a prescription of Livalo 4 mg to see if her insurance cover. If they will not will go back to Atorvastatin 80 mg       Relevant Medications   lisinopril (PRINIVIL,ZESTRIL) 10 MG tablet   Pitavastatin Calcium (LIVALO) 4 MG TABS   Other Relevant Orders   Lipid panel   DM2 (diabetes mellitus, type 2) ([facility])    Increase Metformin to 1000 mg po bid      Relevant Medications   metFORMIN (GLUCOPHAGE) 500 MG tablet   lisinopril (PRINIVIL,ZESTRIL) 10 MG tablet   Pitavastatin Calcium (LIVALO) 4 MG TABS   Other Relevant Orders   Hemoglobin A1c   Overweight    Good weight loss since last visit. No changes      Renal insufficiency    Hydrate and  monitor       Other Visit Diagnoses    Anemia, unspecified type    -  Primary   Relevant Orders   Ferritin (Completed)   Vitamin B12 (Completed)   Colon cancer screening       Relevant Orders   Ambulatory referral to Gastroenterology      I have discontinued Shanera B. Chatmon's ezetimibe and azithromycin. I have also changed her lisinopril. Additionally, I am having her start on Pitavastatin Calcium. Lastly, I am having her maintain her fluticasone, cetirizine, norgestrel-ethinyl estradiol, omeprazole, blood glucose meter kit and supplies, ONE TOUCH ULTRA TEST, ONETOUCH DELICA LANCETS 81Y, metoprolol succinate, triamterene-hydrochlorothiazide, rosuvastatin, and metFORMIN.  Meds ordered this encounter  Medications  . metFORMIN (GLUCOPHAGE) 500 MG tablet    Sig: Take 1 tablet (500 mg total) by mouth 2 (two) times daily with a meal.    Dispense:  360 tablet    Refill:  1  . lisinopril (PRINIVIL,ZESTRIL) 10 MG tablet    Sig: Take 0.5 tablets (5 mg total) by mouth daily.    Dispense:  90 tablet    Refill:  1  . Pitavastatin Calcium (LIVALO) 4 MG TABS    Sig: Take 1 tablet (4 mg total) by mouth at bedtime.    Dispense:  90 tablet    Refill:  1     Penni Homans, MD

## 2018-04-28 NOTE — Telephone Encounter (Signed)
Copied from CRM [id number]. Topic: General - Other >> Apr 28, 2018 11:20 AM Marissa Guzman wrote: Reason for CRM: Patient called to request that the nurse or doctor Abner Greenspan call her regarding a GI appointment that she does not know about.  Patient said she did not know she was suppose to have an appointment with a GI doctor.  Patient also received a fecal test in the mail and through it out because she thought it was mixed up with another patient.  Please call patient back to explain what these appointments are for and explain what she needs to do.  CB# [phone]

## 2018-04-29 MED ORDER — B-12 1000 MCG SL SUBL: 1000.0000 ug | SUBLINGUAL_TABLET | Freq: Every day | SUBLINGUAL | 1 refills | Status: DC

## 2018-04-29 NOTE — Addendum Note (Signed)
Addended by: Crissie Sickles A on: 04/29/2018 03:20 PM   Modules accepted: Orders

## 2018-04-29 NOTE — Addendum Note (Signed)
Addended by: Crissie SicklesARTER, Laurajean Hosek A on: 04/29/2018 03:19 PM   Modules accepted: Orders

## 2018-04-30 ENCOUNTER — Other Ambulatory Visit (INDEPENDENT_AMBULATORY_CARE_PROVIDER_SITE_OTHER): Payer: BLUE CROSS/BLUE SHIELD

## 2018-04-30 DIAGNOSIS — I10 Essential (primary) hypertension: Secondary | ICD-10-CM

## 2018-04-30 DIAGNOSIS — E7800 Pure hypercholesterolemia, unspecified: Secondary | ICD-10-CM | POA: Diagnosis not present

## 2018-04-30 DIAGNOSIS — E119 Type 2 diabetes mellitus without complications: Secondary | ICD-10-CM

## 2018-04-30 DIAGNOSIS — D649 Anemia, unspecified: Secondary | ICD-10-CM

## 2018-04-30 LAB — LIPID PANEL
Cholesterol: 256 mg/dL — ABNORMAL HIGH (ref 0–200)
HDL: 32.3 mg/dL — ABNORMAL LOW (ref >39.00)
Total CHOL/HDL Ratio: 8
Triglycerides: 595 mg/dL — ABNORMAL HIGH (ref 0.0–149.0)

## 2018-04-30 LAB — COMPREHENSIVE METABOLIC PANEL
ALT: 12 U/L (ref 0–35)
AST: 16 U/L (ref 0–37)
Albumin: 4.4 g/dL (ref 3.5–5.2)
Alkaline Phosphatase: 47 U/L (ref 39–117)
BUN: 27 mg/dL — ABNORMAL HIGH (ref 6–23)
CO2: 24 mEq/L (ref 19–32)
Calcium: 10.2 mg/dL (ref 8.4–10.5)
Chloride: 100 mEq/L (ref 96–112)
Creatinine, Ser: 1.83 mg/dL — ABNORMAL HIGH (ref 0.40–1.20)
GFR: 28.89 mL/min — ABNORMAL LOW (ref >60.00)
Glucose, Bld: 83 mg/dL (ref 70–99)
Potassium: 4.1 mEq/L (ref 3.5–5.1)
Sodium: 140 mEq/L (ref 135–145)
TOTAL PROTEIN: 7.4 g/dL (ref 6.0–8.3)
Total Bilirubin: 0.8 mg/dL (ref 0.2–1.2)

## 2018-04-30 LAB — CBC
HCT: 36.1 % (ref 36.0–46.0)
Hemoglobin: 12.2 g/dL (ref 12.0–15.0)
MCHC: 33.7 g/dL (ref 30.0–36.0)
MCV: 90.5 fl (ref 78.0–100.0)
Platelets: 292 10*3/uL (ref 150.0–400.0)
RBC: 3.99 Mil/uL (ref 3.87–5.11)
RDW: 13.7 % (ref 11.5–15.5)
WBC: 8.1 10*3/uL (ref 4.0–10.5)

## 2018-04-30 LAB — TSH: TSH: 2.24 u[IU]/mL (ref 0.35–4.50)

## 2018-04-30 LAB — LDL CHOLESTEROL, DIRECT: LDL DIRECT: 153 mg/dL

## 2018-04-30 LAB — HEMOGLOBIN A1C: Hgb A1c MFr Bld: 6.8 % — ABNORMAL HIGH (ref 4.6–6.5)

## 2018-05-03 ENCOUNTER — Telehealth: Payer: Self-pay

## 2018-05-03 LAB — INTRINSIC FACTOR ANTIBODIES: Intrinsic Factor: NEGATIVE

## 2018-05-03 NOTE — Telephone Encounter (Signed)
PA initiated via Covermymeds; KEY: [id number]. Awaiting determination.

## 2018-05-04 ENCOUNTER — Ambulatory Visit: Payer: BLUE CROSS/BLUE SHIELD

## 2018-05-04 DIAGNOSIS — I10 Essential (primary) hypertension: Secondary | ICD-10-CM

## 2018-05-04 MED ORDER — LIRAGLUTIDE 18 MG/3ML ~~LOC~~ SOPN: 1.2000 mg | PEN_INJECTOR | Freq: Every day | SUBCUTANEOUS | 3 refills | Status: DC

## 2018-05-04 MED ORDER — INSULIN PEN NEEDLE 32G X 6 MM MISC: 1 refills | Status: DC

## 2018-05-04 MED FILL — TECHLITE PEN NDL 32GX1/4: 32G X 6 MM | 90 days supply | Qty: 100 | Fill #0

## 2018-05-04 MED FILL — VICTOZA 2-PAK 18 MG/3 ML PE: 18 | 60 days supply | Qty: 12 | Fill #0

## 2018-05-04 MED FILL — TECHLITE PEN NDL 32GX1/4": 32G X 6 MM | 90 days supply | Qty: 100 | Fill #0

## 2018-05-04 NOTE — Progress Notes (Signed)
Patient came in today for Victoza teaching. Patient was instructed to start 0.65ml for 7 days then increase 1.72ml daily She voiced her understanding.

## 2018-05-04 NOTE — Telephone Encounter (Signed)
Spoke with patient about her concerns    Dr. Abner Greenspan when you placed the ref for Gastro on 04/27/18 when she went home there was an appointment scheduled for her for 04/28/18 she stated they never called her and let her know anything. She canceled the appointment, however I did let her know it was for her Colonoscopy and to call them when she is ready to schedule

## 2018-05-05 ENCOUNTER — Telehealth: Payer: Self-pay | Admitting: *Deleted

## 2018-05-05 MED FILL — LIVALO 4 MG TABLET: 4 | 90 days supply | Qty: 90 | Fill #0

## 2018-05-05 NOTE — Telephone Encounter (Signed)
PA approved. Effective from 05/03/2018 through 05/01/2021.

## 2018-05-05 NOTE — Telephone Encounter (Signed)
Received Diabetic Eye Exam Report from [facility]; forwarded to provider/SLS 02/19

## 2018-05-11 DIAGNOSIS — Z3041 Encounter for surveillance of contraceptive pills: Secondary | ICD-10-CM | POA: Diagnosis not present

## 2018-05-11 DIAGNOSIS — Z01419 Encounter for gynecological examination (general) (routine) without abnormal findings: Secondary | ICD-10-CM | POA: Diagnosis not present

## 2018-05-13 ENCOUNTER — Other Ambulatory Visit (INDEPENDENT_AMBULATORY_CARE_PROVIDER_SITE_OTHER): Payer: BLUE CROSS/BLUE SHIELD

## 2018-05-13 DIAGNOSIS — I10 Essential (primary) hypertension: Secondary | ICD-10-CM | POA: Diagnosis not present

## 2018-05-13 LAB — COMPREHENSIVE METABOLIC PANEL
ALT: 15 U/L (ref 0–35)
AST: 22 U/L (ref 0–37)
Albumin: 4.6 g/dL (ref 3.5–5.2)
Alkaline Phosphatase: 47 U/L (ref 39–117)
BUN: 25 mg/dL — ABNORMAL HIGH (ref 6–23)
CALCIUM: 10.3 mg/dL (ref 8.4–10.5)
CO2: 30 mEq/L (ref 19–32)
Chloride: 98 mEq/L (ref 96–112)
Creatinine, Ser: 1.58 mg/dL — ABNORMAL HIGH (ref 0.40–1.20)
GFR: 34.22 mL/min — ABNORMAL LOW (ref >60.00)
Glucose, Bld: 106 mg/dL — ABNORMAL HIGH (ref 70–99)
Potassium: 4.3 mEq/L (ref 3.5–5.1)
Sodium: 142 mEq/L (ref 135–145)
Total Bilirubin: 0.9 mg/dL (ref 0.2–1.2)
Total Protein: 7.7 g/dL (ref 6.0–8.3)

## 2018-05-20 ENCOUNTER — Encounter: Payer: Self-pay | Admitting: Family Medicine

## 2018-05-25 ENCOUNTER — Telehealth: Payer: Self-pay

## 2018-05-25 ENCOUNTER — Encounter: Payer: Self-pay | Admitting: Family Medicine

## 2018-05-25 NOTE — Telephone Encounter (Signed)
Encouraged increased hydration and fiber in diet. Daily probiotics. If bowels not moving can use MOM 2 tbls po in 4 oz of warm prune juice by mouth every 2-3 days. If no results then repeat in 4 hours with  Dulcolax suppository pr, may repeat again in 4 more hours as needed. Seek care if symptoms worsen. Consider daily Miralax and/or Dulcolax if symptoms persist. Add Miralax and Benefiber together and take once to twice daily

## 2018-05-25 NOTE — Telephone Encounter (Signed)
Copied from CRM [id number]. Topic: Quick Communication - Rx Refill/Question >> May 24, 2018  9:36 AM Marissa Guzman E wrote: Medication: liraglutide (VICTOZA) 18 MG/3ML SOPN - Pt started this med almost a month ago and it has made her constipated. Pt wants to know if there is something else she can take that she will not become dependant on/ Dr. Abner Greenspan or nurse please advise    Please advise

## 2018-05-28 NOTE — Telephone Encounter (Signed)
Left message for patient to call the office back

## 2018-05-31 ENCOUNTER — Other Ambulatory Visit: Payer: Self-pay | Admitting: Cardiology

## 2018-05-31 DIAGNOSIS — I10 Essential (primary) hypertension: Secondary | ICD-10-CM

## 2018-05-31 MED FILL — METOPROLOL SUCCINATE ER 50: 50 | 12 days supply | Qty: 30 | Fill #0

## 2018-06-17 ENCOUNTER — Other Ambulatory Visit: Payer: Self-pay | Admitting: Cardiology

## 2018-06-17 DIAGNOSIS — I10 Essential (primary) hypertension: Secondary | ICD-10-CM

## 2018-06-17 MED FILL — METOPROLOL SUCCINATE ER 50: 50 | 36 days supply | Qty: 90 | Fill #0

## 2018-07-05 ENCOUNTER — Other Ambulatory Visit: Payer: Self-pay | Admitting: Family Medicine

## 2018-07-05 MED FILL — TRIAMTERENE-HCTZ 37.5-25 MG: 37.5-25 | 90 days supply | Qty: 90 | Fill #0

## 2018-07-26 ENCOUNTER — Other Ambulatory Visit: Payer: Self-pay

## 2018-07-26 ENCOUNTER — Other Ambulatory Visit (INDEPENDENT_AMBULATORY_CARE_PROVIDER_SITE_OTHER): Payer: BLUE CROSS/BLUE SHIELD

## 2018-07-26 DIAGNOSIS — I10 Essential (primary) hypertension: Secondary | ICD-10-CM | POA: Diagnosis not present

## 2018-07-26 LAB — COMPREHENSIVE METABOLIC PANEL
ALT: 17 U/L (ref 0–35)
AST: 21 U/L (ref 0–37)
Albumin: 4.4 g/dL (ref 3.5–5.2)
Alkaline Phosphatase: 69 U/L (ref 39–117)
BUN: 18 mg/dL (ref 6–23)
CO2: 30 mEq/L (ref 19–32)
Calcium: 10.4 mg/dL (ref 8.4–10.5)
Chloride: 99 mEq/L (ref 96–112)
Creatinine, Ser: 1.18 mg/dL (ref 0.40–1.20)
GFR: 47.89 mL/min — ABNORMAL LOW (ref >60.00)
Glucose, Bld: 105 mg/dL — ABNORMAL HIGH (ref 70–99)
Potassium: 3.4 mEq/L — ABNORMAL LOW (ref 3.5–5.1)
Sodium: 141 mEq/L (ref 135–145)
Total Bilirubin: 1 mg/dL (ref 0.2–1.2)
Total Protein: 7.3 g/dL (ref 6.0–8.3)

## 2018-07-26 MED FILL — METOPROLOL SUCCINATE ER 50: 50 | 36 days supply | Qty: 90 | Fill #1

## 2018-07-26 MED FILL — VICTOZA 2-PAK 18 MG/3 ML PE: 18 | 60 days supply | Qty: 12 | Fill #1

## 2018-07-27 ENCOUNTER — Ambulatory Visit: Payer: BLUE CROSS/BLUE SHIELD | Admitting: Family Medicine

## 2018-07-29 ENCOUNTER — Ambulatory Visit: Payer: BLUE CROSS/BLUE SHIELD | Admitting: Family Medicine

## 2018-07-30 ENCOUNTER — Ambulatory Visit (INDEPENDENT_AMBULATORY_CARE_PROVIDER_SITE_OTHER): Payer: BLUE CROSS/BLUE SHIELD | Admitting: Family Medicine

## 2018-07-30 ENCOUNTER — Other Ambulatory Visit: Payer: Self-pay

## 2018-07-30 VITALS — Ht 63.5 in | Wt 128.0 lb

## 2018-07-30 DIAGNOSIS — K5900 Constipation, unspecified: Secondary | ICD-10-CM

## 2018-07-30 DIAGNOSIS — E785 Hyperlipidemia, unspecified: Secondary | ICD-10-CM

## 2018-07-30 DIAGNOSIS — E538 Deficiency of other specified B group vitamins: Secondary | ICD-10-CM

## 2018-07-30 DIAGNOSIS — I10 Essential (primary) hypertension: Secondary | ICD-10-CM | POA: Diagnosis not present

## 2018-07-30 DIAGNOSIS — E7800 Pure hypercholesterolemia, unspecified: Secondary | ICD-10-CM

## 2018-07-30 DIAGNOSIS — M791 Myalgia, unspecified site: Secondary | ICD-10-CM

## 2018-07-30 DIAGNOSIS — E119 Type 2 diabetes mellitus without complications: Secondary | ICD-10-CM

## 2018-07-30 DIAGNOSIS — N289 Disorder of kidney and ureter, unspecified: Secondary | ICD-10-CM

## 2018-07-30 NOTE — Assessment & Plan Note (Signed)
no changes to meds. Encouraged heart healthy diet such as the DASH diet and exercise as tolerated.  

## 2018-07-30 NOTE — Assessment & Plan Note (Signed)
Encouraged heart healthy diet, increase exercise, avoid trans fats. Is having myalgias

## 2018-07-30 NOTE — Assessment & Plan Note (Signed)
Encouraged increased hydration and fiber in diet. Daily probiotics. If bowels not moving can use MOM 2 tbls po in 4 oz of warm prune juice by mouth every 2-3 days. If no results then repeat in 4 hours with  Dulcolax suppository pr, may repeat again in 4 more hours as needed. Seek care if symptoms worsen. Consider daily Miralax and/or Dulcolax if symptoms persist.  

## 2018-07-30 NOTE — Assessment & Plan Note (Addendum)
hgba1c acceptable, minimize simple carbs. Increase exercise as tolerated. Continue current meds. Good weight loss with Victoza

## 2018-08-01 NOTE — Assessment & Plan Note (Signed)
Flared over past 1-2 months. After Livalo was increased. Will stop for now and have her add CoQ10 enzyme and we will check labs

## 2018-08-01 NOTE — Assessment & Plan Note (Signed)
Hydrate and monitor. Improved on last check

## 2018-08-01 NOTE — Progress Notes (Signed)
Virtual Visit via Video Note  I connected with Tiki Tucciarone Brandle on 07/30/2018 at 10:40 AM EDT by a video enabled telemedicine application and verified that I am speaking with the correct person using two identifiers.  Location: Patient: home Provider: home   I discussed the limitations of evaluation and management by telemedicine and the availability of in person appointments. The patient expressed understanding and agreed to proceed. Magdalene Molly, CMA was able to get patient set up for video visit.    Subjective:    Patient ID: Marissa Guzman, female    DOB: Nov 29, 1965, 53 y.o.   MRN: [id number]  Chief Complaint  Patient presents with  . Muscle Pain    Pt thinks muscle pain may be assoicated with a medication. States no fever or other symptoms    HPI Patient is in today for follow up on chronic medical concerns including hyperlipidemia, myalgias, diabetes and more. No recent febrile illness or hospitalizations. She is tolerating Victoza and has had her appetite suppressed and lost weight. She is noting a significant increase in diffuse muscle pain since increasing Livalo. Denies CP/palp/SOB/HA/congestion/fevers/GI or GU c/o. Taking meds as prescribed  Past Medical History:  Diagnosis Date  . Acute bronchitis 05/16/2012  . Allergic state 05/16/2012  . Allergy   . Constipation 04/16/2014  . Diabetes mellitus without complication ([place])   . DM2 (diabetes mellitus, type 2) ([place]) 08/10/2015  . GERD (gastroesophageal reflux disease) 06/12/2013  . Hip pain, bilateral 08/19/2015  . HTN (hypertension)   . Hyperglycemia 08/10/2015  . Hyperlipidemia   . Hypokalemia 11/14/2012  . Overweight 08/14/2016  . Seasonal allergies     Past Surgical History:  Procedure Laterality Date  . CESAREAN SECTION    . HEMORRHOID SURGERY     two    Family History  Problem Relation Age of Onset  . Heart disease Father        Deceased  . Hypertension Father   . Lung cancer Father   . Melanoma Father      Eye Removal  . Hyperlipidemia Mother   . Hypertension Mother   . Colon polyps Mother   . Hyperlipidemia Maternal Grandmother   . Diabetes Maternal Grandmother   . Dementia Maternal Grandmother   . Hypertension Sister        x2  . Colon polyps Sister   . Hyperlipidemia Sister        x2  . Colon polyps Sister   . Colon cancer Neg Hx   . Esophageal cancer Neg Hx   . Rectal cancer Neg Hx   . Stomach cancer Neg Hx     Social History   Socioeconomic History  . Marital status: Married    Spouse name: Not on file  . Number of children: Not on file  . Years of education: Not on file  . Highest education level: Not on file  Occupational History  . Not on file  Social Needs  . Financial resource strain: Not on file  . Food insecurity:    Worry: Not on file    Inability: Not on file  . Transportation needs:    Medical: Not on file    Non-medical: Not on file  Tobacco Use  . Smoking status: Never Smoker  . Smokeless tobacco: Never Used  Substance and Sexual Activity  . Alcohol use: No  . Drug use: No  . 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 Physical activity:    Days per week: Not on file    Minutes per session: Not on file  . Stress: Not on file  Relationships  . Social connections:    Talks on phone: Not on file    Gets together: Not on file    Attends religious service: Not on file    Active member of club or organization: Not on file    Attends meetings of clubs or organizations: Not on file    Relationship status: Not on file  . Intimate partner violence:    Fear of current or ex partner: Not on file    Emotionally abused: Not on file    Physically abused: Not on file    Forced sexual activity: Not on file  Other Topics Concern  . Not on file  Social History Narrative  . Not on file    Outpatient Medications Prior to Visit  Medication Sig  Dispense Refill  . blood glucose meter kit and supplies KIT Dispense based on patient and insurance preference. Check BS  daily as directed.  DzE11.9 1 each 0  . cetirizine (ZYRTEC) 10 MG tablet Take 10 mg by mouth daily as needed.    . Cyanocobalamin (B-12) 1000 MCG SUBL Place 1,000 mcg under the tongue daily. 30 each 1  . fluticasone (FLONASE) 50 MCG/ACT nasal spray Place 2 sprays into the nose daily as needed.    . Insulin Pen Needle 32G X 6 MM MISC Inject 1.34m into skin daily Dz E11.9 100 each 1  . metoprolol succinate (TOPROL-XL) 50 MG 24 hr tablet TAKE 1 & 1/2 TABLET BY MOUTH IN THE MORNING AND 1 TABLET IN THE EVENING WITH OR IMMEDIATELY FOLLOWING A MEAL 90 tablet 1  . omeprazole (PRILOSEC) 20 MG capsule Take 20 mg by mouth daily.    . ONE TOUCH ULTRA TEST test strip   0  . ONETOUCH DELICA LANCETS 307HMISC   0  . triamterene-hydrochlorothiazide (MAXZIDE-25) 37.5-25 MG tablet TAKE 1 TABLET BY MOUTH DAILY. 90 tablet 1  . liraglutide (VICTOZA) 18 MG/3ML SOPN Inject 0.2 mLs (1.2 mg total) into the skin daily. 4 pen 3  . norgestrel-ethinyl estradiol (LO/OVRAL,CRYSELLE) 0.3-30 MG-MCG tablet Take 1 tablet by mouth daily. 1 Package 0  . Pitavastatin Calcium (LIVALO) 4 MG TABS Take 1 tablet (4 mg total) by mouth at bedtime. 90 tablet 1  . rosuvastatin (CRESTOR) 40 MG tablet TAKE 1 TABLET (40 MG TOTAL) BY MOUTH DAILY. 90 tablet 1   No facility-administered medications prior to visit.     Allergies  Allergen Reactions  . Fenofibrate Other (See Comments)    myalgias  . Doxycycline Monohydrate     GI Issues.  . Niacin And Related     Extreme flushing despite taking it correctly  . Statins Other (See Comments)    Livalo, crestor  . Tetracyclines & Related Other (See Comments)    GI Issues.    Review of Systems  Constitutional: Negative for fever and malaise/fatigue.  HENT: Negative for congestion.   Eyes: Negative for blurred vision.  Respiratory: Negative for shortness of breath.    Cardiovascular: Negative for chest pain, palpitations and leg swelling.  Gastrointestinal: Negative for abdominal pain, blood in stool and nausea.  Genitourinary: Negative for dysuria and frequency.  Musculoskeletal: Positive for myalgias. Negative for falls.  Skin: Negative for rash.  Neurological: Negative for dizziness, loss of consciousness and headaches.  Endo/Heme/Allergies: Negative for  environmental allergies.  Psychiatric/Behavioral: Negative for depression. The patient is not nervous/anxious.        Objective:    Physical Exam Constitutional:      Appearance: Normal appearance. She is not ill-appearing.  HENT:     Head: Normocephalic and atraumatic.  Pulmonary:     Effort: Pulmonary effort is normal.  Neurological:     Mental Status: She is alert and oriented to person, place, and time.  Psychiatric:        Mood and Affect: Mood normal.        Behavior: Behavior normal.     Ht 5' 3.5" (1.613 m)   Wt 128 lb (58.1 kg)   BMI 22.32 kg/m  Wt Readings from Last 3 Encounters:  07/30/18 128 lb (58.1 kg)  04/27/18 140 lb 9.6 oz (63.8 kg)  03/24/18 145 lb (65.8 kg)    Diabetic Foot Exam - Simple   No data filed     Lab Results  Component Value Date   WBC 8.1 04/30/2018   HGB 12.2 04/30/2018   HCT 36.1 04/30/2018   PLT 292.0 04/30/2018   GLUCOSE 105 (H) 07/26/2018   CHOL 256 (H) 04/30/2018   TRIG (H) 04/30/2018    595.0 Triglyceride is over 400; calculations on Lipids are invalid.   HDL 32.30 (L) 04/30/2018   LDLDIRECT 153.0 04/30/2018   LDLCALC 107 (H) 06/03/2017   ALT 17 07/26/2018   AST 21 07/26/2018   NA 141 07/26/2018   K 3.4 (L) 07/26/2018   CL 99 07/26/2018   CREATININE 1.18 07/26/2018   BUN 18 07/26/2018   CO2 30 07/26/2018   TSH 2.24 04/30/2018   HGBA1C 6.8 (H) 04/30/2018    Lab Results  Component Value Date   TSH 2.24 04/30/2018   Lab Results  Component Value Date   WBC 8.1 04/30/2018   HGB 12.2 04/30/2018   HCT 36.1 04/30/2018    MCV 90.5 04/30/2018   PLT 292.0 04/30/2018   Lab Results  Component Value Date   NA 141 07/26/2018   K 3.4 (L) 07/26/2018   CO2 30 07/26/2018   GLUCOSE 105 (H) 07/26/2018   BUN 18 07/26/2018   CREATININE 1.18 07/26/2018   BILITOT 1.0 07/26/2018   ALKPHOS 69 07/26/2018   AST 21 07/26/2018   ALT 17 07/26/2018   PROT 7.3 07/26/2018   ALBUMIN 4.4 07/26/2018   CALCIUM 10.4 07/26/2018   GFR 47.89 (L) 07/26/2018   Lab Results  Component Value Date   CHOL 256 (H) 04/30/2018   Lab Results  Component Value Date   HDL 32.30 (L) 04/30/2018   Lab Results  Component Value Date   LDLCALC 107 (H) 06/03/2017   Lab Results  Component Value Date   TRIG (H) 04/30/2018    595.0 Triglyceride is over 400; calculations on Lipids are invalid.   Lab Results  Component Value Date   CHOLHDL 8 04/30/2018   Lab Results  Component Value Date   HGBA1C 6.8 (H) 04/30/2018       Assessment & Plan:   Problem List Items Addressed This Visit    Hypertension, benign essential, goal below 140/90    no changes to meds. Encouraged heart healthy diet such as the DASH diet and exercise as tolerated.       Relevant Orders   Comprehensive metabolic panel   Hypercholesterolemia    Encouraged heart healthy diet, increase exercise, avoid trans fats. Is having myalgias      Constipation  Encouraged increased hydration and fiber in diet. Daily probiotics. If bowels not moving can use MOM 2 tbls po in 4 oz of warm prune juice by mouth every 2-3 days. If no results then repeat in 4 hours with  Dulcolax suppository pr, may repeat again in 4 more hours as needed. Seek care if symptoms worsen. Consider daily Miralax and/or Dulcolax if symptoms persist.       DM2 (diabetes mellitus, type 2) ([place]) - Primary    hgba1c acceptable, minimize simple carbs. Increase exercise as tolerated. Continue current meds. Good weight loss with Victoza      Relevant Orders   Hemoglobin A1C   Myalgia    Flared over past  1-2 months. After Livalo was increased. Will stop for now and have her add CoQ10 enzyme and we will check labs       Renal insufficiency    Hydrate and monitor. Improved on last check       Other Visit Diagnoses    Low vitamin B12 level       Relevant Orders   Vitamin B12   Hyperlipidemia, unspecified hyperlipidemia type       Relevant Orders   Lipid panel      I have discontinued Jeannette B. Foushee's norgestrel-ethinyl estradiol, rosuvastatin, Pitavastatin Calcium, and liraglutide. I am also having her maintain her fluticasone, cetirizine, omeprazole, blood glucose meter kit and supplies, ONE TOUCH ULTRA TEST, OneTouch Delica Lancets 79Y, I-01, Insulin Pen Needle, metoprolol succinate, and triamterene-hydrochlorothiazide.  No orders of the defined types were placed in this encounter.   I discussed the assessment and treatment plan with the patient. The patient was provided an opportunity to ask questions and all were answered. The patient agreed with the plan and demonstrated an understanding of the instructions.   The patient was advised to call back or seek an in-person evaluation if the symptoms worsen or if the condition fails to improve as anticipated.  I provided 15 minutes of non-face-to-face time during this encounter.   Penni Homans, MD

## 2018-08-02 ENCOUNTER — Other Ambulatory Visit: Payer: Self-pay

## 2018-08-02 ENCOUNTER — Other Ambulatory Visit (INDEPENDENT_AMBULATORY_CARE_PROVIDER_SITE_OTHER): Payer: BLUE CROSS/BLUE SHIELD

## 2018-08-02 DIAGNOSIS — E119 Type 2 diabetes mellitus without complications: Secondary | ICD-10-CM

## 2018-08-02 DIAGNOSIS — E538 Deficiency of other specified B group vitamins: Secondary | ICD-10-CM | POA: Diagnosis not present

## 2018-08-02 DIAGNOSIS — E785 Hyperlipidemia, unspecified: Secondary | ICD-10-CM

## 2018-08-02 DIAGNOSIS — I10 Essential (primary) hypertension: Secondary | ICD-10-CM

## 2018-08-03 ENCOUNTER — Other Ambulatory Visit: Payer: Self-pay | Admitting: Family Medicine

## 2018-08-03 DIAGNOSIS — E7800 Pure hypercholesterolemia, unspecified: Secondary | ICD-10-CM

## 2018-08-03 LAB — COMPREHENSIVE METABOLIC PANEL
ALT: 15 U/L (ref 0–35)
AST: 21 U/L (ref 0–37)
Albumin: 4.5 g/dL (ref 3.5–5.2)
Alkaline Phosphatase: 65 U/L (ref 39–117)
BUN: 15 mg/dL (ref 6–23)
CO2: 32 mEq/L (ref 19–32)
Calcium: 10.3 mg/dL (ref 8.4–10.5)
Chloride: 99 mEq/L (ref 96–112)
Creatinine, Ser: 1.17 mg/dL (ref 0.40–1.20)
GFR: 48.36 mL/min — ABNORMAL LOW (ref >60.00)
Glucose, Bld: 94 mg/dL (ref 70–99)
Potassium: 3.2 mEq/L — ABNORMAL LOW (ref 3.5–5.1)
Sodium: 139 mEq/L (ref 135–145)
Total Bilirubin: 1.2 mg/dL (ref 0.2–1.2)
Total Protein: 7.6 g/dL (ref 6.0–8.3)

## 2018-08-03 LAB — LIPID PANEL
Cholesterol: 278 mg/dL — ABNORMAL HIGH (ref 0–200)
HDL: 38.1 mg/dL — ABNORMAL LOW (ref >39.00)
NonHDL: 239.9
Total CHOL/HDL Ratio: 7
Triglycerides: 210 mg/dL — ABNORMAL HIGH (ref 0.0–149.0)
VLDL: 42 mg/dL — ABNORMAL HIGH (ref 0.0–40.0)

## 2018-08-03 LAB — VITAMIN B12: Vitamin B-12: 1211 pg/mL — ABNORMAL HIGH (ref 211–911)

## 2018-08-03 LAB — LDL CHOLESTEROL, DIRECT: Direct LDL: 186 mg/dL

## 2018-08-03 LAB — HEMOGLOBIN A1C: Hgb A1c MFr Bld: 6.5 % (ref 4.6–6.5)

## 2018-08-03 NOTE — Progress Notes (Unsigned)
amb ref °

## 2018-08-04 ENCOUNTER — Other Ambulatory Visit: Payer: Self-pay

## 2018-08-04 MED ORDER — POTASSIUM CHLORIDE CRYS ER 20 MEQ PO TBCR: 20.0000 meq | EXTENDED_RELEASE_TABLET | Freq: Every day | ORAL | 2 refills | Status: DC

## 2018-08-04 MED FILL — POTASSIUM CHLORIDE CRYS ER: 20 | 30 days supply | Qty: 30 | Fill #0

## 2018-08-06 ENCOUNTER — Telehealth: Payer: Self-pay

## 2018-08-06 DIAGNOSIS — R748 Abnormal levels of other serum enzymes: Secondary | ICD-10-CM

## 2018-08-06 NOTE — Telephone Encounter (Signed)
ERROR

## 2018-08-10 DIAGNOSIS — R748 Abnormal levels of other serum enzymes: Secondary | ICD-10-CM

## 2018-08-10 DIAGNOSIS — I10 Essential (primary) hypertension: Secondary | ICD-10-CM

## 2018-08-12 ENCOUNTER — Encounter: Payer: Self-pay | Admitting: Family Medicine

## 2018-08-13 NOTE — Telephone Encounter (Signed)
Please advise 

## 2018-08-19 ENCOUNTER — Other Ambulatory Visit: Payer: Self-pay

## 2018-08-19 ENCOUNTER — Other Ambulatory Visit (INDEPENDENT_AMBULATORY_CARE_PROVIDER_SITE_OTHER): Payer: BLUE CROSS/BLUE SHIELD

## 2018-08-19 DIAGNOSIS — I10 Essential (primary) hypertension: Secondary | ICD-10-CM | POA: Diagnosis not present

## 2018-08-19 DIAGNOSIS — R748 Abnormal levels of other serum enzymes: Secondary | ICD-10-CM | POA: Diagnosis not present

## 2018-08-19 NOTE — Progress Notes (Signed)
[id number] 

## 2018-08-20 LAB — COMPREHENSIVE METABOLIC PANEL
ALT: 12 U/L (ref 0–35)
AST: 18 U/L (ref 0–37)
Albumin: 4.3 g/dL (ref 3.5–5.2)
Alkaline Phosphatase: 65 U/L (ref 39–117)
BUN: 13 mg/dL (ref 6–23)
CO2: 29 mEq/L (ref 19–32)
Calcium: 10.3 mg/dL (ref 8.4–10.5)
Chloride: 101 mEq/L (ref 96–112)
Creatinine, Ser: 1.01 mg/dL (ref 0.40–1.20)
GFR: 57.29 mL/min — ABNORMAL LOW (ref >60.00)
Glucose, Bld: 87 mg/dL (ref 70–99)
Potassium: 3.8 mEq/L (ref 3.5–5.1)
Sodium: 140 mEq/L (ref 135–145)
Total Bilirubin: 1.2 mg/dL (ref 0.2–1.2)
Total Protein: 6.9 g/dL (ref 6.0–8.3)

## 2018-08-20 LAB — VITAMIN B12: Vitamin B-12: 283 pg/mL (ref 211–911)

## 2018-08-27 ENCOUNTER — Encounter: Payer: Self-pay | Admitting: Family Medicine

## 2018-08-28 ENCOUNTER — Other Ambulatory Visit: Payer: Self-pay | Admitting: Family Medicine

## 2018-08-28 DIAGNOSIS — M791 Myalgia, unspecified site: Secondary | ICD-10-CM

## 2018-08-28 DIAGNOSIS — M2550 Pain in unspecified joint: Secondary | ICD-10-CM

## 2018-08-30 ENCOUNTER — Other Ambulatory Visit: Payer: Self-pay | Admitting: Cardiology

## 2018-08-30 DIAGNOSIS — I10 Essential (primary) hypertension: Secondary | ICD-10-CM

## 2018-08-30 MED FILL — POTASSIUM CHLORIDE CRYS ER: 20 | 30 days supply | Qty: 30 | Fill #1

## 2018-08-31 ENCOUNTER — Telehealth: Payer: Self-pay | Admitting: *Deleted

## 2018-08-31 DIAGNOSIS — M791 Myalgia, unspecified site: Secondary | ICD-10-CM

## 2018-08-31 MED FILL — METOPROLOL SUCCINATE ER 50: 50 | 30 days supply | Qty: 75 | Fill #0

## 2018-08-31 NOTE — Telephone Encounter (Signed)
Advised pt that referral was faxed to [facility]. Advised her that the Providers usually have to review her records first and then someone should contact her with an appt. If they have not contacted her within 5 days to call them to check the status. Pt voices understanding and agreeable to plan.  Copied from [place] [phone]. Topic: Referral - Question >> Aug 30, 2018  4:25 PM Nils Flack wrote: Reason for CRM: please call pt back about rheumatology referral.  Cb is [phone]

## 2018-09-01 MED FILL — TECHLITE PEN NDL 32GX1/4": 32G X 6 MM | 90 days supply | Qty: 100 | Fill #1

## 2018-09-01 MED FILL — TECHLITE PEN NDL 32GX1/4: 32G X 6 MM | 90 days supply | Qty: 100 | Fill #1

## 2018-09-02 NOTE — Telephone Encounter (Signed)
F/U Message             Patient is returning [place] call, pls call again at  [phone]

## 2018-09-02 NOTE — Telephone Encounter (Signed)
Received forms form [facility] they have denied seeing the patient at this time.  Please advise

## 2018-09-03 ENCOUNTER — Telehealth: Payer: Self-pay | Admitting: Internal Medicine

## 2018-09-03 NOTE — Telephone Encounter (Signed)
Try a different rheumatology group and let patient know. Also offer her an in person or virtual visit with [name] to discuss her options and symptoms

## 2018-09-03 NOTE — Telephone Encounter (Signed)
smartphone/ my chart active/ consent/ pre reg completed  °

## 2018-09-06 NOTE — Addendum Note (Signed)
Addended by: Magdalene Molly A on: 09/06/2018 03:54 PM   Modules accepted: Orders

## 2018-09-07 ENCOUNTER — Telehealth (INDEPENDENT_AMBULATORY_CARE_PROVIDER_SITE_OTHER): Payer: BLUE CROSS/BLUE SHIELD | Admitting: Internal Medicine

## 2018-09-07 VITALS — BP 104/78 | HR 98 | Ht 63.5 in | Wt 123.0 lb

## 2018-09-07 DIAGNOSIS — R9431 Abnormal electrocardiogram [ECG] [EKG]: Secondary | ICD-10-CM | POA: Diagnosis not present

## 2018-09-07 DIAGNOSIS — I10 Essential (primary) hypertension: Secondary | ICD-10-CM

## 2018-09-07 DIAGNOSIS — E7801 Familial hypercholesterolemia: Secondary | ICD-10-CM

## 2018-09-07 NOTE — Patient Instructions (Addendum)
Medication Instructions:  Dr. [address] [facility] recommends Repatha 140mg /mL - 1 dose every 14 days (PCSK9). This is an injectable cholesterol medication. This medication may need prior approval with your insurance company, which we will work on. We will submit for medication approval after the calcium score test results are in.  -- If the medication is not approved initially, we may need to do an appeal with your insurance.   If you need a refill on your cardiac medications before your next appointment, please call your pharmacy.   Lab work: FASTING lab work now - lipid panel, direct LDL -- you can use your PCP office's lab or Dr. office lab (open Monday-Friday from 8-4:30pm)  FASTING lab work in about 4 months (prior to next appointment with Dr. Blanchie Dessert) - lipid panel, direct LDL -- we will mail the lab order for this  due approximately late October 2020  If you have labs (blood work) drawn today and your tests are completely normal, you will receive your results only by: November 2020 MyChart Message (if you have MyChart) OR . A paper copy in the mail If you have any lab test that is abnormal or we need to change your treatment, we will call you to review the results.  Testing/Procedures: Dr. Marland Kitchen has ordered a CT coronary calcium score. This test is done at [address]. This is $150 out of pocket. You will get a call to schedule this test appointment.   Coronary CalciumScan A coronary calcium scan is an imaging test used to look for deposits of calcium and other fatty materials (plaques) in the inner lining of the blood vessels of the heart (coronary arteries). These deposits of calcium and plaques can partly clog and narrow the coronary arteries without producing any symptoms or warning signs. This puts a person at risk for a heart attack. This test can detect these deposits before symptoms develop. Tell a health care provider about:  Any allergies you have.  All medicines you are  taking, including vitamins, herbs, eye drops, creams, and over-the-counter medicines.  Any problems you or family members have had with anesthetic medicines.  Any blood disorders you have.  Any surgeries you have had.  Any medical conditions you have.  Whether you are pregnant or may be pregnant. What are the risks? Generally, this is a safe procedure. However, problems may occur, including:  Harm to a pregnant woman and her unborn baby. This test involves the use of radiation. Radiation exposure can be dangerous to a pregnant woman and her unborn baby. If you are pregnant, you generally should not have this procedure done.  Slight increase in the risk of cancer. This is because of the radiation involved in the test. What happens before the procedure? No preparation is needed for this procedure. What happens during the procedure?  You will undress and remove any jewelry around your neck or chest.  You will put on a hospital gown.  Sticky electrodes will be placed on your chest. The electrodes will be connected to an electrocardiogram (ECG) machine to record a tracing of the electrical activity of your heart.  A CT scanner will take pictures of your heart. During this time, you will be asked to lie still and hold your breath for 2-3 seconds while a picture of your heart is being taken. The procedure may vary among health care providers and hospitals. What happens after the procedure?  You can get dressed.  You can return to your  normal activities.  It is up to you to get the results of your test. Ask your health care provider, or the department that is doing the test, when your results will be ready. Summary  A coronary calcium scan is an imaging test used to look for deposits of calcium and other fatty materials (plaques) in the inner lining of the blood vessels of the heart (coronary arteries).  Generally, this is a safe procedure. Tell your health care provider if you are  pregnant or may be pregnant.  No preparation is needed for this procedure.  A CT scanner will take pictures of your heart.  You can return to your normal activities after the scan is done. This information is not intended to replace advice given to you by your health care provider. Make sure you discuss any questions you have with your health care provider. Document Released: 08/30/2007 Document Revised: 01/21/2016 Document Reviewed: 01/21/2016 Elsevier Interactive Patient Education  2017 [employer]    Follow-Up: Dr. Debara Pickett recommends that you schedule a follow up visit with him the in the [place] in 4 months.  Please have fasting blood work about 1 week prior to this visit and he will review the blood work results with you at your appointment.

## 2018-09-08 DIAGNOSIS — E782 Mixed hyperlipidemia: Secondary | ICD-10-CM | POA: Diagnosis not present

## 2018-09-08 LAB — LIPID PANEL
Chol/HDL Ratio: 10.9 ratio — ABNORMAL HIGH (ref 0.0–4.4)
Cholesterol, Total: 404 mg/dL — ABNORMAL HIGH (ref 100–199)
HDL: 37 mg/dL — ABNORMAL LOW (ref >39)
LDL Calculated: 328 mg/dL — ABNORMAL HIGH (ref 0–99)
Triglycerides: 195 mg/dL — ABNORMAL HIGH (ref 0–149)
VLDL Cholesterol Cal: 39 mg/dL (ref 5–40)

## 2018-09-08 LAB — LDL CHOLESTEROL, DIRECT: LDL Direct: 332 mg/dL — ABNORMAL HIGH (ref 0–99)

## 2018-09-09 NOTE — Progress Notes (Signed)
Virtual Visit via Video Note   This visit type was conducted due to national recommendations for restrictions regarding the COVID-19 Pandemic (e.g. social distancing) in an effort to limit this patient's exposure and mitigate transmission in our community.  Due to her co-morbid illnesses, this patient is at least at moderate risk for complications without adequate follow up.  This format is felt to be most appropriate for this patient at this time.  All issues noted in this document were discussed and addressed.  A limited physical exam was performed with this format.  Please refer to the patient's chart for her consent to telehealth for [facility].   Evaluation Performed:  Doximity video visit  Date:  09/09/2018   ID:  Marissa Guzman, DOB May 19, 1965, MRN [id number]  Patient Location:  [address] [place] [address]  Provider location:   [address] [place], [place] [address]  PCP:  [name], MD  Cardiologist:  No primary care provider on file. Electrophysiologist:  None   Chief Complaint:  No complaints, evaluate dyslipidemia  History of Present Illness:    Marissa Guzman is a 53 y.o. female who presents via audio/video conferencing for a telehealth visit today.  Marissa Guzman is a pleasant 53 year old female kindly referred by Dr. Charlett Blake for evaluation of dyslipidemia.  Medical history significant for type 2 diabetes, and, dyslipidemia and apparent carotid bruits in the past with normal Dopplers.  She is also been seen by Dr. Geraldo Pitter in May 2019.  History of heart disease in the family including both of her parents, but particularly more in her mother side including her mother, maternal grandmother, and sisters.  When queried more about this she mentioned that significant elevations in cholesterol are present in her sisters as well as her mother.  She has known about high cholesterol for some time.  Most recent labs a month ago showed a direct LDL of 186.  At that  time her total cholesterol was 278, triglycerides 210 and a non-HDL cholesterol of 240.  In the past she is also struggled with high triglycerides, namely 4 months ago her triglycerides were close to 600.  Since then, she has been on numerous medications which were not tolerated and thought to cause myalgias, including atorvastatin, rosuvastatin, pitavastatin, fenofibrate, ezetimibe, Vascepa and niacin. Currently she is not on therapy. I have repeated her lipid study and her total cholesterol now is 404, TG 195, HDL 37 and LDL of 328 (direct 332). 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  She has no known coronary disease history.  The patient does not have symptoms concerning for COVID-19 infection (fever, chills, cough, or new SHORTNESS OF BREATH).    Prior CV studies:   The following studies were reviewed today:  Lipid profile Chart review  PMHx:  Past Medical History:  Diagnosis Date   Acute bronchitis 05/16/2012   Allergic state 05/16/2012   Allergy    Constipation 04/16/2014   Diabetes mellitus without complication ([place])    DM2 (diabetes mellitus, type 2) ([place]) 08/10/2015   GERD (gastroesophageal reflux disease) 06/12/2013   Hip pain, bilateral 08/19/2015   HTN (hypertension)    Hyperglycemia 08/10/2015   Hyperlipidemia    Hypokalemia 11/14/2012   Overweight 08/14/2016   Seasonal allergies     Past Surgical History:  Procedure Laterality Date   CESAREAN SECTION     HEMORRHOID SURGERY     two    FAMHx:  Family History  Problem  Relation Age of Onset   Heart disease Father        Deceased   Hypertension Father    Lung cancer Father    Melanoma Father        Eye Removal   Hyperlipidemia Mother    Hypertension Mother    Colon polyps Mother    Hyperlipidemia Maternal Grandmother    Diabetes Maternal Grandmother    Dementia Maternal Grandmother    Hypertension Sister        x2   Colon polyps Sister     Hyperlipidemia Sister        x2   Colon polyps Sister    Colon cancer Neg Hx    Esophageal cancer Neg Hx    Rectal cancer Neg Hx    Stomach cancer Neg Hx     SOCHx:   reports that she has never smoked. She has never used smokeless tobacco. She reports that she does not drink alcohol or use drugs.  ALLERGIES:  Allergies  Allergen Reactions   Fenofibrate Other (See Comments)    myalgias   Doxycycline Monohydrate     GI Issues.   Niacin And Related     Extreme flushing despite taking it correctly   Statins Other (See Comments)    Livalo, crestor   Tetracyclines & Related Other (See Comments)    GI Issues.    MEDS:  Current Meds  Medication Sig   blood glucose meter kit and supplies KIT Dispense based on patient and insurance preference. Check BS  daily as directed.  DzE11.9   cetirizine (ZYRTEC) 10 MG tablet Take 10 mg by mouth daily as needed.   Evolocumab (REPATHA SURECLICK) 694 MG/ML SOAJ Inject 1 Dose into the skin every 14 (fourteen) days.   fluticasone (FLONASE) 50 MCG/ACT nasal spray Place 2 sprays into the nose daily as needed.   Insulin Pen Needle 32G X 6 MM MISC Inject 1.36m into skin daily Dz E11.9   liraglutide (VICTOZA) 18 MG/3ML SOPN Inject 1.2 mg into the skin daily.   metoprolol succinate (TOPROL-XL) 50 MG 24 hr tablet TAKE 1 & 1/2 TABLET BY MOUTH EVERY MORNING AND 1 TABLET IN THE EVENING WITH OR IMMEDIATELY FOLLOWING A MEAL   omeprazole (PRILOSEC) 20 MG capsule Take 20 mg by mouth daily.   ONE TOUCH ULTRA TEST test strip    ONETOUCH DELICA LANCETS 385IMISC    potassium chloride SA (K-DUR) 20 MEQ tablet Take 1 tablet (20 mEq total) by mouth daily.   triamterene-hydrochlorothiazide (MAXZIDE-25) 37.5-25 MG tablet TAKE 1 TABLET BY MOUTH DAILY.     ROS: Pertinent items noted in HPI and remainder of comprehensive ROS otherwise negative.  Labs/Other Tests and Data Reviewed:    Recent Labs: 12/24/2017: Magnesium 2.2 04/30/2018: Hemoglobin  12.2; Platelets 292.0; TSH 2.24 08/19/2018: ALT 12; BUN 13; Creatinine, Ser 1.01; Potassium 3.8; Sodium 140   Recent Lipid Panel Lab Results  Component Value Date/Time   CHOL 404 (H) 09/08/2018 11:22 AM   CHOL 185 11/29/2013 09:11 AM   TRIG 195 (H) 09/08/2018 11:22 AM   TRIG 160 (H) 11/29/2013 09:11 AM   HDL 37 (L) 09/08/2018 11:22 AM   HDL 37 (L) 11/29/2013 09:11 AM   CHOLHDL 10.9 (H) 09/08/2018 11:22 AM   CHOLHDL 7 08/02/2018 03:44 PM   LDLCALC 328 (H) 09/08/2018 11:22 AM   LDLCALC 116 (H) 11/29/2013 09:11 AM   LDLDIRECT 332 (H) 09/08/2018 11:22 AM   LDLDIRECT 186.0 08/02/2018 03:44 PM  Wt Readings from Last 3 Encounters:  09/07/18 123 lb (55.8 kg)  07/30/18 128 lb (58.1 kg)  04/27/18 140 lb 9.6 oz (63.8 kg)     Exam:    Vital Signs:  BP 104/78    Pulse 98    Ht 5' 3.5" (1.613 m)    Wt 123 lb (55.8 kg)    BMI 21.45 kg/m    General appearance: alert and no distress Lungs: no visual respiratory difficulty Abdomen: normal weight Extremities: extremities normal, atraumatic, no cyanosis or edema Skin: Skin color, texture, turgor normal. No rashes or lesions or no xanthomata Neurologic: Mental status: Alert, oriented, thought content appropriate Psych: Pleasant  ASSESSMENT & PLAN:    1. 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 Family history of premature CAD 3. Statin intolerance  Marissa Guzman has very high LDL cholesterol, but has had elevated triglycerides in the past as well, however, now improved. Findings, in the context of family history of CAD and dyslipidemia in her mother and sisters, is concerning for familial hyperlipidemia. I would recommend that we pursue PCSK9i with Repatha. Will also check calcium score for additional risk stratification.  I would recommend referral to our geneticist for testing for FH - an abnormal result would be helpful to cascade screen her children, sisters and their offspring.  COVID-19 Education: The signs and symptoms of COVID-19 were  discussed with the patient and how to seek care for testing (follow up with PCP or arrange E-visit).  The importance of social distancing was discussed today.  Patient Risk:   After full review of this patients clinical status, I feel that they are at least moderate risk at this time.  Time:   Today, I have spent 25 minutes with the patient with telehealth technology discussing dyslipidemia, cardiac risk, statin intolerance.     Medication Adjustments/Labs and Tests Ordered: Current medicines are reviewed at length with the patient today.  Concerns regarding medicines are outlined above.   Tests Ordered: Orders Placed This Encounter  Procedures   CT CARDIAC SCORING   Lipid panel   LDL cholesterol, direct    Medication Changes: No orders of the defined types were placed in this encounter.   Disposition:  in 4 month(s)  Pixie Casino, MD, [facility], [place] Director of the Advanced Lipid Disorders &  Cardiovascular Risk Reduction Clinic Diplomate of the American Board of Clinical Lipidology Attending Cardiologist  Direct Dial: [phone]   Fax: [phone]  Website:  [email or web address]  Pixie Casino, MD  09/09/2018 8:54 AM

## 2018-09-10 ENCOUNTER — Other Ambulatory Visit: Payer: Self-pay | Admitting: *Deleted

## 2018-09-10 DIAGNOSIS — E7801 Familial hypercholesterolemia: Secondary | ICD-10-CM

## 2018-09-13 ENCOUNTER — Ambulatory Visit: Payer: BLUE CROSS/BLUE SHIELD | Admitting: Family Medicine

## 2018-09-13 DIAGNOSIS — M069 Rheumatoid arthritis, unspecified: Secondary | ICD-10-CM | POA: Diagnosis not present

## 2018-09-13 DIAGNOSIS — M19071 Primary osteoarthritis, right ankle and foot: Secondary | ICD-10-CM | POA: Diagnosis not present

## 2018-09-13 DIAGNOSIS — M79642 Pain in left hand: Secondary | ICD-10-CM | POA: Diagnosis not present

## 2018-09-13 DIAGNOSIS — M79671 Pain in right foot: Secondary | ICD-10-CM | POA: Diagnosis not present

## 2018-09-13 DIAGNOSIS — M79672 Pain in left foot: Secondary | ICD-10-CM | POA: Diagnosis not present

## 2018-09-13 DIAGNOSIS — M19072 Primary osteoarthritis, left ankle and foot: Secondary | ICD-10-CM | POA: Diagnosis not present

## 2018-09-13 DIAGNOSIS — M25571 Pain in right ankle and joints of right foot: Secondary | ICD-10-CM | POA: Diagnosis not present

## 2018-09-13 DIAGNOSIS — M79641 Pain in right hand: Secondary | ICD-10-CM | POA: Diagnosis not present

## 2018-09-13 DIAGNOSIS — M79643 Pain in unspecified hand: Secondary | ICD-10-CM | POA: Diagnosis not present

## 2018-09-13 DIAGNOSIS — M25572 Pain in left ankle and joints of left foot: Secondary | ICD-10-CM | POA: Diagnosis not present

## 2018-09-13 DIAGNOSIS — M1812 Unilateral primary osteoarthritis of first carpometacarpal joint, left hand: Secondary | ICD-10-CM | POA: Diagnosis not present

## 2018-09-13 DIAGNOSIS — M1811 Unilateral primary osteoarthritis of first carpometacarpal joint, right hand: Secondary | ICD-10-CM | POA: Diagnosis not present

## 2018-09-13 DIAGNOSIS — M1990 Unspecified osteoarthritis, unspecified site: Secondary | ICD-10-CM | POA: Diagnosis not present

## 2018-09-13 MED FILL — predniSONE 10 MG TABS: 10 | 30 days supply | Qty: 30 | Fill #0

## 2018-09-21 DIAGNOSIS — M069 Rheumatoid arthritis, unspecified: Secondary | ICD-10-CM | POA: Diagnosis not present

## 2018-09-21 DIAGNOSIS — M79671 Pain in right foot: Secondary | ICD-10-CM | POA: Diagnosis not present

## 2018-09-21 DIAGNOSIS — M1990 Unspecified osteoarthritis, unspecified site: Secondary | ICD-10-CM | POA: Diagnosis not present

## 2018-09-21 DIAGNOSIS — M79643 Pain in unspecified hand: Secondary | ICD-10-CM | POA: Diagnosis not present

## 2018-09-21 MED FILL — FOLIC ACID 1 MG TABS: 1 | 30 days supply | Qty: 30 | Fill #0

## 2018-09-21 MED FILL — METHOTREXATE SODIUM 2.5 MG: 2.5 | 28 days supply | Qty: 16 | Fill #0

## 2018-09-23 ENCOUNTER — Ambulatory Visit
Admission: RE | Admit: 2018-09-23 | Discharge: 2018-09-23 | Disposition: A | Discharge status: Home / Self Care | Payer: BLUE CROSS/BLUE SHIELD | Source: Ambulatory Visit | Attending: Internal Medicine | Admitting: Internal Medicine

## 2018-09-23 ENCOUNTER — Ambulatory Visit (INDEPENDENT_AMBULATORY_CARE_PROVIDER_SITE_OTHER): Payer: BLUE CROSS/BLUE SHIELD | Admitting: Family Medicine

## 2018-09-23 ENCOUNTER — Other Ambulatory Visit: Payer: Self-pay

## 2018-09-23 DIAGNOSIS — E7801 Familial hypercholesterolemia: Secondary | ICD-10-CM

## 2018-09-23 DIAGNOSIS — I10 Essential (primary) hypertension: Secondary | ICD-10-CM

## 2018-09-23 DIAGNOSIS — E7800 Pure hypercholesterolemia, unspecified: Secondary | ICD-10-CM

## 2018-09-23 DIAGNOSIS — N289 Disorder of kidney and ureter, unspecified: Secondary | ICD-10-CM

## 2018-09-23 DIAGNOSIS — M069 Rheumatoid arthritis, unspecified: Secondary | ICD-10-CM

## 2018-09-23 DIAGNOSIS — E119 Type 2 diabetes mellitus without complications: Secondary | ICD-10-CM | POA: Diagnosis not present

## 2018-09-23 MED FILL — VICTOZA 2-PAK 18 MG/3 ML PE: 18 | 60 days supply | Qty: 12 | Fill #2

## 2018-09-26 DIAGNOSIS — M069 Rheumatoid arthritis, unspecified: Secondary | ICD-10-CM | POA: Insufficient documentation

## 2018-09-26 NOTE — Assessment & Plan Note (Signed)
hgba1c acceptable, minimize simple carbs. Increase exercise as tolerated. Continue current meds 

## 2018-09-26 NOTE — Assessment & Plan Note (Signed)
Is following with Dr Debara Pickett of cardiology now and had a cardiac calcium score run today, awaits results.

## 2018-09-26 NOTE — Assessment & Plan Note (Signed)
Hydrate and monitor 

## 2018-09-26 NOTE — Assessment & Plan Note (Signed)
Is following with rheumatology now and doing better on MTX and Folic Acid

## 2018-09-26 NOTE — Assessment & Plan Note (Signed)
Encouraged to check vitals weekly no changes to meds. Encouraged heart healthy diet such as the DASH diet and exercise as tolerated.  

## 2018-09-26 NOTE — Progress Notes (Signed)
Virtual Visit via Video Note  I connected with Marissa Guzman on 09/23/18 at  2:40 PM EDT by a video enabled telemedicine application and verified that I am speaking with the correct person using two identifiers.  Location: Patient: home Provider: office   I discussed the limitations of evaluation and management by telemedicine and the availability of in person appointments. The patient expressed understanding and agreed to proceed. Magdalene Molly, CMA was able to get the patient set up on video visit.     Subjective:    Patient ID: Marissa Guzman, female    DOB: [birth year], 53 y.o.   MRN: [id number]  No chief complaint on file.   HPI Patient is in today for follow up on chronic medical concerns including rheumatoid arthritis, hyperlipidemia, reflux, allergies and more. No recent febrile illness or hospitalizations. She is now established with rheumatology, dr Dorathy Daft and she is feeling better on Methotrexate  and Folic Acid. She is also seeing Dr Debara Pickett of cardiology to help manage her cholesterol. Denies CP/palp/SOB/HA/congestion/fevers/GI or GU c/o. Taking meds as prescribed Past Medical History:  Diagnosis Date  . Acute bronchitis 05/16/2012  . Allergic state 05/16/2012  . Allergy   . Constipation 04/16/2014  . Diabetes mellitus without complication ([place])   . DM2 (diabetes mellitus, type 2) ([place]) 08/10/2015  . GERD (gastroesophageal reflux disease) 06/12/2013  . Hip pain, bilateral 08/19/2015  . HTN (hypertension)   . Hyperglycemia 08/10/2015  . Hyperlipidemia   . Hypokalemia 11/14/2012  . Overweight 08/14/2016  . Seasonal allergies     Past Surgical History:  Procedure Laterality Date  . CESAREAN SECTION    . HEMORRHOID SURGERY     two    Family History  Problem Relation Age of Onset  . Heart disease Father        Deceased  . Hypertension Father   . Lung cancer Father   . Melanoma Father        Eye Removal  . Hyperlipidemia Mother   . Hypertension Mother   . Colon polyps  Mother   . Hyperlipidemia Maternal Grandmother   . Diabetes Maternal Grandmother   . Dementia Maternal Grandmother   . Hypertension Sister        x2  . Colon polyps Sister   . Hyperlipidemia Sister        x2  . Colon polyps Sister   . Colon cancer Neg Hx   . Esophageal cancer Neg Hx   . Rectal cancer Neg Hx   . Stomach cancer Neg Hx     Social History   Socioeconomic History  . Marital status: Married    Spouse name: Not on file  . Number of children: Not on file  . Years of education: Not on file  . Highest education level: Not on file  Occupational History  . Not on file  Social Needs  . Financial resource strain: Not on file  . Food insecurity    Worry: Not on file    Inability: Not on file  . Transportation needs    Medical: Not on file    Non-medical: Not on file  Tobacco Use  . Smoking status: Never Smoker  . Smokeless tobacco: Never Used  Substance and Sexual Activity  . Alcohol use: No  . Drug use: No  . 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 Physical activity  Days per week: Not on file    Minutes per session: Not on file  . Stress: Not on file  Relationships  . Social Herbalist on phone: Not on file    Gets together: Not on file    Attends religious service: Not on file    Active member of club or organization: Not on file    Attends meetings of clubs or organizations: Not on file    Relationship status: Not on file  . Intimate partner violence    Fear of current or ex partner: Not on file    Emotionally abused: Not on file    Physically abused: Not on file    Forced sexual activity: Not on file  Other Topics Concern  . Not on file  Social History Narrative  . Not on file    Outpatient Medications Prior to Visit  Medication Sig Dispense Refill  . blood glucose meter kit and supplies KIT Dispense based on patient and  insurance preference. Check BS  daily as directed.  DzE11.9 1 each 0  . cetirizine (ZYRTEC) 10 MG tablet Take 10 mg by mouth daily as needed.    . Evolocumab (REPATHA SURECLICK) 562 MG/ML SOAJ Inject 1 Dose into the skin every 14 (fourteen) days.    . fluticasone (FLONASE) 50 MCG/ACT nasal spray Place 2 sprays into the nose daily as needed.    . Insulin Pen Needle 32G X 6 MM MISC Inject 1.69m into skin daily Dz E11.9 100 each 1  . liraglutide (VICTOZA) 18 MG/3ML SOPN Inject 1.2 mg into the skin daily.    . metoprolol succinate (TOPROL-XL) 50 MG 24 hr tablet TAKE 1 & 1/2 TABLET BY MOUTH EVERY MORNING AND 1 TABLET IN THE EVENING WITH OR IMMEDIATELY FOLLOWING A MEAL 75 tablet 0  . omeprazole (PRILOSEC) 20 MG capsule Take 20 mg by mouth daily.    . ONE TOUCH ULTRA TEST test strip   0  . ONETOUCH DELICA LANCETS 313YMISC   0  . potassium chloride SA (K-DUR) 20 MEQ tablet Take 1 tablet (20 mEq total) by mouth daily. 30 tablet 2  . triamterene-hydrochlorothiazide (MAXZIDE-25) 37.5-25 MG tablet TAKE 1 TABLET BY MOUTH DAILY. 90 tablet 1   No facility-administered medications prior to visit.     Allergies  Allergen Reactions  . Fenofibrate Other (See Comments)    myalgias  . Doxycycline Monohydrate     GI Issues.  . Niacin And Related     Extreme flushing despite taking it correctly  . Statins Other (See Comments)    Livalo, crestor  . Tetracyclines & Related Other (See Comments)    GI Issues.    Review of Systems  Constitutional: Negative for fever and malaise/fatigue.  HENT: Negative for congestion.   Eyes: Negative for blurred vision.  Respiratory: Negative for shortness of breath.   Cardiovascular: Negative for chest pain, palpitations and leg swelling.  Gastrointestinal: Negative for abdominal pain, blood in stool and nausea.  Genitourinary: Negative for dysuria and frequency.  Musculoskeletal: Negative for falls.  Skin: Negative for rash.  Neurological: Negative for dizziness, loss  of consciousness and headaches.  Endo/Heme/Allergies: Negative for environmental allergies.  Psychiatric/Behavioral: Negative for depression. The patient is not nervous/anxious.        Objective:    Physical Exam Constitutional:      Appearance: Normal appearance. She is not ill-appearing.  HENT:     Head: Normocephalic and atraumatic.     Nose: Nose  normal.  Pulmonary:     Effort: Pulmonary effort is normal.  Neurological:     Mental Status: She is alert and oriented to person, place, and time.  Psychiatric:        Mood and Affect: Mood normal.        Behavior: Behavior normal.     There were no vitals taken for this visit. Wt Readings from Last 3 Encounters:  09/07/18 123 lb (55.8 kg)  07/30/18 128 lb (58.1 kg)  04/27/18 140 lb 9.6 oz (63.8 kg)    Diabetic Foot Exam - Simple   No data filed     Lab Results  Component Value Date   WBC 8.1 04/30/2018   HGB 12.2 04/30/2018   HCT 36.1 04/30/2018   PLT 292.0 04/30/2018   GLUCOSE 87 08/19/2018   CHOL 404 (H) 09/08/2018   TRIG 195 (H) 09/08/2018   HDL 37 (L) 09/08/2018   LDLDIRECT 332 (H) 09/08/2018   LDLCALC 328 (H) 09/08/2018   ALT 12 08/19/2018   AST 18 08/19/2018   NA 140 08/19/2018   K 3.8 08/19/2018   CL 101 08/19/2018   CREATININE 1.01 08/19/2018   BUN 13 08/19/2018   CO2 29 08/19/2018   TSH 2.24 04/30/2018   HGBA1C 6.5 08/02/2018    Lab Results  Component Value Date   TSH 2.24 04/30/2018   Lab Results  Component Value Date   WBC 8.1 04/30/2018   HGB 12.2 04/30/2018   HCT 36.1 04/30/2018   MCV 90.5 04/30/2018   PLT 292.0 04/30/2018   Lab Results  Component Value Date   NA 140 08/19/2018   K 3.8 08/19/2018   CO2 29 08/19/2018   GLUCOSE 87 08/19/2018   BUN 13 08/19/2018   CREATININE 1.01 08/19/2018   BILITOT 1.2 08/19/2018   ALKPHOS 65 08/19/2018   AST 18 08/19/2018   ALT 12 08/19/2018   PROT 6.9 08/19/2018   ALBUMIN 4.3 08/19/2018   CALCIUM 10.3 08/19/2018   GFR 57.29 (L)  08/19/2018   Lab Results  Component Value Date   CHOL 404 (H) 09/08/2018   Lab Results  Component Value Date   HDL 37 (L) 09/08/2018   Lab Results  Component Value Date   LDLCALC 328 (H) 09/08/2018   Lab Results  Component Value Date   TRIG 195 (H) 09/08/2018   Lab Results  Component Value Date   CHOLHDL 10.9 (H) 09/08/2018   Lab Results  Component Value Date   HGBA1C 6.5 08/02/2018       Assessment & Plan:   Problem List Items Addressed This Visit    Hypertension, benign essential, goal below 140/90    Encouraged to check vitals weekly no changes to meds. Encouraged heart healthy diet such as the DASH diet and exercise as tolerated.       Hypercholesterolemia    Is following with Dr Debara Pickett of cardiology now and had a cardiac calcium score run today, awaits results.       DM2 (diabetes mellitus, type 2) ([facility])    hgba1c acceptable, minimize simple carbs. Increase exercise as tolerated. Continue current meds      Renal insufficiency    Hydrate and monitor      Rheumatoid arthritis ([place])    Is following with rheumatology now and doing better on MTX and Folic Acid          I am having Marissa Guzman maintain her fluticasone, cetirizine, omeprazole, blood glucose meter kit and supplies, ONE  TOUCH ULTRA TEST, OneTouch Delica Lancets 18F, Insulin Pen Needle, triamterene-hydrochlorothiazide, potassium chloride SA, metoprolol succinate, Victoza, and Repatha SureClick.  No orders of the defined types were placed in this encounter. I discussed the assessment and treatment plan with the patient. The patient was provided an opportunity to ask questions and all were answered. The patient agreed with the plan and demonstrated an understanding of the instructions.   The patient was advised to call back or seek an in-person evaluation if the symptoms worsen or if the condition fails to improve as anticipated.  I provided 25 minutes of non-face-to-face time during this  encounter.   Penni Homans, MD

## 2018-09-27 ENCOUNTER — Telehealth: Payer: Self-pay | Admitting: Internal Medicine

## 2018-09-27 MED ORDER — REPATHA SURECLICK 140 MG/ML ~~LOC~~ SOAJ: 1.0000 | SUBCUTANEOUS | 11 refills | Status: DC

## 2018-09-27 MED FILL — REPATHA SURECLICK 140 MG/ML: 140 | 28 days supply | Qty: 2 | Fill #0

## 2018-09-27 NOTE — Telephone Encounter (Signed)
Rx sent to [facility] per patient request

## 2018-09-27 NOTE — Telephone Encounter (Signed)
(  Please note: Any approval for Repatha SureClick autoinjector is only authorized for the following NDCs: Repatha 140mg /mL SureClick autoinjector - [place] [id number]; [id number])  PA for Repatha Sureclick approved from 22/48/2500 through 09/26/2019  Patient notified via [place]

## 2018-09-27 NOTE — Addendum Note (Signed)
Addended by: Fidel Levy on: 09/27/2018 10:42 AM   Modules accepted: Orders

## 2018-10-04 ENCOUNTER — Other Ambulatory Visit: Payer: Self-pay | Admitting: Cardiology

## 2018-10-04 DIAGNOSIS — I10 Essential (primary) hypertension: Secondary | ICD-10-CM

## 2018-10-04 MED FILL — TRIAMTERENE-HCTZ 37.5-25 MG: 37.5-25 | 90 days supply | Qty: 90 | Fill #1

## 2018-10-04 MED FILL — POTASSIUM CHLORIDE CRYS ER: 20 | 30 days supply | Qty: 30 | Fill #2

## 2018-10-07 ENCOUNTER — Encounter: Payer: Self-pay | Admitting: Family Medicine

## 2018-10-07 DIAGNOSIS — I10 Essential (primary) hypertension: Secondary | ICD-10-CM

## 2018-10-08 MED ORDER — METOPROLOL SUCCINATE ER 50 MG PO TB24: ORAL_TABLET | ORAL | 5 refills | Status: DC

## 2018-10-08 MED FILL — METOPROLOL SUCCINATE ER 50: 50 | 30 days supply | Qty: 75 | Fill #0

## 2018-10-21 DIAGNOSIS — M069 Rheumatoid arthritis, unspecified: Secondary | ICD-10-CM | POA: Diagnosis not present

## 2018-10-21 DIAGNOSIS — Z79899 Other long term (current) drug therapy: Secondary | ICD-10-CM | POA: Diagnosis not present

## 2018-10-21 DIAGNOSIS — M79671 Pain in right foot: Secondary | ICD-10-CM | POA: Diagnosis not present

## 2018-10-21 DIAGNOSIS — M79643 Pain in unspecified hand: Secondary | ICD-10-CM | POA: Diagnosis not present

## 2018-10-21 MED FILL — predniSONE 10 MG TABS: 10 | 30 days supply | Qty: 30 | Fill #0

## 2018-10-21 MED FILL — METHOTREXATE SODIUM 2.5 MG: 2.5 | 28 days supply | Qty: 24 | Fill #0

## 2018-10-21 MED FILL — FOLIC ACID 1 MG TABS: 1 | 30 days supply | Qty: 30 | Fill #0

## 2018-10-25 MED FILL — REPATHA SURECLICK 140 MG/ML: 140 | 28 days supply | Qty: 2 | Fill #1

## 2018-11-01 ENCOUNTER — Other Ambulatory Visit: Payer: Self-pay | Admitting: *Deleted

## 2018-11-01 ENCOUNTER — Other Ambulatory Visit: Payer: Self-pay

## 2018-11-01 ENCOUNTER — Ambulatory Visit (INDEPENDENT_AMBULATORY_CARE_PROVIDER_SITE_OTHER): Payer: BLUE CROSS/BLUE SHIELD | Admitting: Family Medicine

## 2018-11-01 DIAGNOSIS — I10 Essential (primary) hypertension: Secondary | ICD-10-CM

## 2018-11-01 DIAGNOSIS — E7800 Pure hypercholesterolemia, unspecified: Secondary | ICD-10-CM

## 2018-11-01 DIAGNOSIS — E119 Type 2 diabetes mellitus without complications: Secondary | ICD-10-CM

## 2018-11-08 ENCOUNTER — Other Ambulatory Visit: Payer: Self-pay | Admitting: Family Medicine

## 2018-11-08 MED FILL — METOPROLOL SUCCINATE ER 50: 50 | 30 days supply | Qty: 75 | Fill #1

## 2018-11-08 MED FILL — POTASSIUM CHLORIDE CRYS ER: 20 | 30 days supply | Qty: 30 | Fill #0

## 2018-11-11 MED FILL — METHOTREXATE SODIUM 2.5 MG: 2.5 | 28 days supply | Qty: 24 | Fill #1

## 2018-11-15 MED FILL — predniSONE 10 MG TABS: 10 | 30 days supply | Qty: 30 | Fill #1

## 2018-11-15 MED FILL — FOLIC ACID 1 MG TABS: 1 | 30 days supply | Qty: 30 | Fill #1

## 2018-11-19 MED FILL — REPATHA SURECLICK 140 MG/ML: 140 | 28 days supply | Qty: 2 | Fill #2

## 2018-11-23 MED FILL — VICTOZA 2-PAK 18 MG/3 ML PE: 18 | 60 days supply | Qty: 12 | Fill #3

## 2018-11-25 ENCOUNTER — Other Ambulatory Visit: Payer: Self-pay

## 2018-11-25 ENCOUNTER — Ambulatory Visit: Payer: BLUE CROSS/BLUE SHIELD | Admitting: Genetic Counselor

## 2018-11-25 DIAGNOSIS — E7801 Familial hypercholesterolemia: Secondary | ICD-10-CM

## 2018-12-05 NOTE — Progress Notes (Signed)
Pre-test Genetic Consult notes   Referral Reason Marissa Guzman is suspected to have probable familial hypercholesterolemia and was referred for genetic consult and testing of familial hypercholesterolemia (FH) by Dr. Rennis Golden.  Genetic Consultation Notes  We walked through the risk factors that can lead to hypercholesterolemia. I explained to Marissa Guzman that the characteristic features of a genetic condition include absence of risk factors that can lead to elevated LDL-C, early age of presentation, increased disease severity and family history of the condition. The clinical manifestations of FH were also reviewed. She denies having xanthomas, corneal arcus or a heart attack.  I discussed the molecular pathogenesis of FH. I informed her that FH is primarily caused by pathogenic variants in three genes, namely APOB, LDLR and PCSK9. These pathogenic variants impact LDLR synthesis, degradation and recycling in cells leading to elevated LDL-C levels. We then walked through autosomal dominant inheritance pattern and viewed pedigree of families with heterozygous FH (HeFH) and homozygous FH (HoFH). Explained to her that digenic or compound heterozygous mutations in APOB, LDLR and PCSK9 genes can cause HoFH.   We reviewed the likely outcomes of FH genetic testing. Based on the diagnostic criteria for FH, yields can range from 50%-90%. A positive yield is observed in  ~63% of patients with a definite clinical diagnosis of FH. I also made clear to her that a negative test does not exclude a genetic basis for FH. Polygenic inheritance where more than an average number of common variants, each with small effect, can increase plasma lipid levels. Additonally, limitations in current genetic testing methodology can produce a negative result. Variants of unknown significance (VUS) can be seen in some cases. I told her that typically a VUS is so classified if the variant is not well understood as very few individuals have been  reported to harbor this variant or its role in gene function has not been elucidated. Screening other first-degree family members by genetic testing was also discussed. Additionally, we briefly touched upon the molecular basis of the different treatment modalities that are currently available.  Her medical and 4-generation family history was obtained. 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 She states that at age 50 she underwent routine blood work and reportedly had elevated cholesterol of 399. She tells me that inspite of being treated with statins and other cholesterol lowering medication, as well as maintaining a healthy lifestyle, her lipids have continued to stay elevated. Finally, her PCP referred her to the lipid clinic with Dr. Rennis Golden for further management.  Risk Factors Marissa Guzman reports being diagnosed with fatty liver disease at 37. She denies having hypothyroidism or renal dysfunction that can also lead to FH. She was diagnosed with diabetes at 73 and hypertension at 23 when she was pregnant. Reports that she had to switch from metformin to Victoza due to renal issues and is now under control as is her hypertension. She also has limited her food intake and has lost 45 lbs since Feb 2020.  Family history Marissa Guzman(III.6) is the youngest triplet, all girls, each born 30 minutes apart. Both her older sisters (III.4, III.5) have hypertension, but only the middle sister (III.5) has elevated cholesterol and is on medication. Marissa Guzman has a healthy 44 year-old son (IV.3). Her sisters' children are also in good health (IV.1-IV.3). She is not aware if they have had lipid screening. She assumes her son has normal lipids as  he recently obtained a life Set designer.  Marissa Guzman's father (II.4) had heart issues all his life and had a prior heart attack. She tells me that her  stepmother heard him struggling downstairs. She went to check on him and found him dead. She is not sure if an autopsy was performed. He died at 50. He had three sisters (II.1-II.3). Marissa Guzman is not aware of heart disease amongst her paternal aunts. Her paternal grandfather (I.1) died of heart issues in his 61s.  Marissa Guzman's mother is now 65 and was found to have high cholesterol in her 53s or 60s. She was placed on statins and also moved towards a healthy lifestyle. Marissa Guzman reports that her mother's lipids have normalized for the last 2-3 years. There is no history of heart disease amongst her two brothers (II.6, II.7).   Impression and Plans  In summary, Marissa Guzman was diagnosed with hyperlipidemia at age 64 in the absence of typical risk factors for hyperlipidemia. There is a significant family history of CAD and sudden death in a first-degree relative (father). It is likely that Marissa Guzman has a genetic condition that is likely inherited from her father as her mother's lipid levels improved with lifestyle modifications and statins.  In light of her presentation and dramatic family history of sudden death at a young age in her father, genetic testing is highly recommended. Genetic testing should evaluate the major genes implicated in familial hypercholesterolemia. Since this is an autosomal dominant condition, her son is at a 50% risk of inheriting it. If she tests positive, her son can be tested later to determine if he has inherited the familial pathogenic variant. Marissa Guzman verbalized understanding of this.  In addition, we discussed the protections afforded by the Genetic Information Non-Discrimination Act (GINA). I explained to her that GINA protects her from losing her employment or health insurance based on her genotype. However, these protections do not cover life insurance and disability. She verbalized understanding of this.  Please note that the patient has not been counseled in this visit on personal, cultural or  ethical issues that she may face due to his heart condition.   Plans Marissa Guzman would like to pursue genetic testing. Blood was drawn today and sent for testing.   Lattie Corns, Ph.D, [facility] Clinical Molecular Geneticist

## 2018-12-07 MED FILL — POTASSIUM CHLORIDE CRYS ER: 20 | 30 days supply | Qty: 30 | Fill #1

## 2018-12-07 MED FILL — METOPROLOL SUCCINATE ER 50: 50 | 30 days supply | Qty: 75 | Fill #2

## 2018-12-08 ENCOUNTER — Ambulatory Visit: Payer: BLUE CROSS/BLUE SHIELD

## 2018-12-08 ENCOUNTER — Other Ambulatory Visit: Payer: Self-pay | Admitting: Internal Medicine

## 2018-12-08 DIAGNOSIS — E7801 Familial hypercholesterolemia: Secondary | ICD-10-CM

## 2018-12-10 ENCOUNTER — Other Ambulatory Visit (INDEPENDENT_AMBULATORY_CARE_PROVIDER_SITE_OTHER): Payer: BLUE CROSS/BLUE SHIELD

## 2018-12-10 ENCOUNTER — Other Ambulatory Visit: Payer: Self-pay

## 2018-12-10 ENCOUNTER — Ambulatory Visit (INDEPENDENT_AMBULATORY_CARE_PROVIDER_SITE_OTHER): Payer: BLUE CROSS/BLUE SHIELD | Admitting: *Deleted

## 2018-12-10 DIAGNOSIS — I10 Essential (primary) hypertension: Secondary | ICD-10-CM

## 2018-12-10 DIAGNOSIS — E119 Type 2 diabetes mellitus without complications: Secondary | ICD-10-CM

## 2018-12-10 DIAGNOSIS — Z23 Encounter for immunization: Secondary | ICD-10-CM

## 2018-12-10 DIAGNOSIS — E7800 Pure hypercholesterolemia, unspecified: Secondary | ICD-10-CM

## 2018-12-10 LAB — COMPREHENSIVE METABOLIC PANEL
ALT: 15 U/L (ref 0–35)
AST: 21 U/L (ref 0–37)
Albumin: 4.4 g/dL (ref 3.5–5.2)
Alkaline Phosphatase: 75 U/L (ref 39–117)
BUN: 20 mg/dL (ref 6–23)
CO2: 31 mEq/L (ref 19–32)
Calcium: 10.4 mg/dL (ref 8.4–10.5)
Chloride: 99 mEq/L (ref 96–112)
Creatinine, Ser: 0.98 mg/dL (ref 0.40–1.20)
GFR: 59.25 mL/min — ABNORMAL LOW (ref >60.00)
Glucose, Bld: 101 mg/dL — ABNORMAL HIGH (ref 70–99)
Potassium: 4 mEq/L (ref 3.5–5.1)
Sodium: 139 mEq/L (ref 135–145)
Total Bilirubin: 1.1 mg/dL (ref 0.2–1.2)
Total Protein: 6.7 g/dL (ref 6.0–8.3)

## 2018-12-10 LAB — LIPID PANEL
Cholesterol: 160 mg/dL (ref 0–200)
HDL: 51.2 mg/dL (ref >39.00)
NonHDL: 108.55
Total CHOL/HDL Ratio: 3
Triglycerides: 205 mg/dL — ABNORMAL HIGH (ref 0.0–149.0)
VLDL: 41 mg/dL — ABNORMAL HIGH (ref 0.0–40.0)

## 2018-12-10 LAB — LDL CHOLESTEROL, DIRECT: Direct LDL: 88 mg/dL

## 2018-12-10 LAB — HEMOGLOBIN A1C: Hgb A1c MFr Bld: 6.3 % (ref 4.6–6.5)

## 2018-12-10 NOTE — Progress Notes (Signed)
Patient here today for first flu and first shingles vaccine.  Flu vaccine give in right deltoid and shingles vaccine given in left deltoid.  Patient tolerated well

## 2018-12-20 MED FILL — REPATHA SURECLICK 140 MG/ML: 140 | 28 days supply | Qty: 2 | Fill #3

## 2018-12-21 ENCOUNTER — Other Ambulatory Visit: Payer: Self-pay | Admitting: *Deleted

## 2018-12-21 DIAGNOSIS — M069 Rheumatoid arthritis, unspecified: Secondary | ICD-10-CM | POA: Diagnosis not present

## 2018-12-21 DIAGNOSIS — M79671 Pain in right foot: Secondary | ICD-10-CM | POA: Diagnosis not present

## 2018-12-21 DIAGNOSIS — M79643 Pain in unspecified hand: Secondary | ICD-10-CM | POA: Diagnosis not present

## 2018-12-21 DIAGNOSIS — Z79899 Other long term (current) drug therapy: Secondary | ICD-10-CM | POA: Diagnosis not present

## 2018-12-21 MED ORDER — INSULIN PEN NEEDLE 32G X 6 MM MISC: 1 refills | Status: DC

## 2018-12-21 MED FILL — METHOTREXATE SODIUM 2.5 MG: 2.5 | 30 days supply | Qty: 32 | Fill #0

## 2018-12-22 MED FILL — FOLIC ACID 1 MG TABS: 1 | 30 days supply | Qty: 30 | Fill #0

## 2018-12-27 ENCOUNTER — Other Ambulatory Visit: Payer: Self-pay

## 2018-12-27 ENCOUNTER — Encounter: Payer: Self-pay | Admitting: Family Medicine

## 2018-12-27 ENCOUNTER — Ambulatory Visit (INDEPENDENT_AMBULATORY_CARE_PROVIDER_SITE_OTHER): Payer: BLUE CROSS/BLUE SHIELD | Admitting: Family Medicine

## 2018-12-27 DIAGNOSIS — E119 Type 2 diabetes mellitus without complications: Secondary | ICD-10-CM | POA: Diagnosis not present

## 2018-12-27 DIAGNOSIS — E1159 Type 2 diabetes mellitus with other circulatory complications: Secondary | ICD-10-CM

## 2018-12-27 DIAGNOSIS — I10 Essential (primary) hypertension: Secondary | ICD-10-CM | POA: Diagnosis not present

## 2018-12-27 DIAGNOSIS — N289 Disorder of kidney and ureter, unspecified: Secondary | ICD-10-CM

## 2018-12-27 DIAGNOSIS — E7800 Pure hypercholesterolemia, unspecified: Secondary | ICD-10-CM

## 2018-12-27 DIAGNOSIS — I152 Hypertension secondary to endocrine disorders: Secondary | ICD-10-CM

## 2018-12-29 DIAGNOSIS — I10 Essential (primary) hypertension: Secondary | ICD-10-CM | POA: Insufficient documentation

## 2018-12-29 DIAGNOSIS — E1159 Type 2 diabetes mellitus with other circulatory complications: Secondary | ICD-10-CM | POA: Insufficient documentation

## 2018-12-29 DIAGNOSIS — I152 Hypertension secondary to endocrine disorders: Secondary | ICD-10-CM | POA: Insufficient documentation

## 2018-12-29 NOTE — Assessment & Plan Note (Signed)
Hydrate and monitor 

## 2018-12-29 NOTE — Assessment & Plan Note (Signed)
Monitor vitals and report concerns. Encouraged heart healthy diet such as the DASH diet and exercise as tolerated.

## 2018-12-29 NOTE — Assessment & Plan Note (Signed)
hgba1c acceptable, minimize simple carbs. Increase exercise as tolerated. Continue current meds 

## 2018-12-29 NOTE — Progress Notes (Signed)
Subjective:    Patient ID: Marissa Guzman, female    DOB: 1965-04-04, 53 y.o.   MRN: [id number]  Chief Complaint  Patient presents with  . Hypertension    Pt is doing well with no complaints. She does not check sugars at home due to pain and bruising. Pt would like to try different option to check sugars   . Diabetes    HPI Patient is in today for chronic medical concerns including hyperlipidemia, hypertension, diabetes and more. She feels well, no recent febrile concerns or hospitalizations. She is staying active, maintaining quarantine and eating well. Denies CP/palp/SOB/HA/congestion/fevers/GI or GU c/o. Taking meds as prescribed  Past Medical History:  Diagnosis Date  . Acute bronchitis 05/16/2012  . Allergic state 05/16/2012  . Allergy   . Constipation 04/16/2014  . Diabetes mellitus without complication ([place])   . DM2 (diabetes mellitus, type 2) ([place]) 08/10/2015  . GERD (gastroesophageal reflux disease) 06/12/2013  . Hip pain, bilateral 08/19/2015  . HTN (hypertension)   . Hyperglycemia 08/10/2015  . Hyperlipidemia   . Hypokalemia 11/14/2012  . Overweight 08/14/2016  . Seasonal allergies     Past Surgical History:  Procedure Laterality Date  . CESAREAN SECTION    . HEMORRHOID SURGERY     two    Family History  Problem Relation Age of Onset  . Heart disease Father        Deceased  . Hypertension Father   . Lung cancer Father   . Melanoma Father        Eye Removal  . Hyperlipidemia Mother   . Hypertension Mother   . Colon polyps Mother   . Hyperlipidemia Maternal Grandmother   . Diabetes Maternal Grandmother   . Dementia Maternal Grandmother   . Hypertension Sister        x2  . Colon polyps Sister   . Hyperlipidemia Sister        x2  . Colon polyps Sister   . Colon cancer Neg Hx   . Esophageal cancer Neg Hx   . Rectal cancer Neg Hx   . Stomach cancer Neg Hx     Social History   Socioeconomic History  . Marital status: Married    Spouse name: Not on file   . Number of children: Not on file  . Years of education: Not on file  . Highest education level: Not on file  Occupational History  . Not on file  Social Needs  . Financial resource strain: Not on file  . Food insecurity    Worry: Not on file    Inability: Not on file  . Transportation needs    Medical: Not on file    Non-medical: Not on file  Tobacco Use  . Smoking status: Never Smoker  . Smokeless tobacco: Never Used  Substance and Sexual Activity  . Alcohol use: No  . Drug use: No  . 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 Physical activity    Days per week: Not on file    Minutes per session: Not on file  . Stress: Not on file  Relationships  . Social Herbalist on phone: Not on file    Gets together: Not on file    Attends religious service: Not on file    Active member of club or organization: Not on file    Attends meetings  of clubs or organizations: Not on file    Relationship status: Not on file  . Intimate partner violence    Fear of current or ex partner: Not on file    Emotionally abused: Not on file    Physically abused: Not on file    Forced sexual activity: Not on file  Other Topics Concern  . Not on file  Social History Narrative  . Not on file    Outpatient Medications Prior to Visit  Medication Sig Dispense Refill  . blood glucose meter kit and supplies KIT Dispense based on patient and insurance preference. Check BS  daily as directed.  DzE11.9 1 each 0  . cetirizine (ZYRTEC) 10 MG tablet Take 10 mg by mouth daily as needed.    . Evolocumab (REPATHA SURECLICK) 510 MG/ML SOAJ Inject 1 Dose into the skin every 14 (fourteen) days. 2 pen 11  . fluticasone (FLONASE) 50 MCG/ACT nasal spray Place 2 sprays into the nose daily as needed.    . folic acid (FOLVITE) 1 MG tablet     . Insulin Pen Needle 32G X 6 MM MISC Inject 1.25m into skin  daily Dz E11.9 100 each 1  . liraglutide (VICTOZA) 18 MG/3ML SOPN Inject 1.2 mg into the skin daily.    .Marland KitchenMETHOTREXATE, ANTI-RHEUMATIC, PO     . metoprolol succinate (TOPROL-XL) 50 MG 24 hr tablet TAKE 1 & 1/2 TABLET BY MOUTH EVERY MORNING AND 1 TABLET IN THE EVENING WITH OR IMMEDIATELY FOLLOWING A MEAL 75 tablet 5  . omeprazole (PRILOSEC) 20 MG capsule Take 20 mg by mouth daily.    . ONE TOUCH ULTRA TEST test strip   0  . ONETOUCH DELICA LANCETS 325EMISC   0  . potassium chloride SA (K-DUR) 20 MEQ tablet TAKE 1 TABLET (20 MEQ TOTAL) BY MOUTH DAILY. 30 tablet 2  . triamterene-hydrochlorothiazide (MAXZIDE-25) 37.5-25 MG tablet TAKE 1 TABLET BY MOUTH DAILY. 90 tablet 1  . predniSONE 5 MG TBEC      No facility-administered medications prior to visit.     Allergies  Allergen Reactions  . Fenofibrate Other (See Comments)    myalgias  . Doxycycline Monohydrate     GI Issues.  . Niacin And Related     Extreme flushing despite taking it correctly  . Statins Other (See Comments)    Livalo, crestor  . Tetracyclines & Related Other (See Comments)    GI Issues.    Review of Systems  Constitutional: Negative for fever and malaise/fatigue.  HENT: Negative for congestion.   Eyes: Negative for blurred vision.  Respiratory: Negative for shortness of breath.   Cardiovascular: Negative for chest pain, palpitations and leg swelling.  Gastrointestinal: Negative for abdominal pain, blood in stool and nausea.  Genitourinary: Negative for dysuria and frequency.  Musculoskeletal: Negative for falls.  Skin: Negative for rash.  Neurological: Negative for dizziness, loss of consciousness and headaches.  Endo/Heme/Allergies: Negative for environmental allergies.  Psychiatric/Behavioral: Negative for depression. The patient is not nervous/anxious.        Objective:    Physical Exam Constitutional:      Appearance: Normal appearance.  HENT:     Head: Normocephalic and atraumatic.  Eyes:      General:        Right eye: No discharge.        Left eye: No discharge.  Pulmonary:     Effort: Pulmonary effort is normal.  Neurological:     Mental Status: She is alert  and oriented to person, place, and time.  Psychiatric:        Mood and Affect: Mood normal.        Behavior: Behavior normal.     Ht 5' 3.5" (1.613 m)   BMI 21.45 kg/m  Wt Readings from Last 3 Encounters:  09/07/18 123 lb (55.8 kg)  07/30/18 128 lb (58.1 kg)  04/27/18 140 lb 9.6 oz (63.8 kg)    Diabetic Foot Exam - Simple   No data filed     Lab Results  Component Value Date   WBC 8.1 04/30/2018   HGB 12.2 04/30/2018   HCT 36.1 04/30/2018   PLT 292.0 04/30/2018   GLUCOSE 101 (H) 12/10/2018   CHOL 160 12/10/2018   TRIG 205.0 (H) 12/10/2018   HDL 51.20 12/10/2018   LDLDIRECT 88.0 12/10/2018   LDLCALC 328 (H) 09/08/2018   ALT 15 12/10/2018   AST 21 12/10/2018   NA 139 12/10/2018   K 4.0 12/10/2018   CL 99 12/10/2018   CREATININE 0.98 12/10/2018   BUN 20 12/10/2018   CO2 31 12/10/2018   TSH 2.24 04/30/2018   HGBA1C 6.3 12/10/2018    Lab Results  Component Value Date   TSH 2.24 04/30/2018   Lab Results  Component Value Date   WBC 8.1 04/30/2018   HGB 12.2 04/30/2018   HCT 36.1 04/30/2018   MCV 90.5 04/30/2018   PLT 292.0 04/30/2018   Lab Results  Component Value Date   NA 139 12/10/2018   K 4.0 12/10/2018   CO2 31 12/10/2018   GLUCOSE 101 (H) 12/10/2018   BUN 20 12/10/2018   CREATININE 0.98 12/10/2018   BILITOT 1.1 12/10/2018   ALKPHOS 75 12/10/2018   AST 21 12/10/2018   ALT 15 12/10/2018   PROT 6.7 12/10/2018   ALBUMIN 4.4 12/10/2018   CALCIUM 10.4 12/10/2018   GFR 59.25 (L) 12/10/2018   Lab Results  Component Value Date   CHOL 160 12/10/2018   Lab Results  Component Value Date   HDL 51.20 12/10/2018   Lab Results  Component Value Date   LDLCALC 328 (H) 09/08/2018   Lab Results  Component Value Date   TRIG 205.0 (H) 12/10/2018   Lab Results  Component Value  Date   CHOLHDL 3 12/10/2018   Lab Results  Component Value Date   HGBA1C 6.3 12/10/2018       Assessment & Plan:   Problem List Items Addressed This Visit    Hypertension, benign essential, goal below 140/90    Monitor vitals and report concerns. Encouraged heart healthy diet such as the DASH diet and exercise as tolerated.       Hypercholesterolemia    Has had a great response to [place] and has an appt for follow up with lipid clinic soon. They may consider Vascepa to address her lingering trouble with triglycerides. Continue to stay active and minimize simple carbs and processed foods.       DM2 (diabetes mellitus, type 2) (HCC)    hgba1c acceptable, minimize simple carbs. Increase exercise as tolerated. Continue current meds      Renal insufficiency    Hydrate and monitor      Hypertension associated with diabetes ([place])      I am having Malarie B. Trinidad maintain her fluticasone, cetirizine, omeprazole, blood glucose meter kit and supplies, ONE TOUCH ULTRA TEST, OneTouch Delica Lancets 86V, triamterene-hydrochlorothiazide, Victoza, Repatha SureClick, metoprolol succinate, potassium chloride SA, Insulin Pen Needle, folic acid, (METHOTREXATE, ANTI-RHEUMATIC, PO),  and predniSONE.  No orders of the defined types were placed in this encounter.    Penni Homans, MD

## 2018-12-29 NOTE — Assessment & Plan Note (Signed)
Has had a great response to Repatha and has an appt for follow up with lipid clinic soon. They may consider Vascepa to address her lingering trouble with triglycerides. Continue to stay active and minimize simple carbs and processed foods.

## 2019-01-03 ENCOUNTER — Other Ambulatory Visit: Payer: Self-pay | Admitting: Family Medicine

## 2019-01-03 MED FILL — TRIAMTERENE-HCTZ 37.5-25 MG: 37.5-25 | 90 days supply | Qty: 90 | Fill #0

## 2019-01-03 MED FILL — METOPROLOL SUCCINATE ER 50: 50 | 30 days supply | Qty: 75 | Fill #3

## 2019-01-03 MED FILL — POTASSIUM CHLORIDE CRYS ER: 20 | 30 days supply | Qty: 30 | Fill #2

## 2019-01-17 DIAGNOSIS — E7801 Familial hypercholesterolemia: Secondary | ICD-10-CM | POA: Diagnosis not present

## 2019-01-18 MED FILL — REPATHA SURECLICK 140 MG/ML: 140 | 28 days supply | Qty: 2 | Fill #4

## 2019-01-18 MED FILL — METHOTREXATE SODIUM 2.5 MG: 2.5 | 30 days supply | Qty: 32 | Fill #1

## 2019-01-19 MED FILL — FOLIC ACID 1 MG TABS: 1 | 30 days supply | Qty: 30 | Fill #1

## 2019-01-20 DIAGNOSIS — E7801 Familial hypercholesterolemia: Secondary | ICD-10-CM | POA: Diagnosis not present

## 2019-01-21 LAB — LIPID PANEL
Chol/HDL Ratio: 3 ratio (ref 0.0–4.4)
Cholesterol, Total: 160 mg/dL (ref 100–199)
HDL: 54 mg/dL (ref >39)
LDL Chol Calc (NIH): 77 mg/dL (ref 0–99)
Triglycerides: 171 mg/dL — ABNORMAL HIGH (ref 0–149)
VLDL Cholesterol Cal: 29 mg/dL (ref 5–40)

## 2019-01-26 ENCOUNTER — Other Ambulatory Visit: Payer: Self-pay

## 2019-01-26 ENCOUNTER — Ambulatory Visit: Payer: BLUE CROSS/BLUE SHIELD | Admitting: Internal Medicine

## 2019-01-26 ENCOUNTER — Encounter: Payer: Self-pay | Admitting: Internal Medicine

## 2019-01-26 VITALS — BP 120/84 | HR 81 | Ht 63.5 in | Wt 116.2 lb

## 2019-01-26 DIAGNOSIS — E7801 Familial hypercholesterolemia: Secondary | ICD-10-CM

## 2019-01-26 DIAGNOSIS — Z789 Other specified health status: Secondary | ICD-10-CM | POA: Diagnosis not present

## 2019-01-26 DIAGNOSIS — R931 Abnormal findings on diagnostic imaging of heart and coronary circulation: Secondary | ICD-10-CM

## 2019-01-26 NOTE — Progress Notes (Signed)
LIPID CLINIC NOTE  Chief Complaint:  Follow-up dyslipidemia  Primary Care Physician: [name], MD  Primary Cardiologist:  No primary care provider on file.  HPI:  Marissa Guzman is a 53 y.o. female who is being seen today for the evaluation of dyslipidemia at the request of [name], MD. Jamese is a pleasant 53 year old female kindly referred by Dr. Charlett Blake for evaluation of dyslipidemia.  Medical history significant for type 2 diabetes, and, dyslipidemia and apparent carotid bruits in the past with normal Dopplers.  She is also been seen by Dr. Geraldo Pitter in May 2019.  History of heart disease in the family including both of her parents, but particularly more in her mother side including her mother, maternal grandmother, and sisters.  When queried more about this she mentioned that significant elevations in cholesterol are present in her sisters as well as her mother.  She has known about high cholesterol for some time.  Most recent labs a month ago showed a direct LDL of 186.  At that time her total cholesterol was 278, triglycerides 210 and a non-HDL cholesterol of 240.  In the past she is also struggled with high triglycerides, namely 4 months ago her triglycerides were close to 600.  Since then, she has been on numerous medications which were not tolerated and thought to cause myalgias, including atorvastatin, rosuvastatin, pitavastatin, fenofibrate, ezetimibe, Vascepa and niacin. Currently she is not on therapy. I have repeated her lipid study and her total cholesterol now is 404, TG 195, HDL 37 and LDL of 328 (direct 332). 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  She has no known coronary disease history.  01/26/2019  Hudsyn returns today for follow-up.  Overall she is doing very well.  She is tolerating Repatha and has had excellent results.  Her cholesterol has come down significantly.  Her last total cholesterol was 278.  Now she is down to  160 with triglycerides 171, HDL 54 and LDL 77.  This puts her at a near goal for aggressive therapy.  Her coronary calcium score over the summer was only 22 which is reassuring and gives [name] more reason to try to be aggressive with therapy.  She was sent for genetic testing because of high concern of a familial hyperlipidemia.  Blood work was sent out however we have not yet had a result of her genetic test.  I will reach out to Dr. Broadus John for that information.  She is interested in potentially screening family members if we are successful in identifying an abnormal gene.  PMHx:  Past Medical History:  Diagnosis Date  . Acute bronchitis 05/16/2012  . Allergic state 05/16/2012  . Allergy   . Constipation 04/16/2014  . Diabetes mellitus without complication ([place])   . DM2 (diabetes mellitus, type 2) ([place]) 08/10/2015  . GERD (gastroesophageal reflux disease) 06/12/2013  . Hip pain, bilateral 08/19/2015  . HTN (hypertension)   . Hyperglycemia 08/10/2015  . Hyperlipidemia   . Hypokalemia 11/14/2012  . Overweight 08/14/2016  . Seasonal allergies     Past Surgical History:  Procedure Laterality Date  . CESAREAN SECTION    . HEMORRHOID SURGERY     two    FAMHx:  Family History  Problem Relation Age of Onset  . Heart disease Father        Deceased  . Hypertension Father   . Lung cancer Father   . Melanoma Father        Eye  Removal  . Hyperlipidemia Mother   . Hypertension Mother   . Colon polyps Mother   . Hyperlipidemia Maternal Grandmother   . Diabetes Maternal Grandmother   . Dementia Maternal Grandmother   . Hypertension Sister        x2  . Colon polyps Sister   . Hyperlipidemia Sister        x2  . Colon polyps Sister   . Colon cancer Neg Hx   . Esophageal cancer Neg Hx   . Rectal cancer Neg Hx   . Stomach cancer Neg Hx     SOCHx:   reports that she has never smoked. She has never used smokeless tobacco. She reports that she does not drink alcohol or use drugs.  ALLERGIES:   Allergies  Allergen Reactions  . Fenofibrate Other (See Comments)    myalgias  . Doxycycline Monohydrate     GI Issues.  . Niacin And Related     Extreme flushing despite taking it correctly  . Statins Other (See Comments)    Livalo, crestor  . Tetracyclines & Related Other (See Comments)    GI Issues.    ROS: Pertinent items noted in HPI and remainder of comprehensive ROS otherwise negative.  HOME MEDS: Current Outpatient Medications on File Prior to Visit  Medication Sig Dispense Refill  . blood glucose meter kit and supplies KIT Dispense based on patient and insurance preference. Check BS  daily as directed.  DzE11.9 1 each 0  . cetirizine (ZYRTEC) 10 MG tablet Take 10 mg by mouth daily as needed.    . Evolocumab (REPATHA SURECLICK) 671 MG/ML SOAJ Inject 1 Dose into the skin every 14 (fourteen) days. 2 pen 11  . fluticasone (FLONASE) 50 MCG/ACT nasal spray Place 2 sprays into the nose daily as needed.    . folic acid (FOLVITE) 1 MG tablet     . Insulin Pen Needle 32G X 6 MM MISC Inject 1.20m into skin daily Dz E11.9 100 each 1  . liraglutide (VICTOZA) 18 MG/3ML SOPN Inject 1.2 mg into the skin daily.    .Marland KitchenMETHOTREXATE, ANTI-RHEUMATIC, PO     . metoprolol succinate (TOPROL-XL) 50 MG 24 hr tablet TAKE 1 & 1/2 TABLET BY MOUTH EVERY MORNING AND 1 TABLET IN THE EVENING WITH OR IMMEDIATELY FOLLOWING A MEAL 75 tablet 5  . omeprazole (PRILOSEC) 20 MG capsule Take 20 mg by mouth daily.    . ONE TOUCH ULTRA TEST test strip   0  . ONETOUCH DELICA LANCETS 324PMISC   0  . potassium chloride SA (K-DUR) 20 MEQ tablet TAKE 1 TABLET (20 MEQ TOTAL) BY MOUTH DAILY. 30 tablet 2  . predniSONE 5 MG TBEC     . triamterene-hydrochlorothiazide (MAXZIDE-25) 37.5-25 MG tablet TAKE 1 TABLET BY MOUTH DAILY. 90 tablet 1   No current facility-administered medications on file prior to visit.     LABS/IMAGING: No results found for this or any previous visit (from the past 48 hour(s)). No results found.   LIPID PANEL:    Component Value Date/Time   CHOL 160 01/20/2019 1531   CHOL 185 11/29/2013 0911   TRIG 171 (H) 01/20/2019 1531   TRIG 160 (H) 11/29/2013 0911   HDL 54 01/20/2019 1531   HDL 37 (L) 11/29/2013 0911   CHOLHDL 3.0 01/20/2019 1531   CHOLHDL 3 12/10/2018 0922   VLDL 41.0 (H) 12/10/2018 0922   LDLCALC 77 01/20/2019 1531   LDLCALC 116 (H) 11/29/2013 0911   LDLDIRECT 88.0  12/10/2018 0922    WEIGHTS: Wt Readings from Last 3 Encounters:  01/26/19 116 lb 3.2 oz (52.7 kg)  09/07/18 123 lb (55.8 kg)  07/30/18 128 lb (58.1 kg)    VITALS: BP 120/84   Pulse 81   Ht 5' 3.5" (1.613 m)   Wt 116 lb 3.2 oz (52.7 kg)   SpO2 99%   BMI 20.26 kg/m   EXAM: Deferred  EKG: Deferred  ASSESSMENT: 1. 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 Family history of premature CAD 3. Statin intolerance 4. Elevated CAC score of 22 (09/2018)  PLAN: 1.  Ms. Furnish has probable familial hyperlipidemia based on her family history and was referred for genetic testing.  That result is pending.  She has been intolerant to statins but has done very well with Repatha.  Her LDL now is 77 and very near goal.  We will continue her current therapies.  I will reach out to Dr. Broadus John for details about her genetic testing which was in September.  Plan follow-up with me and repeat lipids in 6 months.  Pixie Casino, MD, [facility], [place] Director of the [facility] Lipid Disorders &  Cardiovascular Risk Reduction Clinic Diplomate of the American Board of Clinical Lipidology Attending Cardiologist  Direct Dial: [phone]  Fax: [phone]  Website:  [email or web address]  01/26/2019, 11:54 AM

## 2019-01-26 NOTE — Patient Instructions (Signed)
Medication Instructions:  Your physician recommends that you continue on your current medications as directed. Please refer to the Current Medication list given to you today.  *If you need a refill on your cardiac medications before your next appointment, please call your pharmacy*  Lab Work: FASTING lab work in 6 months - before next lipid clinic appointment If you have labs (blood work) drawn today and your tests are completely normal, you will receive your results only by: Marland Kitchen MyChart Message (if you have MyChart) OR . A paper copy in the mail If you have any lab test that is abnormal or we need to change your treatment, we will call you to review the results.  Testing/Procedures: NONE  Follow-Up: Dr. Debara Pickett recommends that you schedule a follow up visit with him the in the [place] in 6 months. Please have fasting blood work about 1 week prior to this visit and he will review the blood work results with you at your appointment.

## 2019-01-27 ENCOUNTER — Other Ambulatory Visit: Payer: Self-pay | Admitting: Family Medicine

## 2019-01-27 MED FILL — VICTOZA 2-PAK 18 MG/3 ML PE: 18 | 60 days supply | Qty: 12 | Fill #0

## 2019-02-14 DIAGNOSIS — M79671 Pain in right foot: Secondary | ICD-10-CM | POA: Diagnosis not present

## 2019-02-14 DIAGNOSIS — Z79899 Other long term (current) drug therapy: Secondary | ICD-10-CM | POA: Diagnosis not present

## 2019-02-14 DIAGNOSIS — M069 Rheumatoid arthritis, unspecified: Secondary | ICD-10-CM | POA: Diagnosis not present

## 2019-02-14 DIAGNOSIS — M25532 Pain in left wrist: Secondary | ICD-10-CM | POA: Diagnosis not present

## 2019-02-14 DIAGNOSIS — M79643 Pain in unspecified hand: Secondary | ICD-10-CM | POA: Diagnosis not present

## 2019-02-15 ENCOUNTER — Ambulatory Visit (INDEPENDENT_AMBULATORY_CARE_PROVIDER_SITE_OTHER): Payer: BLUE CROSS/BLUE SHIELD | Admitting: *Deleted

## 2019-02-15 ENCOUNTER — Other Ambulatory Visit: Payer: Self-pay

## 2019-02-15 DIAGNOSIS — Z23 Encounter for immunization: Secondary | ICD-10-CM

## 2019-02-15 MED FILL — METHOTREXATE SODIUM 2.5 MG: 2.5 | 30 days supply | Qty: 32 | Fill #2

## 2019-02-15 MED FILL — METOPROLOL SUCCINATE ER 50: 50 | 30 days supply | Qty: 75 | Fill #4

## 2019-02-15 MED FILL — FOLIC ACID 1 MG TABS: 1 | 30 days supply | Qty: 30 | Fill #2

## 2019-02-15 MED FILL — REPATHA SURECLICK 140 MG/ML: 140 | 28 days supply | Qty: 2 | Fill #5

## 2019-02-15 NOTE — Progress Notes (Signed)
Patient here for second shingles vaccine.  Vaccine given in left deltoid and patient tolerated well.  

## 2019-02-18 DIAGNOSIS — M79643 Pain in unspecified hand: Secondary | ICD-10-CM | POA: Diagnosis not present

## 2019-02-18 DIAGNOSIS — M79671 Pain in right foot: Secondary | ICD-10-CM | POA: Diagnosis not present

## 2019-02-18 DIAGNOSIS — M069 Rheumatoid arthritis, unspecified: Secondary | ICD-10-CM | POA: Diagnosis not present

## 2019-02-18 DIAGNOSIS — Z79899 Other long term (current) drug therapy: Secondary | ICD-10-CM | POA: Diagnosis not present

## 2019-02-18 MED FILL — AMITRIPTYLINE HCL 10 MG TAB: 10 | 30 days supply | Qty: 60 | Fill #0

## 2019-02-24 ENCOUNTER — Ambulatory Visit: Payer: BLUE CROSS/BLUE SHIELD | Admitting: Genetic Counselor

## 2019-02-24 ENCOUNTER — Other Ambulatory Visit: Payer: Self-pay

## 2019-03-07 NOTE — Progress Notes (Signed)
Post-test Genetic Consultation notes  Albertia Carvin is here today for her post-test genetic consult of FH genetic testing.  We initially reviewed her family pedigree. [name] tells me that her paternal grandfather died of throat cancer and not heart disease. I have updated her pedigree to indicate this. She also informs me that her LDL-Cs are now normalized to 77 as per her last lipid test on 01/26/2019 and that she has been on Repatha, a PCSK9 inhibitor, for the last 5 months.  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 I explained to her that the PCSK9 D129N variant has been reported in atleast three FH patients of different ethnicities, including Hispanic, European and Asian and was found to segregate with disease in a FH family. I informed her that the PCSK9 D129N variant seems to have conflicting evidence on its pathogenicity and it would be prudent to see if the variant segregated with disease in her family. 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   I reiterated that since this is an autosomal dominant condition, her son, age 53is at a 50% risk of inheriting the pathogenic variant. I recommended that he undergoes testing for the familial pathogenic variant. I again went through the protections afforded by [employer] and she acknowledged understanding of this.  We then discussed follow-up for genetics in her other family members. I recommend that her first-degree family members, namely her 2 sisters get tested for the familial variant and pursue appropriate treatment if tested positive for PCSK9 D129N variant.  Please note that the patient has not been counseled in this visit on personal, cultural or ethical issues that she may face due to her heart condition    Lattie Corns, Ph.D, [facility] Clinical Molecular  Geneticist

## 2019-03-15 MED FILL — METHOTREXATE SODIUM 2.5 MG: 2.5 | 28 days supply | Qty: 32 | Fill #0

## 2019-03-15 MED FILL — REPATHA SURECLICK 140 MG/ML: 140 | 28 days supply | Qty: 2 | Fill #6

## 2019-03-16 MED FILL — METOPROLOL SUCCINATE ER 50: 50 | 30 days supply | Qty: 75 | Fill #5

## 2019-03-21 MED FILL — FOLIC ACID 1 MG TABS: 1 | 30 days supply | Qty: 30 | Fill #0

## 2019-03-28 DIAGNOSIS — M069 Rheumatoid arthritis, unspecified: Secondary | ICD-10-CM | POA: Diagnosis not present

## 2019-03-28 DIAGNOSIS — Z79899 Other long term (current) drug therapy: Secondary | ICD-10-CM | POA: Diagnosis not present

## 2019-03-28 DIAGNOSIS — M1990 Unspecified osteoarthritis, unspecified site: Secondary | ICD-10-CM | POA: Diagnosis not present

## 2019-03-28 DIAGNOSIS — M797 Fibromyalgia: Secondary | ICD-10-CM | POA: Diagnosis not present

## 2019-03-28 MED FILL — NAPROXEN 500 MG TABLET: 500 | 30 days supply | Qty: 60 | Fill #0

## 2019-03-29 MED FILL — TECHLITE PEN NDL 32GX1/4: 32G X 6 MM | 90 days supply | Qty: 100 | Fill #0

## 2019-03-29 MED FILL — TECHLITE PEN NDL 32GX1/4": 32G X 6 MM | 90 days supply | Qty: 100 | Fill #0

## 2019-04-07 ENCOUNTER — Other Ambulatory Visit: Payer: BLUE CROSS/BLUE SHIELD

## 2019-04-07 ENCOUNTER — Ambulatory Visit: Payer: BC Managed Care – PPO | Attending: Internal Medicine

## 2019-04-07 DIAGNOSIS — Z20822 Contact with and (suspected) exposure to covid-19: Secondary | ICD-10-CM

## 2019-04-08 LAB — NOVEL CORONAVIRUS, NAA: SARS-CoV-2, NAA: DETECTED — AB

## 2019-04-11 ENCOUNTER — Other Ambulatory Visit: Payer: Self-pay | Admitting: Adult Health

## 2019-04-11 ENCOUNTER — Telehealth: Payer: Self-pay

## 2019-04-11 DIAGNOSIS — U071 COVID-19: Secondary | ICD-10-CM

## 2019-04-11 MED FILL — VICTOZA 2-PAK 18 MG/3 ML PE: 18 | 60 days supply | Qty: 12 | Fill #1

## 2019-04-11 MED FILL — REPATHA SURECLICK 140 MG/ML: 140 | 28 days supply | Qty: 2 | Fill #7

## 2019-04-11 NOTE — Telephone Encounter (Signed)
PA initiated via Covermymeds; KEY: [id number]. PA approved.   Effective from 04/11/2019 through 04/09/2020.

## 2019-04-11 NOTE — Progress Notes (Signed)
  I connected by phone with [name] on 04/11/2019 at 9:53 AM to discuss the potential use of an new treatment for mild to moderate COVID-19 viral infection in non-hospitalized patients.  This patient is a 54 y.o. female that meets the FDA criteria for Emergency Use Authorization of bamlanivimab or casirivimab\imdevimab.  Has a (+) direct SARS-CoV-2 viral test result  Has mild or moderate COVID-19   Is ? 54 years of age and weighs ? 40 kg  Is NOT hospitalized due to COVID-19  Is NOT requiring oxygen therapy or requiring an increase in baseline oxygen flow rate due to COVID-19  Is within 10 days of symptom onset  Has at least one of the high risk factor(s) for progression to severe COVID-19 and/or hospitalization as defined in EUA.  Specific high risk criteria : Diabetes   I have spoken and communicated the following to the patient or parent/caregiver: Discussion with patient regarding MAB after COVID 19 vaccine (received on 04/02/19) is given on individual basis with benefit /risk unknown at this time. Agrees to proceed as her high risk category for COVID 19 complications are high with underlying Diabetes and immunosuppression , the potential benefits of MAB outweigh the risk.   1. FDA has authorized the emergency use of bamlanivimab and casirivimab\imdevimab for the treatment of mild to moderate COVID-19 in adults and pediatric patients with positive results of direct SARS-CoV-2 viral testing who are 31 years of age and older weighing at least 40 kg, and who are at high risk for progressing to severe COVID-19 and/or hospitalization.  2. The significant known and potential risks and benefits of bamlanivimab and casirivimab\imdevimab, and the extent to which such potential risks and benefits are unknown.  3. Information on available alternative treatments and the risks and benefits of those alternatives, including clinical trials.  4. Patients treated with bamlanivimab and  casirivimab\imdevimab should continue to self-isolate and use infection control measures (e.g., wear mask, isolate, social distance, avoid sharing personal items, clean and disinfect "high touch" surfaces, and frequent handwashing) according to CDC guidelines.   5. The patient or parent/caregiver has the option to accept or refuse bamlanivimab or casirivimab\imdevimab .  After reviewing this information with the patient, The patient agreed to proceed with receiving the bamlanimivab infusion and will be provided a copy of the Fact sheet prior to receiving the infusion..  Scheduled for 04/14/19 at 1230.   Marissa Guzman 04/11/2019 9:53 AM

## 2019-04-12 MED FILL — TRIAMTERENE-HCTZ 37.5-25 MG: 37.5-25 | 90 days supply | Qty: 90 | Fill #1

## 2019-04-14 ENCOUNTER — Ambulatory Visit (HOSPITAL_COMMUNITY)
Admission: RE | Admit: 2019-04-14 | Discharge: 2019-04-14 | Disposition: A | Discharge status: Home / Self Care | Payer: BC Managed Care – PPO | Source: Ambulatory Visit | Attending: Pulmonary Disease | Admitting: Pulmonary Disease

## 2019-04-14 DIAGNOSIS — U071 COVID-19: Secondary | ICD-10-CM

## 2019-04-14 DIAGNOSIS — Z23 Encounter for immunization: Secondary | ICD-10-CM | POA: Diagnosis not present

## 2019-04-14 MED ORDER — DIPHENHYDRAMINE HCL 50 MG/ML IJ SOLN: 50.0000 mg | Freq: Once | INTRAMUSCULAR | Status: DC | PRN

## 2019-04-14 MED ORDER — ALBUTEROL SULFATE HFA 108 (90 BASE) MCG/ACT IN AERS: 2.0000 | INHALATION_SPRAY | Freq: Once | RESPIRATORY_TRACT | Status: DC | PRN

## 2019-04-14 MED ORDER — METHYLPREDNISOLONE SODIUM SUCC 125 MG IJ SOLR: 125.0000 mg | Freq: Once | INTRAMUSCULAR | Status: DC | PRN

## 2019-04-14 MED ORDER — EPINEPHRINE 0.3 MG/0.3ML IJ SOAJ: 0.3000 mg | Freq: Once | INTRAMUSCULAR | Status: DC | PRN

## 2019-04-14 MED ORDER — SODIUM CHLORIDE 0.9 % IV SOLN
INTRAVENOUS | Status: DC | PRN
  Administered 2019-04-14: 250 mL via INTRAVENOUS

## 2019-04-14 MED ORDER — FAMOTIDINE IN NACL 20-0.9 MG/50ML-% IV SOLN: 20.0000 mg | Freq: Once | INTRAVENOUS | Status: DC | PRN

## 2019-04-14 MED ORDER — SODIUM CHLORIDE 0.9 % IV SOLN
700.0000 mg | Freq: Once | INTRAVENOUS | Status: AC
  Administered 2019-04-14: 700 mg via INTRAVENOUS
  Filled 2019-04-14: qty 20

## 2019-04-14 NOTE — Progress Notes (Signed)
  Diagnosis: COVID-19  Physician: Patrick Wright  Procedure: [facility] Med: bamlanivimab infusion - Provided patient with bamlanimivab fact sheet for patients, parents and caregivers prior to infusion.  Complications: No immediate complications noted.  Discharge: Discharged home   [name] 04/14/2019   

## 2019-04-14 NOTE — Discharge Instructions (Signed)
COVID-19 COVID-19 is a respiratory infection that is caused by a virus called severe acute respiratory syndrome coronavirus 2 (SARS-CoV-2). The disease is also known as coronavirus disease or novel coronavirus. In some people, the virus may not cause any symptoms. In others, it may cause a serious infection. The infection can get worse quickly and can lead to complications, such as:  Pneumonia, or infection of the lungs.  Acute respiratory distress syndrome or ARDS. This is a condition in which fluid build-up in the lungs prevents the lungs from filling with air and passing oxygen into the blood.  Acute respiratory failure. This is a condition in which there is not enough oxygen passing from the lungs to the body or when carbon dioxide is not passing from the lungs out of the body.  Sepsis or septic shock. This is a serious bodily reaction to an infection.  Blood clotting problems.  Secondary infections due to bacteria or fungus.  Organ failure. This is when your body's organs stop working. The virus that causes COVID-19 is contagious. This means that it can spread from person to person through droplets from coughs and sneezes (respiratory secretions). What are the causes? This illness is caused by a virus. You may catch the virus by:  Breathing in droplets from an infected person. Droplets can be spread by a person breathing, speaking, singing, coughing, or sneezing.  Touching something, like a table or a doorknob, that was exposed to the virus (contaminated) and then touching your mouth, nose, or eyes. What increases the risk? Risk for infection You are more likely to be infected with this virus if you:  Are within 6 feet (2 meters) of a person with COVID-19.  Provide care for or live with a person who is infected with COVID-19.  Spend time in crowded indoor spaces or live in shared housing. Risk for serious illness You are more likely to become seriously ill from the virus if  you:  Are 50 years of age or older. The higher your age, the more you are at risk for serious illness.  Live in a nursing home or long-term care facility.  Have cancer.  Have a long-term (chronic) disease such as: ? Chronic lung disease, including chronic obstructive pulmonary disease or asthma. ? A long-term disease that lowers your body's ability to fight infection (immunocompromised). ? Heart disease, including heart failure, a condition in which the arteries that lead to the heart become narrow or blocked (coronary artery disease), a disease which makes the heart muscle thick, weak, or stiff (cardiomyopathy). ? Diabetes. ? Chronic kidney disease. ? Sickle cell disease, a condition in which red blood cells have an abnormal "sickle" shape. ? Liver disease.  Are obese. What are the signs or symptoms? Symptoms of this condition can range from mild to severe. Symptoms may appear any time from 2 to 14 days after being exposed to the virus. They include:  A fever or chills.  A cough.  Difficulty breathing.  Headaches, body aches, or muscle aches.  Runny or stuffy (congested) nose.  A sore throat.  New loss of taste or smell. Some people may also have stomach problems, such as nausea, vomiting, or diarrhea. Other people may not have any symptoms of COVID-19. How is this diagnosed? This condition may be diagnosed based on:  Your signs and symptoms, especially if: ? You live in an area with a COVID-19 outbreak. ? You recently traveled to or from an area where the virus is common. ? You   provide care for or live with a person who was diagnosed with COVID-19. ? You were exposed to a person who was diagnosed with COVID-19.  A physical exam.  Lab tests, which may include: ? Taking a sample of fluid from the back of your nose and throat (nasopharyngeal fluid), your nose, or your throat using a swab. ? A sample of mucus from your lungs (sputum). ? Blood tests.  Imaging tests,  which may include, X-rays, CT scan, or ultrasound. How is this treated? At present, there is no medicine to treat COVID-19. Medicines that treat other diseases are being used on a trial basis to see if they are effective against COVID-19. Your health care provider will talk with you about ways to treat your symptoms. For most people, the infection is mild and can be managed at home with rest, fluids, and over-the-counter medicines. Treatment for a serious infection usually takes places in a hospital intensive care unit (ICU). It may include one or more of the following treatments. These treatments are given until your symptoms improve.  Receiving fluids and medicines through an IV.  Supplemental oxygen. Extra oxygen is given through a tube in the nose, a face mask, or a hood.  Positioning you to lie on your stomach (prone position). This makes it easier for oxygen to get into the lungs.  Continuous positive airway pressure (CPAP) or bi-level positive airway pressure (BPAP) machine. This treatment uses mild air pressure to keep the airways open. A tube that is connected to a motor delivers oxygen to the body.  Ventilator. This treatment moves air into and out of the lungs by using a tube that is placed in your windpipe.  Tracheostomy. This is a procedure to create a hole in the neck so that a breathing tube can be inserted.  Extracorporeal membrane oxygenation (ECMO). This procedure gives the lungs a chance to recover by taking over the functions of the heart and lungs. It supplies oxygen to the body and removes carbon dioxide. Follow these instructions at home: Lifestyle  If you are sick, stay home except to get medical care. Your health care provider will tell you how long to stay home. Call your health care provider before you go for medical care.  Rest at home as told by your health care provider.  Do not use any products that contain nicotine or tobacco, such as cigarettes,  e-cigarettes, and chewing tobacco. If you need help quitting, ask your health care provider.  Return to your normal activities as told by your health care provider. Ask your health care provider what activities are safe for you. General instructions  Take over-the-counter and prescription medicines only as told by your health care provider.  Drink enough fluid to keep your urine pale yellow.  Keep all follow-up visits as told by your health care provider. This is important. How is this prevented?  There is no vaccine to help prevent COVID-19 infection. However, there are steps you can take to protect yourself and others from this virus. To protect yourself:   Do not travel to areas where COVID-19 is a risk. The areas where COVID-19 is reported change often. To identify high-risk areas and travel restrictions, check the CDC travel website: wwwnc.cdc.gov/travel/notices  If you live in, or must travel to, an area where COVID-19 is a risk, take precautions to avoid infection. ? Stay away from people who are sick. ? Wash your hands often with soap and water for 20 seconds. If soap and water   are not available, use an alcohol-based hand sanitizer. ? Avoid touching your mouth, face, eyes, or nose. ? Avoid going out in public, follow guidance from your state and local health authorities. ? If you must go out in public, wear a cloth face covering or face mask. Make sure your mask covers your nose and mouth. ? Avoid crowded indoor spaces. Stay at least 6 feet (2 meters) away from others. ? Disinfect objects and surfaces that are frequently touched every day. This may include:  Counters and tables.  Doorknobs and light switches.  Sinks and faucets.  Electronics, such as phones, remote controls, keyboards, computers, and tablets. To protect others: If you have symptoms of COVID-19, take steps to prevent the virus from spreading to others.  If you think you have a COVID-19 infection, contact  your health care provider right away. Tell your health care team that you think you may have a COVID-19 infection.  Stay home. Leave your house only to seek medical care. Do not use public transport.  Do not travel while you are sick.  Wash your hands often with soap and water for 20 seconds. If soap and water are not available, use alcohol-based hand sanitizer.  Stay away from other members of your household. Let healthy household members care for children and pets, if possible. If you have to care for children or pets, wash your hands often and wear a mask. If possible, stay in your own room, separate from others. Use a different bathroom.  Make sure that all people in your household wash their hands well and often.  Cough or sneeze into a tissue or your sleeve or elbow. Do not cough or sneeze into your hand or into the air.  Wear a cloth face covering or face mask. Make sure your mask covers your nose and mouth. Where to find more information  Centers for Disease Control and Prevention: www.cdc.gov/coronavirus/2019-ncov/index.html  World Health Organization: www.who.int/health-topics/coronavirus Contact a health care provider if:  You live in or have traveled to an area where COVID-19 is a risk and you have symptoms of the infection.  You have had contact with someone who has COVID-19 and you have symptoms of the infection. Get help right away if:  You have trouble breathing.  You have pain or pressure in your chest.  You have confusion.  You have bluish lips and fingernails.  You have difficulty waking from sleep.  You have symptoms that get worse. These symptoms may represent a serious problem that is an emergency. Do not wait to see if the symptoms will go away. Get medical help right away. Call your local emergency services (911 in the [place] Do not drive yourself to the hospital. Let the emergency medical personnel know if you think you have  COVID-19. Summary  COVID-19 is a respiratory infection that is caused by a virus. It is also known as coronavirus disease or novel coronavirus. It can cause serious infections, such as pneumonia, acute respiratory distress syndrome, acute respiratory failure, or sepsis.  The virus that causes COVID-19 is contagious. This means that it can spread from person to person through droplets from breathing, speaking, singing, coughing, or sneezing.  You are more likely to develop a serious illness if you are 50 years of age or older, have a weak immune system, live in a nursing home, or have chronic disease.  There is no medicine to treat COVID-19. Your health care provider will talk with you about ways to treat your symptoms.    Take steps to protect yourself and others from infection. Wash your hands often and disinfect objects and surfaces that are frequently touched every day. Stay away from people who are sick and wear a mask if you are sick. This information is not intended to replace advice given to you by your health care provider. Make sure you discuss any questions you have with your health care provider. Document Revised: 12/31/2018 Document Reviewed: 04/08/2018 Elsevier Patient Education  2020 [employer]. What types of side effects do monoclonal antibody drugs cause?  Common side effects  In general, the more common side effects caused by monoclonal antibody drugs include: . Allergic reactions, such as hives or itching . Flu-like signs and symptoms, including chills, fatigue, fever, and muscle aches and pains . Nausea, vomiting . Diarrhea . Skin rashes . Low blood pressure   The CDC is recommending patients who receive monoclonal antibody treatments wait at least 90 days before being vaccinated.  Currently, there are no data on the safety and efficacy of mRNA COVID-19 vaccines in persons who received monoclonal antibodies or convalescent plasma as part of COVID-19 treatment. Based  on the estimated half-life of such therapies as well as evidence suggesting that reinfection is uncommon in the 90 days after initial infection, vaccination should be deferred for at least 90 days, as a precautionary measure until additional information becomes available, to avoid interference of the antibody treatment with vaccine-induced immune responses. 

## 2019-04-18 ENCOUNTER — Other Ambulatory Visit: Payer: Self-pay | Admitting: Family Medicine

## 2019-04-18 DIAGNOSIS — I10 Essential (primary) hypertension: Secondary | ICD-10-CM

## 2019-04-18 MED FILL — METOPROLOL SUCCINATE ER 50: 50 | 30 days supply | Qty: 75 | Fill #0

## 2019-05-04 ENCOUNTER — Encounter: Payer: Self-pay | Admitting: *Deleted

## 2019-05-04 ENCOUNTER — Other Ambulatory Visit: Payer: Self-pay | Admitting: *Deleted

## 2019-05-06 ENCOUNTER — Ambulatory Visit: Payer: BC Managed Care – PPO | Admitting: Diagnostic Neuroimaging

## 2019-05-10 MED FILL — REPATHA SURECLICK 140 MG/ML: 140 | 28 days supply | Qty: 2 | Fill #8

## 2019-05-16 DIAGNOSIS — M1990 Unspecified osteoarthritis, unspecified site: Secondary | ICD-10-CM | POA: Diagnosis not present

## 2019-05-16 DIAGNOSIS — M797 Fibromyalgia: Secondary | ICD-10-CM | POA: Diagnosis not present

## 2019-05-16 DIAGNOSIS — M069 Rheumatoid arthritis, unspecified: Secondary | ICD-10-CM | POA: Diagnosis not present

## 2019-05-16 DIAGNOSIS — Z79899 Other long term (current) drug therapy: Secondary | ICD-10-CM | POA: Diagnosis not present

## 2019-05-16 MED FILL — METHOTREXATE SODIUM 2.5 MG: 2.5 | 28 days supply | Qty: 32 | Fill #0

## 2019-05-16 MED FILL — FOLIC ACID 1 MG TABS: 1 | 30 days supply | Qty: 30 | Fill #0

## 2019-05-17 ENCOUNTER — Other Ambulatory Visit: Payer: Self-pay

## 2019-05-17 ENCOUNTER — Ambulatory Visit: Payer: BC Managed Care – PPO | Admitting: Diagnostic Neuroimaging

## 2019-05-17 ENCOUNTER — Encounter: Payer: Self-pay | Admitting: Diagnostic Neuroimaging

## 2019-05-17 VITALS — BP 115/77 | HR 78 | Temp 97.6°F | Ht 63.5 in | Wt 121.2 lb

## 2019-05-17 DIAGNOSIS — M791 Myalgia, unspecified site: Secondary | ICD-10-CM

## 2019-05-17 DIAGNOSIS — R200 Anesthesia of skin: Secondary | ICD-10-CM

## 2019-05-17 DIAGNOSIS — N958 Other specified menopausal and perimenopausal disorders: Secondary | ICD-10-CM | POA: Diagnosis not present

## 2019-05-17 DIAGNOSIS — Z1231 Encounter for screening mammogram for malignant neoplasm of breast: Secondary | ICD-10-CM | POA: Diagnosis not present

## 2019-05-17 DIAGNOSIS — Z1151 Encounter for screening for human papillomavirus (HPV): Secondary | ICD-10-CM | POA: Diagnosis not present

## 2019-05-17 DIAGNOSIS — R202 Paresthesia of skin: Secondary | ICD-10-CM | POA: Diagnosis not present

## 2019-05-17 DIAGNOSIS — Z01419 Encounter for gynecological examination (general) (routine) without abnormal findings: Secondary | ICD-10-CM | POA: Diagnosis not present

## 2019-05-17 LAB — RESULTS CONSOLE HPV: CHL HPV: NEGATIVE

## 2019-05-17 LAB — HM PAP SMEAR: HM Pap smear: NORMAL

## 2019-05-17 MED FILL — ESTRADIOL 0.1 MG/GM CRM: 0.1 | 90 days supply | Qty: 43 | Fill #0

## 2019-05-17 NOTE — Patient Instructions (Signed)
NUMBNESS / PAIN (diabetic neuropathy, rheumatoid arthritis, osteoarthritis) - check labs

## 2019-05-17 NOTE — Progress Notes (Signed)
[facility]  PATIENT: Marissa Guzman DOB: [birth year]  REFERRING CLINICIAN: [name], MD HISTORY FROM: patient  REASON FOR VISIT: new consult    HISTORICAL  CHIEF COMPLAINT:  Chief Complaint  Patient presents with  . Numbness/tingling    rm 7 New Pt, husband- [place] "numbness/tingling/burning on R foot , heel is numb, pins sticking in upper part of back; muscle in my limbs hurt; symptoms x 1 year"    HISTORY OF PRESENT ILLNESS:   54 year old female here for evaluation of numbness and tingling.  Patient was diagnosed diabetes in 2019 and since that time has had problems with numbness, tingling, joint pain, muscle pain.  Symptoms seem to be worse when she is exerting herself or trying to lift things.  Patient went to rheumatologist and was diagnosed with possible rheumatoid arthritis, started on methotrexate.  Possibility of fibromyalgia has also been raised.  Also has osteoarthritis.  Patient has had B12 deficiency in the past was on replacement for some time but not currently.    REVIEW OF SYSTEMS: Full 14 system review of systems performed and negative with exception of: As per HPI.  Some insomnia.  ALLERGIES: Allergies  Allergen Reactions  . Fenofibrate Other (See Comments)    myalgias  . Doxycycline Monohydrate     GI Issues.  . Niacin And Related     Extreme flushing despite taking it correctly  . Statins Other (See Comments)    Livalo, crestor myalgias  . Tetracyclines & Related Other (See Comments)    GI Issues.    HOME MEDICATIONS: Outpatient Medications Prior to Visit  Medication Sig Dispense Refill  . amitriptyline (ELAVIL) 10 MG tablet 20 mg at bedtime.    . blood glucose meter kit and supplies KIT Dispense based on patient and insurance preference. Check BS  daily as directed.  DzE11.9 1 each 0  . cetirizine (ZYRTEC) 10 MG tablet Take 10 mg by mouth daily as needed.    . Evolocumab (REPATHA SURECLICK) 676 MG/ML SOAJ Inject 1  Dose into the skin every 14 (fourteen) days. 2 pen 11  . fluticasone (FLONASE) 50 MCG/ACT nasal spray Place 2 sprays into the nose daily as needed.    . folic acid (FOLVITE) 1 MG tablet     . Insulin Pen Needle 32G X 6 MM MISC Inject 1.37m into skin daily Dz E11.9 100 each 1  . methotrexate 2.5 MG tablet 2.5 mg every 7 (seven) days.    . metoprolol succinate (TOPROL-XL) 50 MG 24 hr tablet TAKE 1 & 1/2 TABLET BY MOUTH EVERY MORNING AND 1 TABLET IN THE EVENING WITH OR IMMEDIATELY FOLLOWING A MEAL 75 tablet 5  . naproxen (NAPROSYN) 500 MG tablet Take 500 mg by mouth 2 (two) times daily.    .Marland Kitchenomeprazole (PRILOSEC) 20 MG capsule Take 20 mg by mouth daily.    . ONE TOUCH ULTRA TEST test strip   0  . ONETOUCH DELICA LANCETS 319JMISC   0  . triamterene-hydrochlorothiazide (MAXZIDE-25) 37.5-25 MG tablet TAKE 1 TABLET BY MOUTH DAILY. 90 tablet 1  . VICTOZA 18 MG/3ML SOPN INJECT 0.2 MLS (1.2 MG TOTAL) INTO THE SKIN DAILY. 12 mL 3  . METHOTREXATE, ANTI-RHEUMATIC, PO     . potassium chloride SA (K-DUR) 20 MEQ tablet TAKE 1 TABLET (20 MEQ TOTAL) BY MOUTH DAILY. 30 tablet 2  . predniSONE 5 MG TBEC      No facility-administered medications prior to visit.    PAST MEDICAL HISTORY:  Past Medical History:  Diagnosis Date  . Acute bronchitis 05/16/2012  . Allergic state 05/16/2012  . Allergy   . Constipation 04/16/2014  . Diabetes mellitus without complication ([place])   . DM2 (diabetes mellitus, type 2) ([place]) 08/10/2015  . Fibromyalgia   . GERD (gastroesophageal reflux disease) 06/12/2013  . Hip pain, bilateral 08/19/2015  . HTN (hypertension)   . Hyperglycemia 08/10/2015  . Hyperlipidemia   . Hypokalemia 11/14/2012  . Osteoarthritis   . Overweight 08/14/2016  . Rheumatoid arthritis ([place])   . Seasonal allergies     PAST SURGICAL HISTORY: Past Surgical History:  Procedure Laterality Date  . CESAREAN SECTION    . HEMORRHOID SURGERY     two    FAMILY HISTORY: Family History  Problem Relation Age of  Onset  . Heart disease Father        Deceased  . Hypertension Father   . Lung cancer Father   . Melanoma Father        Eye Removal  . Hyperlipidemia Mother   . Hypertension Mother   . Colon polyps Mother   . Arthritis/Rheumatoid Mother   . Hyperlipidemia Maternal Grandmother   . Diabetes Maternal Grandmother   . Dementia Maternal Grandmother   . Hypertension Sister        x2  . Colon polyps Sister   . Lupus Sister   . Hyperlipidemia Sister        x2  . Colon polyps Sister   . Colon cancer Neg Hx   . Esophageal cancer Neg Hx   . Rectal cancer Neg Hx   . Stomach cancer Neg Hx     SOCIAL HISTORY: Social History   Socioeconomic History  . Marital status: Married    Spouse name: [place]  . Number of children: 1  . Years of education: 59  . 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 Smoking status: Never Smoker  . Smokeless tobacco: Never Used  Substance and Sexual Activity  . Alcohol use: No  . Drug use: No  . 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 Not on file  Social History Narrative   Lives with husband   Caffeine- coffee 1 c   Social Determinants of Health   Financial Resource Strain:   . Difficulty of Paying Living Expenses: Not on file  Food Insecurity:   . Worried About Charity fundraiser in the Last Year: Not on file  . Ran Out of Food in the Last Year: Not on file  Transportation Needs:   . Lack of Transportation (Medical): Not on file  . Lack of Transportation (Non-Medical): Not on file  Physical Activity:   . Days of Exercise per Week: Not on file  . Minutes of Exercise per Session: Not on file  Stress:   . Feeling of Stress : Not on file  Social Connections:   . Frequency of Communication with Friends and Family: Not on file  . Frequency of Social Gatherings  with Friends and Family: Not on file  . Attends Religious Services: Not on file  . Active Member of Clubs or Organizations: Not on file  . Attends Archivist Meetings: Not on file  . Marital Status: Not on file  Intimate Partner Violence:   . Fear of Current or  Ex-Partner: Not on file  . Emotionally Abused: Not on file  . Physically Abused: Not on file  . Sexually Abused: Not on file     PHYSICAL EXAM  GENERAL EXAM/CONSTITUTIONAL: Vitals:  Vitals:   05/17/19 1405  BP: 115/77  Pulse: 78  Temp: 97.6 F (36.4 C)  Weight: 121 lb 3.2 oz (55 kg)  Height: 5' 3.5" (1.613 m)     Body mass index is 21.13 kg/m. Wt Readings from Last 3 Encounters:  05/17/19 121 lb 3.2 oz (55 kg)  01/26/19 116 lb 3.2 oz (52.7 kg)  09/07/18 123 lb (55.8 kg)     Patient is in no distress; well developed, nourished and groomed; neck is supple  CARDIOVASCULAR:  Examination of carotid arteries is normal; no carotid bruits  Regular rate and rhythm, no murmurs  Examination of peripheral vascular system by observation and palpation is normal  EYES:  Ophthalmoscopic exam of optic discs and posterior segments is normal; no papilledema or hemorrhages  No exam data present  MUSCULOSKELETAL:  Gait, strength, tone, movements noted in Neurologic exam below  NEUROLOGIC: MENTAL STATUS:  No flowsheet data found.  awake, alert, oriented to person, place and time  recent and remote memory intact  normal attention and concentration  language fluent, comprehension intact, naming intact  fund of knowledge appropriate  CRANIAL NERVE:   2nd - no papilledema on fundoscopic exam  2nd, 3rd, 4th, 6th - pupils equal and reactive to light, visual fields full to confrontation, extraocular muscles intact, no nystagmus  5th - facial sensation symmetric  7th - facial strength symmetric  8th - hearing intact  9th - palate elevates symmetrically, uvula midline  11th - shoulder shrug  symmetric  12th - tongue protrusion midline  MOTOR:   normal bulk and tone, full strength in the BUE, BLE  SENSORY:   normal and symmetric to light touch, temperature, vibration  COORDINATION:   finger-nose-finger, fine finger movements normal  REFLEXES:   deep tendon reflexes 2+ and symmetric  GAIT/STATION:   narrow based gait     DIAGNOSTIC DATA (LABS, IMAGING, TESTING) - I reviewed patient records, labs, notes, testing and imaging myself where available.  Lab Results  Component Value Date   WBC 8.1 04/30/2018   HGB 12.2 04/30/2018   HCT 36.1 04/30/2018   MCV 90.5 04/30/2018   PLT 292.0 04/30/2018      Component Value Date/Time   NA 139 12/10/2018 0922   NA 137 03/13/2017 0816   K 4.0 12/10/2018 0922   CL 99 12/10/2018 0922   CO2 31 12/10/2018 0922   GLUCOSE 101 (H) 12/10/2018 0922   BUN 20 12/10/2018 0922   BUN 20 03/13/2017 0816   CREATININE 0.98 12/10/2018 0922   CREATININE 0.76 06/01/2013 1105   CALCIUM 10.4 12/10/2018 0922   PROT 6.7 12/10/2018 0922   PROT 7.0 06/03/2017 0810   ALBUMIN 4.4 12/10/2018 0922   ALBUMIN 4.1 06/03/2017 0810   AST 21 12/10/2018 0922   ALT 15 12/10/2018 0922   ALKPHOS 75 12/10/2018 0922   BILITOT 1.1 12/10/2018 0922   BILITOT 0.7 06/03/2017 0810   GFRNONAA 67 03/13/2017 0816   GFRAA 77 03/13/2017 0816   Lab Results  Component Value Date   CHOL 160 01/20/2019   HDL 54 01/20/2019   LDLCALC 77 01/20/2019   LDLDIRECT 88.0 12/10/2018   TRIG 171 (H) 01/20/2019   CHOLHDL 3.0 01/20/2019   Lab Results  Component Value Date   HGBA1C 6.3 12/10/2018  Lab Results  Component Value Date   VITAMINB12 283 08/19/2018   Lab Results  Component Value Date   TSH 2.24 04/30/2018       ASSESSMENT AND PLAN  54 y.o. year old female here with new onset numbness, tingling, pain since 2019, in the setting of diabetes and B12 deficiency.  Also diagnosed with rheumatoid arthritis and osteoarthritis.  Dx:  1. Numbness and  tingling   2. Muscle pain     PLAN:  NUMBNESS / PAIN (diabetic neuropathy, rheumatoid arthritis, osteoarthritis) - check neuropathy / myopathy labs  Orders Placed This Encounter  Procedures  . Vitamin B12  . Hemoglobin A1c  . Multiple Myeloma Panel (SPEP&IFE w/QIG)  . CK  . Aldolase   Return for pending if symptoms worsen or fail to improve.    Penni Bombard, MD 06/17/2001, 7:94 PM Certified in Neurology, Neurophysiology and Neuroimaging  [facility] [address] [place], [place] [address] [phone]

## 2019-05-23 LAB — MULTIPLE MYELOMA PANEL, SERUM
Albumin SerPl Elph-Mcnc: 4 g/dL (ref 2.9–4.4)
Albumin/Glob SerPl: 1.6 (ref 0.7–1.7)
Alpha 1: 0.2 g/dL (ref 0.0–0.4)
Alpha2 Glob SerPl Elph-Mcnc: 0.7 g/dL (ref 0.4–1.0)
B-Globulin SerPl Elph-Mcnc: 0.8 g/dL (ref 0.7–1.3)
Gamma Glob SerPl Elph-Mcnc: 0.9 g/dL (ref 0.4–1.8)
Globulin, Total: 2.6 g/dL (ref 2.2–3.9)
IgA/Immunoglobulin A, Serum: 201 mg/dL (ref 87–352)
IgG (Immunoglobin G), Serum: 1046 mg/dL (ref 586–1602)
IgM (Immunoglobulin M), Srm: 68 mg/dL (ref 26–217)
Total Protein: 6.6 g/dL (ref 6.0–8.5)

## 2019-05-23 LAB — HEMOGLOBIN A1C
Est. average glucose Bld gHb Est-mCnc: 105 mg/dL
Hgb A1c MFr Bld: 5.3 % (ref 4.8–5.6)

## 2019-05-23 LAB — VITAMIN B12: Vitamin B-12: 409 pg/mL (ref 232–1245)

## 2019-05-23 LAB — CK: Total CK: 60 U/L (ref 32–182)

## 2019-05-23 LAB — ALDOLASE: Aldolase: 4 U/L (ref 3.3–10.3)

## 2019-05-23 MED FILL — METOPROLOL SUCCINATE ER 50: 50 | 30 days supply | Qty: 75 | Fill #1

## 2019-05-23 MED FILL — NAPROXEN 500 MG TABLET: 500 | 30 days supply | Qty: 60 | Fill #1

## 2019-05-30 ENCOUNTER — Encounter: Payer: Self-pay | Admitting: *Deleted

## 2019-05-30 DIAGNOSIS — H2513 Age-related nuclear cataract, bilateral: Secondary | ICD-10-CM | POA: Diagnosis not present

## 2019-05-30 DIAGNOSIS — E119 Type 2 diabetes mellitus without complications: Secondary | ICD-10-CM | POA: Diagnosis not present

## 2019-05-30 DIAGNOSIS — H40013 Open angle with borderline findings, low risk, bilateral: Secondary | ICD-10-CM | POA: Diagnosis not present

## 2019-05-30 DIAGNOSIS — H18513 Endothelial corneal dystrophy, bilateral: Secondary | ICD-10-CM | POA: Diagnosis not present

## 2019-05-30 LAB — HM DIABETES EYE EXAM

## 2019-05-31 ENCOUNTER — Encounter: Payer: Self-pay | Admitting: *Deleted

## 2019-06-07 MED FILL — REPATHA SURECLICK 140 MG/ML: 140 | 28 days supply | Qty: 2 | Fill #9

## 2019-06-20 MED FILL — METOPROLOL SUCCINATE ER 50: 50 | 30 days supply | Qty: 75 | Fill #2

## 2019-06-20 MED FILL — METHOTREXATE SODIUM 2.5 MG: 2.5 | 28 days supply | Qty: 32 | Fill #1

## 2019-06-28 ENCOUNTER — Other Ambulatory Visit: Payer: Self-pay

## 2019-06-28 ENCOUNTER — Ambulatory Visit: Payer: BC Managed Care – PPO | Admitting: Family Medicine

## 2019-06-28 VITALS — BP 118/76 | HR 97 | Temp 98.8°F | Resp 12 | Ht 63.5 in | Wt 119.2 lb

## 2019-06-28 DIAGNOSIS — N289 Disorder of kidney and ureter, unspecified: Secondary | ICD-10-CM | POA: Diagnosis not present

## 2019-06-28 DIAGNOSIS — I152 Hypertension secondary to endocrine disorders: Secondary | ICD-10-CM

## 2019-06-28 DIAGNOSIS — E119 Type 2 diabetes mellitus without complications: Secondary | ICD-10-CM

## 2019-06-28 DIAGNOSIS — M2550 Pain in unspecified joint: Secondary | ICD-10-CM | POA: Diagnosis not present

## 2019-06-28 DIAGNOSIS — M791 Myalgia, unspecified site: Secondary | ICD-10-CM | POA: Diagnosis not present

## 2019-06-28 DIAGNOSIS — E1159 Type 2 diabetes mellitus with other circulatory complications: Secondary | ICD-10-CM

## 2019-06-28 DIAGNOSIS — E7800 Pure hypercholesterolemia, unspecified: Secondary | ICD-10-CM

## 2019-06-28 DIAGNOSIS — I10 Essential (primary) hypertension: Secondary | ICD-10-CM | POA: Diagnosis not present

## 2019-06-28 DIAGNOSIS — M069 Rheumatoid arthritis, unspecified: Secondary | ICD-10-CM

## 2019-06-28 LAB — COMPREHENSIVE METABOLIC PANEL
ALT: 12 U/L (ref 0–35)
AST: 18 U/L (ref 0–37)
Albumin: 4.5 g/dL (ref 3.5–5.2)
Alkaline Phosphatase: 97 U/L (ref 39–117)
BUN: 21 mg/dL (ref 6–23)
CO2: 32 mEq/L (ref 19–32)
Calcium: 9.8 mg/dL (ref 8.4–10.5)
Chloride: 101 mEq/L (ref 96–112)
Creatinine, Ser: 0.91 mg/dL (ref 0.40–1.20)
GFR: 64.41 mL/min (ref >60.00)
Glucose, Bld: 82 mg/dL (ref 70–99)
Potassium: 3.4 mEq/L — ABNORMAL LOW (ref 3.5–5.1)
Sodium: 141 mEq/L (ref 135–145)
Total Bilirubin: 0.9 mg/dL (ref 0.2–1.2)
Total Protein: 6.8 g/dL (ref 6.0–8.3)

## 2019-06-28 LAB — CBC
HCT: 37.7 % (ref 36.0–46.0)
Hemoglobin: 12.9 g/dL (ref 12.0–15.0)
MCHC: 34.2 g/dL (ref 30.0–36.0)
MCV: 88.3 fl (ref 78.0–100.0)
Platelets: 217 10*3/uL (ref 150.0–400.0)
RBC: 4.27 Mil/uL (ref 3.87–5.11)
RDW: 13.3 % (ref 11.5–15.5)
WBC: 5.5 10*3/uL (ref 4.0–10.5)

## 2019-06-28 LAB — SEDIMENTATION RATE: Sed Rate: 8 mm/hr (ref 0–30)

## 2019-06-28 LAB — LIPID PANEL
Cholesterol: 152 mg/dL (ref 0–200)
HDL: 45.8 mg/dL (ref >39.00)
LDL Cholesterol: 76 mg/dL (ref 0–99)
NonHDL: 106.28
Total CHOL/HDL Ratio: 3
Triglycerides: 149 mg/dL (ref 0.0–149.0)
VLDL: 29.8 mg/dL (ref 0.0–40.0)

## 2019-06-28 LAB — TSH: TSH: 1.27 u[IU]/mL (ref 0.35–4.50)

## 2019-06-28 LAB — C-REACTIVE PROTEIN: CRP: <1 mg/dL (ref 0.5–20.0)

## 2019-06-28 NOTE — Assessment & Plan Note (Signed)
Encouraged heart healthy diet, increase exercise, avoid trans fats, consider a krill oil cap daily 

## 2019-06-28 NOTE — Assessment & Plan Note (Addendum)
Doing well. Following with Rheumatology, Dr Maude Leriche and they are questioning if she has fibromyalgia. She was referred to [facility], Dr Theodosia Blender but he is unclear if that is the diagnosis. She has now been referred to another specialist but she is unsure whom. They tried MTX it helped some but not enough. Exercise not able to do what she likes because it flares the pain. Has notable arthralgias and myalgias. Check labs today and consider referral for second opinion to second rheumatology practice.

## 2019-06-28 NOTE — Assessment & Plan Note (Signed)
Hydrate and monitor 

## 2019-06-28 NOTE — Assessment & Plan Note (Signed)
Well controlled, no changes to meds. Encouraged heart healthy diet such as the DASH diet and exercise as tolerated.  °

## 2019-06-28 NOTE — Progress Notes (Signed)
Subjective:    Patient ID: Marissa Guzman, female    DOB: May 24, 1965, 54 y.o.   MRN: [id number]  Chief Complaint  Patient presents with  . 6 month follow up    HPI Patient is in today for follow up on chronic medical concerns. She has been eating well and staying active. unfortunately she continues to struggle with daily and notable myalgias and arthralgias and worse with use. No recent fall or trauma. No redness or warmth over joints. Denies CP/palp/SOB/HA/congestion/fevers/GI or GU c/o. Taking meds as prescribed  Past Medical History:  Diagnosis Date  . Acute bronchitis 05/16/2012  . Allergic state 05/16/2012  . Allergy   . Constipation 04/16/2014  . Diabetes mellitus without complication ([place])   . DM2 (diabetes mellitus, type 2) ([place]) 08/10/2015  . Fibromyalgia   . GERD (gastroesophageal reflux disease) 06/12/2013  . Hip pain, bilateral 08/19/2015  . HTN (hypertension)   . Hyperglycemia 08/10/2015  . Hyperlipidemia   . Hypokalemia 11/14/2012  . Osteoarthritis   . Overweight 08/14/2016  . Rheumatoid arthritis ([place])   . Seasonal allergies     Past Surgical History:  Procedure Laterality Date  . CESAREAN SECTION    . HEMORRHOID SURGERY     two    Family History  Problem Relation Age of Onset  . Heart disease Father        Deceased  . Hypertension Father   . Lung cancer Father   . Melanoma Father        Eye Removal  . Hyperlipidemia Mother   . Hypertension Mother   . Colon polyps Mother   . Arthritis/Rheumatoid Mother   . Hyperlipidemia Maternal Grandmother   . Diabetes Maternal Grandmother   . Dementia Maternal Grandmother   . Hypertension Sister        x2  . Colon polyps Sister   . Lupus Sister   . Hyperlipidemia Sister        x2  . Colon polyps Sister   . Colon cancer Neg Hx   . Esophageal cancer Neg Hx   . Rectal cancer Neg Hx   . Stomach cancer Neg Hx     Social History   Socioeconomic History  . Marital status: Married    Spouse name: [place]  .  Number of children: 1  . Years of education: 24  . 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 Smoking status: Never Smoker  . Smokeless tobacco: Never Used  Substance and Sexual Activity  . Alcohol use: No  . Drug use: No  . 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 Not on file  Social History Narrative   Lives with husband   Caffeine- coffee 1 c   Social Determinants of Health   Financial Resource Strain:   . Difficulty of Paying Living Expenses:   Food Insecurity:   . Worried About Charity fundraiser in the Last Year:   . Arboriculturist in the Last Year:   Transportation Needs:   . Film/video editor (Medical):   Marland Kitchen Lack of Transportation (Non-Medical):   Physical Activity:   . Days of Exercise per Week:   . Minutes of Exercise per Session:   Stress:   . Feeling of Stress :   Social Connections:   .  Frequency of Communication with Friends and Family:   . Frequency of Social Gatherings with Friends and Family:   . Attends Religious Services:   . Active Member of Clubs or Organizations:   . Attends Archivist Meetings:   Marland Kitchen Marital Status:   Intimate Partner Violence:   . Fear of Current or Ex-Partner:   . Emotionally Abused:   Marland Kitchen Physically Abused:   . Sexually Abused:     Outpatient Medications Prior to Visit  Medication Sig Dispense Refill  . amitriptyline (ELAVIL) 10 MG tablet 20 mg at bedtime.    . blood glucose meter kit and supplies KIT Dispense based on patient and insurance preference. Check BS  daily as directed.  DzE11.9 1 each 0  . cetirizine (ZYRTEC) 10 MG tablet Take 10 mg by mouth daily as needed.    . Evolocumab (REPATHA SURECLICK) 762 MG/ML SOAJ Inject 1 Dose into the skin every 14 (fourteen) days. 2 pen 11  . fluticasone (FLONASE) 50 MCG/ACT nasal spray  Place 2 sprays into the nose daily as needed.    . folic acid (FOLVITE) 1 MG tablet     . Insulin Pen Needle 32G X 6 MM MISC Inject 1.82m into skin daily Dz E11.9 100 each 1  . methotrexate 2.5 MG tablet 2.5 mg every 7 (seven) days.    . metoprolol succinate (TOPROL-XL) 50 MG 24 hr tablet TAKE 1 & 1/2 TABLET BY MOUTH EVERY MORNING AND 1 TABLET IN THE EVENING WITH OR IMMEDIATELY FOLLOWING A MEAL 75 tablet 5  . naproxen (NAPROSYN) 500 MG tablet Take 500 mg by mouth 2 (two) times daily.    .Marland Kitchenomeprazole (PRILOSEC) 20 MG capsule Take 20 mg by mouth daily.    . ONE TOUCH ULTRA TEST test strip   0  . ONETOUCH DELICA LANCETS 383TMISC   0  . triamterene-hydrochlorothiazide (MAXZIDE-25) 37.5-25 MG tablet TAKE 1 TABLET BY MOUTH DAILY. 90 tablet 1  . VICTOZA 18 MG/3ML SOPN INJECT 0.2 MLS (1.2 MG TOTAL) INTO THE SKIN DAILY. 12 mL 3   No facility-administered medications prior to visit.    Allergies  Allergen Reactions  . Fenofibrate Other (See Comments)    myalgias  . Doxycycline Monohydrate     GI Issues.  . Niacin And Related     Extreme flushing despite taking it correctly  . Statins Other (See Comments)    Livalo, crestor myalgias  . Tetracyclines & Related Other (See Comments)    GI Issues.    Review of Systems  Constitutional: Negative for fever and malaise/fatigue.  HENT: Negative for congestion.   Eyes: Negative for blurred vision.  Respiratory: Negative for shortness of breath.   Cardiovascular: Negative for chest pain, palpitations and leg swelling.  Gastrointestinal: Negative for abdominal pain, blood in stool and nausea.  Genitourinary: Negative for dysuria and frequency.  Musculoskeletal: Negative for falls.  Skin: Negative for rash.  Neurological: Negative for dizziness, loss of consciousness and headaches.  Endo/Heme/Allergies: Negative for environmental allergies.  Psychiatric/Behavioral: Negative for depression. The patient is not nervous/anxious.        Objective:     Physical Exam Vitals and nursing note reviewed.  Constitutional:      General: She is not in acute distress.    Appearance: She is well-developed.  HENT:     Head: Normocephalic and atraumatic.     Nose: Nose normal.  Eyes:     General:        Right eye: No  discharge.        Left eye: No discharge.  Cardiovascular:     Rate and Rhythm: Normal rate and regular rhythm.     Heart sounds: No murmur.  Pulmonary:     Effort: Pulmonary effort is normal.     Breath sounds: Normal breath sounds.  Abdominal:     General: Bowel sounds are normal.     Palpations: Abdomen is soft.     Tenderness: There is no abdominal tenderness.  Musculoskeletal:     Cervical back: Normal range of motion and neck supple.  Skin:    General: Skin is warm and dry.  Neurological:     Mental Status: She is alert and oriented to person, place, and time.     BP 118/76 (BP Location: Right Arm, Cuff Size: Normal)   Pulse 97   Temp 98.8 F (37.1 C) (Temporal)   Resp 12   Ht 5' 3.5" (1.613 m)   Wt 119 lb 3.2 oz (54.1 kg)   SpO2 98%   BMI 20.78 kg/m  Wt Readings from Last 3 Encounters:  06/28/19 119 lb 3.2 oz (54.1 kg)  05/17/19 121 lb 3.2 oz (55 kg)  01/26/19 116 lb 3.2 oz (52.7 kg)    Diabetic Foot Exam - Simple   No data filed     Lab Results  Component Value Date   WBC 8.1 04/30/2018   HGB 12.2 04/30/2018   HCT 36.1 04/30/2018   PLT 292.0 04/30/2018   GLUCOSE 101 (H) 12/10/2018   CHOL 160 01/20/2019   TRIG 171 (H) 01/20/2019   HDL 54 01/20/2019   LDLDIRECT 88.0 12/10/2018   LDLCALC 77 01/20/2019   ALT 15 12/10/2018   AST 21 12/10/2018   NA 139 12/10/2018   K 4.0 12/10/2018   CL 99 12/10/2018   CREATININE 0.98 12/10/2018   BUN 20 12/10/2018   CO2 31 12/10/2018   TSH 2.24 04/30/2018   HGBA1C 5.3 05/17/2019    Lab Results  Component Value Date   TSH 2.24 04/30/2018   Lab Results  Component Value Date   WBC 8.1 04/30/2018   HGB 12.2 04/30/2018   HCT 36.1 04/30/2018    MCV 90.5 04/30/2018   PLT 292.0 04/30/2018   Lab Results  Component Value Date   NA 139 12/10/2018   K 4.0 12/10/2018   CO2 31 12/10/2018   GLUCOSE 101 (H) 12/10/2018   BUN 20 12/10/2018   CREATININE 0.98 12/10/2018   BILITOT 1.1 12/10/2018   ALKPHOS 75 12/10/2018   AST 21 12/10/2018   ALT 15 12/10/2018   PROT 6.6 05/17/2019   ALBUMIN 4.4 12/10/2018   CALCIUM 10.4 12/10/2018   GFR 59.25 (L) 12/10/2018   Lab Results  Component Value Date   CHOL 160 01/20/2019   Lab Results  Component Value Date   HDL 54 01/20/2019   Lab Results  Component Value Date   LDLCALC 77 01/20/2019   Lab Results  Component Value Date   TRIG 171 (H) 01/20/2019   Lab Results  Component Value Date   CHOLHDL 3.0 01/20/2019   Lab Results  Component Value Date   HGBA1C 5.3 05/17/2019       Assessment & Plan:   Problem List Items Addressed This Visit    Hypertension, benign essential, goal below 140/90    Well controlled, no changes to meds. Encouraged heart healthy diet such as the DASH diet and exercise as tolerated.       Hypercholesterolemia  Encouraged heart healthy diet, increase exercise, avoid trans fats, consider a krill oil cap daily      Relevant Orders   Lipid panel   DM2 (diabetes mellitus, type 2) ([facility])    hgba1c acceptable, minimize simple carbs. Increase exercise as tolerated. Continue current meds      Myalgia - Primary   Relevant Orders   C-reactive protein   Sedimentation rate   Rheumatoid Factor   Antinuclear Antib (ANA)   Renal insufficiency    Hydrate and monitor      Relevant Orders   Comprehensive metabolic panel   Rheumatoid arthritis ([place])    Doing well. Following with Rheumatology, Dr Titus Mould and they are questioning if she has fibromyalgia. She was referred to [place], Dr Abundio Miu but he is unclear if that is the diagnosis. She has now been referred to another specialist but she is unsure whom. They tried MTX it helped some but not enough. Exercise  not able to do what she likes because it flares the pain. Has notable arthralgias and myalgias. Check labs today and consider referral for second opinion to second rheumatology practice.       Hypertension associated with diabetes ([place])           Relevant Orders   CBC   Comprehensive metabolic panel   TSH    Other Visit Diagnoses    Arthralgia, unspecified joint       Relevant Orders   Rheumatoid Factor   Antinuclear Antib (ANA)      I am having Ruchy B. Pun maintain her fluticasone, cetirizine, omeprazole, blood glucose meter kit and supplies, ONE TOUCH ULTRA TEST, OneTouch Delica Lancets 23N, Repatha SureClick, Insulin Pen Needle, folic acid, triamterene-hydrochlorothiazide, Victoza, metoprolol succinate, amitriptyline, methotrexate, and naproxen.  No orders of the defined types were placed in this encounter.    Penni Homans, MD

## 2019-06-28 NOTE — Assessment & Plan Note (Signed)
hgba1c acceptable, minimize simple carbs. Increase exercise as tolerated. Continue current meds 

## 2019-06-28 NOTE — Patient Instructions (Addendum)
Omron Blood Pressure cuff, upper arm, want BP 100-140/60-90 Pulse oximeter, want oxygen in 90s  Weekly vitals  Take Multivitamin with minerals, selenium Vitamin D 1000-2000 IU daily Probiotic with lactobacillus and bifidophilus Asprin EC 81 mg daily Fish and krill oil daily  Melatonin 2-5 mg at bedtime  Curcumen/Turmeric daily [email or web address]

## 2019-06-29 MED FILL — VICTOZA 2-PAK 18 MG/3 ML PE: 18 | 60 days supply | Qty: 12 | Fill #2

## 2019-06-30 ENCOUNTER — Other Ambulatory Visit: Payer: Self-pay | Admitting: Family Medicine

## 2019-06-30 DIAGNOSIS — R768 Other specified abnormal immunological findings in serum: Secondary | ICD-10-CM

## 2019-06-30 DIAGNOSIS — M2550 Pain in unspecified joint: Secondary | ICD-10-CM

## 2019-06-30 DIAGNOSIS — R7689 Other specified abnormal immunological findings in serum: Secondary | ICD-10-CM

## 2019-06-30 DIAGNOSIS — M791 Myalgia, unspecified site: Secondary | ICD-10-CM

## 2019-06-30 LAB — ANTI-NUCLEAR AB-TITER (ANA TITER)
ANA TITER: 1:80 {titer} — ABNORMAL HIGH
ANA Titer 1: 1:40 {titer} — ABNORMAL HIGH

## 2019-06-30 LAB — ANA: Anti Nuclear Antibody (ANA): POSITIVE — AB

## 2019-06-30 LAB — RHEUMATOID FACTOR: Rheumatoid fact SerPl-aCnc: <14 IU/mL (ref <14)

## 2019-07-01 ENCOUNTER — Telehealth: Payer: Self-pay | Admitting: Internal Medicine

## 2019-07-01 NOTE — Telephone Encounter (Signed)
PA renewal for Repatha Sureclick submitted via CMM (Key: [id number])

## 2019-07-01 NOTE — Telephone Encounter (Signed)
PA that was generated via CMM and submitted has been "cancelled"  Per CMM: This may mean either your patient does not have active coverage with this plan, this authorization was processed as a duplicate request, or an authorization was not needed for this medication.

## 2019-07-06 MED FILL — REPATHA SURECLICK 140 MG/ML: 140 | 28 days supply | Qty: 2 | Fill #10

## 2019-07-08 ENCOUNTER — Telehealth: Payer: Self-pay | Admitting: Family Medicine

## 2019-07-08 NOTE — Telephone Encounter (Signed)
Patient's referral to [id number] - AMB REFERRAL TO RHEUMATOLOGY   Was denied

## 2019-07-11 ENCOUNTER — Other Ambulatory Visit: Payer: Self-pay | Admitting: Family Medicine

## 2019-07-11 MED FILL — FOLIC ACID 1 MG TABS: 1 | 30 days supply | Qty: 30 | Fill #1

## 2019-07-11 MED FILL — METHOTREXATE 2.5 MG TAB: 2.5 | 28 days supply | Qty: 32 | Fill #1

## 2019-07-11 MED FILL — TRIAMTERENE-HCTZ 37.5-25 MG: 37.5-25 | 90 days supply | Qty: 90 | Fill #0

## 2019-07-11 MED FILL — AMITRIPTYLINE HCL 10 MG TAB: 10 | 30 days supply | Qty: 60 | Fill #1

## 2019-07-11 NOTE — Telephone Encounter (Signed)
Looks like patient has been declined by Dr. Corliss Guzman office as well.  Any other recommendations?   Marissa Guzman D, CMA  Yes, pt has been denied by [facility] and now Dr. Corliss Guzman at [facility] Rheumatology. Please advise.   From [facility] Rhe[facility] "Rejection Reason - Other - We appreciate your referral. Each referral is reviewed by our providers to ensure that the patient will receive the best medical care possible. Upon review of this referral Dr. Corliss Guzman has declined it. "

## 2019-07-11 NOTE — Telephone Encounter (Signed)
Would you need a new referral?

## 2019-07-11 NOTE — Telephone Encounter (Signed)
She will have to try [facility], [facility] or [facility] I guess. There might be a private rheumatologist in [place] but not sure. See what her preference

## 2019-07-11 NOTE — Telephone Encounter (Signed)
According to [employer] it was denied on Friday - see referral msg I sent you.

## 2019-07-11 NOTE — Telephone Encounter (Signed)
Not sure about this message, but did Dr. Corliss Skains deny her too?  Can you review when you get a chance.

## 2019-07-12 NOTE — Telephone Encounter (Signed)
No, I did not need a new referral.   I have faxed it to Dr. Francee Gentile at [facility] in [facility]. [address] [place], [place] [address] [employer]  Phone: [phone]   Fax: [phone]

## 2019-07-13 NOTE — Telephone Encounter (Signed)
Patient stated that she will continue to see her regular rheumatologist, because she feels that maybe she is trying to figure something out.  Her rheumatologist referred her to a neurologist who she will be seeing on 6/28.  She also follows up with rheumatologist again on 6/1.  She will see what happens with the month of June and if needed she will call us back to send referral again.  I advised her that they have already sent the referral and if she gets a phone call to just decline.

## 2019-08-18 MED FILL — REPATHA SURECLICK 140 MG/ML: 140 | 28 days supply | Qty: 2 | Fill #11

## 2019-08-18 MED FILL — FOLIC ACID 1 MG TABS: 1 | 30 days supply | Qty: 30 | Fill #1

## 2019-08-19 MED FILL — METHOTREXATE SODIUM 2.5 MG: 2.5 | 28 days supply | Qty: 32 | Fill #0

## 2019-08-19 MED FILL — METOPROLOL SUCCINATE ER 50: 50 | 30 days supply | Qty: 75 | Fill #4

## 2019-08-30 DIAGNOSIS — M797 Fibromyalgia: Secondary | ICD-10-CM | POA: Diagnosis not present

## 2019-08-30 DIAGNOSIS — M069 Rheumatoid arthritis, unspecified: Secondary | ICD-10-CM | POA: Diagnosis not present

## 2019-08-30 DIAGNOSIS — Z79899 Other long term (current) drug therapy: Secondary | ICD-10-CM | POA: Diagnosis not present

## 2019-08-30 DIAGNOSIS — M1990 Unspecified osteoarthritis, unspecified site: Secondary | ICD-10-CM | POA: Diagnosis not present

## 2019-08-30 MED FILL — VICTOZA 2-PAK 18 MG/3 ML PE: 18 | 60 days supply | Qty: 12 | Fill #3

## 2019-09-08 ENCOUNTER — Telehealth: Payer: Self-pay | Admitting: Internal Medicine

## 2019-09-08 NOTE — Telephone Encounter (Signed)
PA renewal for Repatha submitted via CMM (Key: [id number])

## 2019-09-09 NOTE — Progress Notes (Signed)
NEUROLOGY CONSULTATION NOTE  JAMA MCMILLER MRN: [id number] DOB: 04/20/1965  Referring provider: Sabino Niemann, MD Primary care provider: Penni Homans, MD  Reason for consult:  Numbness and tingling  HISTORY OF PRESENT ILLNESS: Marissa Guzman is a 54 year old right-handed female with type 2 diabetes mellitus and osteoarthritis who presents for numbness and tingling.  History supplemented by prior neurologist's and referring provider's notes.   She was diagnosed with type 2 diabetes in 2019 with a Hgb A1c of 7.2.  About a year later, she began experiencing muscle pain and weakness in the arms and legs.  Picking up objects caused pain.  She also began experiencing pain in the elbows and knees.  Anything just grazing her elbows caused pain.  She also endorses numbness and tingling on the heel and the big toe of her right foot.  Sometimes non-radiating back pain but no radicular pain down the leg.  No rash or skin discoloration.  She was evaluated by rheumatology and diagnosed with rheumatoid arthritis, however this diagnosis has since been called into question.  In 2020, found to be B12 deficient with level of 210.  Started on supplementation.    Labs: 06/28/2019 positive ANA 1:80, RF non-reactive, sed rate 8, CRP negative, TSH 1.27 05/17/2019 B12 409, Hgb A1c 5.3, SPEP/IFE negative for m-spike, CK 60, aldolase 4.  PAST MEDICAL HISTORY: Past Medical History:  Diagnosis Date  . Acute bronchitis 05/16/2012  . Allergic state 05/16/2012  . Allergy   . Constipation 04/16/2014  . Diabetes mellitus without complication ([place])   . DM2 (diabetes mellitus, type 2) ([place]) 08/10/2015  . Fibromyalgia   . GERD (gastroesophageal reflux disease) 06/12/2013  . Hip pain, bilateral 08/19/2015  . HTN (hypertension)   . Hyperglycemia 08/10/2015  . Hyperlipidemia   . Hypokalemia 11/14/2012  . Osteoarthritis   . Overweight 08/14/2016  . Rheumatoid arthritis ([place])   . Seasonal allergies     PAST SURGICAL  HISTORY: Past Surgical History:  Procedure Laterality Date  . CESAREAN SECTION    . HEMORRHOID SURGERY     two    MEDICATIONS: Current Outpatient Medications on File Prior to Visit  Medication Sig Dispense Refill  . amitriptyline (ELAVIL) 10 MG tablet 20 mg at bedtime.    . blood glucose meter kit and supplies KIT Dispense based on patient and insurance preference. Check BS  daily as directed.  DzE11.9 1 each 0  . cetirizine (ZYRTEC) 10 MG tablet Take 10 mg by mouth daily as needed.    . Evolocumab (REPATHA SURECLICK) 623 MG/ML SOAJ Inject 1 Dose into the skin every 14 (fourteen) days. 2 pen 11  . fluticasone (FLONASE) 50 MCG/ACT nasal spray Place 2 sprays into the nose daily as needed.    . folic acid (FOLVITE) 1 MG tablet     . Insulin Pen Needle 32G X 6 MM MISC Inject 1.70m into skin daily Dz E11.9 100 each 1  . methotrexate 2.5 MG tablet 2.5 mg every 7 (seven) days.    . metoprolol succinate (TOPROL-XL) 50 MG 24 hr tablet TAKE 1 & 1/2 TABLET BY MOUTH EVERY MORNING AND 1 TABLET IN THE EVENING WITH OR IMMEDIATELY FOLLOWING A MEAL 75 tablet 5  . naproxen (NAPROSYN) 500 MG tablet Take 500 mg by mouth 2 (two) times daily.    .Marland Kitchenomeprazole (PRILOSEC) 20 MG capsule Take 20 mg by mouth daily.    . ONE TOUCH ULTRA TEST test strip   0  . [facility] State EndoscopyDELICA  LANCETS 33G MISC   0  . triamterene-hydrochlorothiazide (MAXZIDE-25) 37.5-25 MG tablet TAKE 1 TABLET BY MOUTH DAILY. 90 tablet 1  . VICTOZA 18 MG/3ML SOPN INJECT 0.2 MLS (1.2 MG TOTAL) INTO THE SKIN DAILY. 12 mL 3   No current facility-administered medications on file prior to visit.    ALLERGIES: Allergies  Allergen Reactions  . Fenofibrate Other (See Comments)    myalgias  . Doxycycline Monohydrate     GI Issues.  . Niacin And Related     Extreme flushing despite taking it correctly  . Statins Other (See Comments)    Livalo, crestor myalgias  . Tetracyclines & Related Other (See Comments)    GI Issues.    FAMILY  HISTORY: Family History  Problem Relation Age of Onset  . Heart disease Father        Deceased  . Hypertension Father   . Lung cancer Father   . Melanoma Father        Eye Removal  . Hyperlipidemia Mother   . Hypertension Mother   . Colon polyps Mother   . Arthritis/Rheumatoid Mother   . Hyperlipidemia Maternal Grandmother   . Diabetes Maternal Grandmother   . Dementia Maternal Grandmother   . Hypertension Sister        x2  . Colon polyps Sister   . Lupus Sister   . Hyperlipidemia Sister        x2  . Colon polyps Sister   . Colon cancer Neg Hx   . Esophageal cancer Neg Hx   . Rectal cancer Neg Hx   . Stomach cancer Neg Hx    SOCIAL HISTORY: Social History   Socioeconomic History  . Marital status: Married    Spouse name: [place]  . Number of children: 1  . Years of education: 51  . 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 Smoking status: Never Smoker  . Smokeless tobacco: Never Used  Vaping Use  . Vaping Use: Never used  Substance and Sexual Activity  . Alcohol use: No  . Drug use: No  . 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 Not on file  Social History Narrative   Lives with husband   Caffeine- coffee 1 c   Social Determinants of Health   Financial Resource Strain:   . Difficulty of Paying Living Expenses:   Food Insecurity:   . Worried About Charity fundraiser in the Last Year:   . Arboriculturist in the Last Year:   Transportation Needs:   . Film/video editor (Medical):   Marland Kitchen Lack of Transportation (Non-Medical):   Physical Activity:   . Days of Exercise per Week:   . Minutes of Exercise per Session:   Stress:   . Feeling of Stress :   Social Connections:   . Frequency of Communication with Friends and Family:   . Frequency of Social Gatherings with Friends  and Family:   . Attends Religious Services:   . Active Member of Clubs or Organizations:   . Attends Archivist Meetings:   Marland Kitchen Marital Status:   Intimate Partner Violence:   . Fear of Current or Ex-Partner:   . Emotionally Abused:   Marland Kitchen Physically Abused:   . Sexually Abused:     PHYSICAL EXAM:  Blood pressure 128/84, pulse 85, height _0  (1.6 m), weight 117 lb 8 oz (53.3 kg), SpO2 99 %. General: No acute distress.  Patient appears well-groomed.   Head:  Normocephalic/atraumatic Eyes:  fundi examined but not visualized Neck: supple, no paraspinal tenderness, full range of motion Back: No paraspinal tenderness Heart: regular rate and rhythm Lungs: Clear to auscultation bilaterally. Vascular: No carotid bruits. Neurological Exam: Mental status: alert and oriented to person, place, and time, recent and remote memory intact, fund of knowledge intact, attention and concentration intact, speech fluent and not dysarthric, language intact. Cranial nerves: CN I: not tested CN II: pupils equal, round and reactive to light, visual fields intact CN III, IV, VI:  full range of motion, no nystagmus, no ptosis CN V: facial sensation intact CN VII: upper and lower face symmetric CN VIII: hearing intact CN IX, X: gag intact, uvula midline CN XI: sternocleidomastoid and trapezius muscles intact CN XII: tongue midline Bulk & Tone: normal, no fasciculations. Motor:  5/5 throughout Sensation:  Pinprick and vibration sensation intact.   Deep Tendon Reflexes:  2+ throughout; toes downgoing.   Finger to nose testing:  Without dysmetria.   Heel to shin:  Without dysmetria.   Gait:  Normal station and stride.  Able to turn and tandem walk. Romberg .negative  IMPRESSION: Myalgias Arthralgia Right foot numbness, possibly radiculopathy (L5-S1) Pretty extensive workup thus far.    PLAN: NCV-EMG of lower extremities Further recommendations pending results.  Thank you for allowing me to  take part in the care of this patient.  Metta Clines, DO  CC:  Penni Homans, MD  Sabino Niemann, MD

## 2019-09-12 ENCOUNTER — Ambulatory Visit: Payer: BC Managed Care – PPO | Admitting: Neurology

## 2019-09-12 ENCOUNTER — Other Ambulatory Visit: Payer: Self-pay

## 2019-09-12 ENCOUNTER — Encounter: Payer: Self-pay | Admitting: Neurology

## 2019-09-12 VITALS — BP 128/84 | HR 85 | Ht 63.0 in | Wt 117.5 lb

## 2019-09-12 DIAGNOSIS — M2559 Pain in other specified joint: Secondary | ICD-10-CM | POA: Diagnosis not present

## 2019-09-12 DIAGNOSIS — M791 Myalgia, unspecified site: Secondary | ICD-10-CM

## 2019-09-12 DIAGNOSIS — R200 Anesthesia of skin: Secondary | ICD-10-CM

## 2019-09-12 DIAGNOSIS — R202 Paresthesia of skin: Secondary | ICD-10-CM | POA: Diagnosis not present

## 2019-09-12 NOTE — Patient Instructions (Signed)
1.  We will check nerve conduction study of bilateral lower extremities.  Further recommendations pending results.

## 2019-09-13 MED FILL — FOLIC ACID 1 MG TABS: 1 | 30 days supply | Qty: 30 | Fill #0

## 2019-09-13 MED FILL — AMITRIPTYLINE HCL 10 MG TAB: 10 | 30 days supply | Qty: 60 | Fill #2

## 2019-09-13 MED FILL — METHOTREXATE SODIUM 2.5 MG: 2.5 | 28 days supply | Qty: 32 | Fill #0

## 2019-09-15 NOTE — Telephone Encounter (Signed)
Approved  Effective from 09/08/2019 through 09/06/2020

## 2019-09-20 ENCOUNTER — Other Ambulatory Visit: Payer: Self-pay | Admitting: Internal Medicine

## 2019-09-20 MED FILL — REPATHA SURECLICK 140 MG/ML: 140 | 28 days supply | Qty: 2 | Fill #0

## 2019-09-29 ENCOUNTER — Other Ambulatory Visit: Payer: Self-pay | Admitting: *Deleted

## 2019-09-29 DIAGNOSIS — E7801 Familial hypercholesterolemia: Secondary | ICD-10-CM

## 2019-09-29 NOTE — Progress Notes (Signed)
lipid

## 2019-09-30 LAB — HEPATIC FUNCTION PANEL
ALT: 14 IU/L (ref 0–32)
AST: 21 IU/L (ref 0–40)
Albumin: 4.7 g/dL (ref 3.8–4.9)
Alkaline Phosphatase: 90 IU/L (ref 48–121)
Bilirubin Total: 1.1 mg/dL (ref 0.0–1.2)
Bilirubin, Direct: 0.25 mg/dL (ref 0.00–0.40)
Total Protein: 6.9 g/dL (ref 6.0–8.5)

## 2019-09-30 LAB — LIPID PANEL
Chol/HDL Ratio: 3.4 ratio (ref 0.0–4.4)
Cholesterol, Total: 159 mg/dL (ref 100–199)
HDL: 47 mg/dL (ref >39)
LDL Chol Calc (NIH): 86 mg/dL (ref 0–99)
Triglycerides: 148 mg/dL (ref 0–149)
VLDL Cholesterol Cal: 26 mg/dL (ref 5–40)

## 2019-10-03 MED FILL — METOPROLOL SUCCINATE ER 50: 50 | 30 days supply | Qty: 75 | Fill #5

## 2019-10-05 ENCOUNTER — Ambulatory Visit: Payer: BLUE CROSS/BLUE SHIELD | Admitting: Internal Medicine

## 2019-10-05 ENCOUNTER — Other Ambulatory Visit: Payer: Self-pay

## 2019-10-05 ENCOUNTER — Encounter: Payer: Self-pay | Admitting: Internal Medicine

## 2019-10-05 VITALS — BP 120/82 | HR 96 | Ht 63.5 in | Wt 113.4 lb

## 2019-10-05 DIAGNOSIS — Z789 Other specified health status: Secondary | ICD-10-CM | POA: Diagnosis not present

## 2019-10-05 DIAGNOSIS — E7801 Familial hypercholesterolemia: Secondary | ICD-10-CM

## 2019-10-05 DIAGNOSIS — R931 Abnormal findings on diagnostic imaging of heart and coronary circulation: Secondary | ICD-10-CM | POA: Diagnosis not present

## 2019-10-05 NOTE — Patient Instructions (Signed)
Medication Instructions:  Your physician recommends that you continue on your current medications as directed. Please refer to the Current Medication list given to you today.  *If you need a refill on your cardiac medications before your next appointment, please call your pharmacy*   Lab Work: FASTING lipid panel before next appointment with Dr. Rennis Golden   If you have labs (blood work) drawn today and your tests are completely normal, you will receive your results only by: Marland Kitchen MyChart Message (if you have MyChart) OR . A paper copy in the mail If you have any lab test that is abnormal or we need to change your treatment, we will call you to review the results.   Testing/Procedures: NONE   Follow-Up: At [facility], you and your health needs are our priority.  As part of our continuing mission to provide you with exceptional heart care, we have created designated Provider Care Teams.  These Care Teams include your primary Cardiologist (physician) and Advanced Practice Providers (APPs -  Physician Assistants and Nurse Practitioners) who all work together to provide you with the care you need, when you need it.  We recommend signing up for the patient portal called "MyChart".  Sign up information is provided on this After Visit Summary.  MyChart is used to connect with patients for Virtual Visits (Telemedicine).  Patients are able to view lab/test results, encounter notes, upcoming appointments, etc.  Non-urgent messages can be sent to your provider as well.   To learn more about what you can do with MyChart, go to [email or web address].    Your next appointment:   12 month(s) - lipid clinic  The format for your next appointment:   In Person or Virtual  Provider:   K. [place] Hilty, MD   Other Instructions

## 2019-10-05 NOTE — Progress Notes (Signed)
LIPID CLINIC NOTE  Chief Complaint:  Follow-up dyslipidemia  Primary Care Physician: [name], MD  Primary Cardiologist:  No primary care provider on file.  HPI:  Marissa Guzman is a 54 y.o. female who is being seen today for the evaluation of dyslipidemia at the request of [name], MD. Marissa Guzman is a pleasant 54 year old female kindly referred by Dr. Charlett Blake for evaluation of dyslipidemia.  Medical history significant for type 2 diabetes, and, dyslipidemia and apparent carotid bruits in the past with normal Dopplers.  She is also been seen by Dr. Geraldo Pitter in May 2019.  History of heart disease in the family including both of her parents, but particularly more in her mother side including her mother, maternal grandmother, and sisters.  When queried more about this she mentioned that significant elevations in cholesterol are present in her sisters as well as her mother.  She has known about high cholesterol for some time.  Most recent labs a month ago showed a direct LDL of 186.  At that time her total cholesterol was 278, triglycerides 210 and a non-HDL cholesterol of 240.  In the past she is also struggled with high triglycerides, namely 4 months ago her triglycerides were close to 600.  Since then, she has been on numerous medications which were not tolerated and thought to cause myalgias, including atorvastatin, rosuvastatin, pitavastatin, fenofibrate, ezetimibe, Vascepa and niacin. Currently she is not on therapy. I have repeated her lipid study and her total cholesterol now is 404, TG 195, HDL 37 and LDL of 328 (direct 332). 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  She has no known coronary disease history.  01/26/2019  Zonnie returns today for follow-up.  Overall she is doing very well.  She is tolerating Repatha and has had excellent results.  Her cholesterol has come down significantly.  Her last total cholesterol was 278.  Now she is down to  160 with triglycerides 171, HDL 54 and LDL 77.  This puts her at a near goal for aggressive therapy.  Her coronary calcium score over the summer was only 22 which is reassuring and gives [name] more reason to try to be aggressive with therapy.  She was sent for genetic testing because of high concern of a familial hyperlipidemia.  Blood work was sent out however we have not yet had a result of her genetic test.  I will reach out to Dr. Broadus John for that information.  She is interested in potentially screening family members if we are successful in identifying an abnormal gene.  10/05/2019  Marissa Guzman is seen today in follow-up.  She continues to do well on Repatha with good clinical response.  Her total cholesterol a few days ago was 159, HDL 47, LDL 76 and triglycerides 148.  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  We have encouraged screening of other family members with this.  She remains asymptomatic.  PMHx:  Past Medical History:  Diagnosis Date  . Acute bronchitis 05/16/2012  . Allergic state 05/16/2012  . Allergy   . Constipation 04/16/2014  . Diabetes mellitus without complication ([place])   . DM2 (diabetes mellitus, type 2) ([place]) 08/10/2015  . Fibromyalgia   . GERD (gastroesophageal reflux disease) 06/12/2013  . Hip pain, bilateral 08/19/2015  . HTN (hypertension)   . Hyperglycemia 08/10/2015  . Hyperlipidemia   . Hypokalemia 11/14/2012  . Osteoarthritis   . Overweight 08/14/2016  . Rheumatoid arthritis (  [place])   . Seasonal allergies     Past Surgical History:  Procedure Laterality Date  . CESAREAN SECTION    . HEMORRHOID SURGERY     two    FAMHx:  Family History  Problem Relation Age of Onset  . Heart disease Father        Deceased  . Hypertension Father   . Lung cancer Father   . Melanoma Father        Eye Removal  . Hyperlipidemia Mother   . Hypertension Mother   . Colon polyps Mother   . Arthritis/Rheumatoid Mother   . Hyperlipidemia Maternal  Grandmother   . Diabetes Maternal Grandmother   . Dementia Maternal Grandmother   . Hypertension Sister        x2  . Colon polyps Sister   . Lupus Sister   . Hyperlipidemia Sister        x2  . Colon polyps Sister   . Colon cancer Neg Hx   . Esophageal cancer Neg Hx   . Rectal cancer Neg Hx   . Stomach cancer Neg Hx     SOCHx:   reports that she has never smoked. She has never used smokeless tobacco. She reports that she does not drink alcohol and does not use drugs.  ALLERGIES:  Allergies  Allergen Reactions  . Fenofibrate Other (See Comments)    myalgias  . Doxycycline Monohydrate     GI Issues.  . Niacin And Related     Extreme flushing despite taking it correctly  . Statins Other (See Comments)    Livalo, crestor myalgias  . Tetracyclines & Related Other (See Comments)    GI Issues.    ROS: Pertinent items noted in HPI and remainder of comprehensive ROS otherwise negative.  HOME MEDS: Current Outpatient Medications on File Prior to Visit  Medication Sig Dispense Refill  . amitriptyline (ELAVIL) 10 MG tablet 20 mg at bedtime.    . blood glucose meter kit and supplies KIT Dispense based on patient and insurance preference. Check BS  daily as directed.  DzE11.9 1 each 0  . cetirizine (ZYRTEC) 10 MG tablet Take 10 mg by mouth daily as needed.    . fluticasone (FLONASE) 50 MCG/ACT nasal spray Place 2 sprays into the nose daily as needed.    . folic acid (FOLVITE) 1 MG tablet     . Insulin Pen Needle 32G X 6 MM MISC Inject 1.69m into skin daily Dz E11.9 100 each 1  . methotrexate 2.5 MG tablet 2.5 mg every 7 (seven) days.    . metoprolol succinate (TOPROL-XL) 50 MG 24 hr tablet TAKE 1 & 1/2 TABLET BY MOUTH EVERY MORNING AND 1 TABLET IN THE EVENING WITH OR IMMEDIATELY FOLLOWING A MEAL 75 tablet 5  . naproxen (NAPROSYN) 500 MG tablet Take 500 mg by mouth 2 (two) times daily.     .Marland Kitchenomeprazole (PRILOSEC) 20 MG capsule Take 20 mg by mouth daily.    . ONE TOUCH ULTRA TEST  test strip   0  . ONETOUCH DELICA LANCETS 327MMISC   0  . REPATHA SURECLICK 1786MG/ML SOAJ INJECT 1 DOSE INTO THE SKIN EVERY 14 DAYS 2 mL 4  . triamterene-hydrochlorothiazide (MAXZIDE-25) 37.5-25 MG tablet TAKE 1 TABLET BY MOUTH DAILY. 90 tablet 1  . VICTOZA 18 MG/3ML SOPN INJECT 0.2 MLS (1.2 MG TOTAL) INTO THE SKIN DAILY. 12 mL 3   No current facility-administered medications on file prior to visit.  LABS/IMAGING: No results found for this or any previous visit (from the past 48 hour(s)). No results found.  LIPID PANEL:    Component Value Date/Time   CHOL 159 09/29/2019 1458   CHOL 185 11/29/2013 0911   TRIG 148 09/29/2019 1458   TRIG 160 (H) 11/29/2013 0911   HDL 47 09/29/2019 1458   HDL 37 (L) 11/29/2013 0911   CHOLHDL 3.4 09/29/2019 1458   CHOLHDL 3 06/28/2019 1004   VLDL 29.8 06/28/2019 1004   LDLCALC 86 09/29/2019 1458   LDLCALC 116 (H) 11/29/2013 0911   LDLDIRECT 88.0 12/10/2018 0922    WEIGHTS: Wt Readings from Last 3 Encounters:  10/05/19 113 lb 6.4 oz (51.4 kg)  09/12/19 117 lb 8 oz (53.3 kg)  06/28/19 119 lb 3.2 oz (54.1 kg)    VITALS: BP 120/82   Pulse 96   Ht 5' 3.5" (1.613 m)   Wt 113 lb 6.4 oz (51.4 kg)   SpO2 98%   BMI 19.77 kg/m   EXAM: Deferred  EKG: Deferred  ASSESSMENT: 1. 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 Family history of premature CAD 3. Statin intolerance 4. Elevated CAC score of 22 (09/2018)  PLAN: 1.  Ms. Striplin continues to respond well clinically with a PCSK9 inhibitor despite mutation in the PCSK9 gene.  I would recommend continued aggressive therapy, lifestyle and exercise management as recently prescribed.  Follow-up with me annually or sooner as necessary.  Pixie Casino, MD, [facility], [place] Director of the Advanced Lipid Disorders &  Cardiovascular Risk Reduction Clinic Diplomate of the American Board of Clinical  Lipidology Attending Cardiologist  Direct Dial: [phone]  Fax: [phone]  Website:  [email or web address] [email or web address] Tremane Spurgeon 10/05/2019, 11:28 AM

## 2019-10-12 DIAGNOSIS — L578 Other skin changes due to chronic exposure to nonionizing radiation: Secondary | ICD-10-CM | POA: Diagnosis not present

## 2019-10-12 DIAGNOSIS — L821 Other seborrheic keratosis: Secondary | ICD-10-CM | POA: Diagnosis not present

## 2019-10-12 DIAGNOSIS — L814 Other melanin hyperpigmentation: Secondary | ICD-10-CM | POA: Diagnosis not present

## 2019-10-12 DIAGNOSIS — D225 Melanocytic nevi of trunk: Secondary | ICD-10-CM | POA: Diagnosis not present

## 2019-10-17 MED FILL — TRIAMTERENE-HCTZ 37.5-25 MG: 37.5-25 | 90 days supply | Qty: 90 | Fill #1

## 2019-10-17 MED FILL — METHOTREXATE 2.5 MG TAB: 2.5 | 84 days supply | Qty: 96 | Fill #0

## 2019-10-17 MED FILL — FOLIC ACID 1 MG TABS: 1 | 90 days supply | Qty: 90 | Fill #0

## 2019-10-24 MED FILL — REPATHA SURECLICK 140 MG/ML: 140 | 28 days supply | Qty: 2 | Fill #1

## 2019-10-31 ENCOUNTER — Other Ambulatory Visit: Payer: Self-pay | Admitting: Family Medicine

## 2019-10-31 MED FILL — VICTOZA 2-PAK 18 MG/3 ML PE: 18 | 60 days supply | Qty: 12 | Fill #0

## 2019-11-01 ENCOUNTER — Other Ambulatory Visit: Payer: Self-pay | Admitting: Family Medicine

## 2019-11-01 DIAGNOSIS — I10 Essential (primary) hypertension: Secondary | ICD-10-CM

## 2019-11-01 MED FILL — METOPROLOL SUCCINATE ER 50: 50 | 30 days supply | Qty: 75 | Fill #0

## 2019-11-08 ENCOUNTER — Ambulatory Visit (INDEPENDENT_AMBULATORY_CARE_PROVIDER_SITE_OTHER): Payer: BLUE CROSS/BLUE SHIELD | Admitting: Neurology

## 2019-11-08 ENCOUNTER — Other Ambulatory Visit: Payer: Self-pay

## 2019-11-08 DIAGNOSIS — R200 Anesthesia of skin: Secondary | ICD-10-CM

## 2019-11-08 DIAGNOSIS — R202 Paresthesia of skin: Secondary | ICD-10-CM | POA: Diagnosis not present

## 2019-11-08 NOTE — Procedures (Signed)
[facility]  [address] [place], [address]  [place], [place] [address] Tel: [phone] Fax:  [phone] Test Date:  11/08/2019  Patient: Marissa Guzman DOB: [birth year] Physician: Nita Sickle, DO  Sex: Female Height: 5\' 3"  Ref Phys: , D.O.  ID#: Shon Millet Temp: 32.0C Technician:    Patient Complaints: This is a 54 year old female referred for evaluation of bilateral feet paresthesias.  NCV & EMG Findings: Electrodiagnostic testing of the right lower extremity and additional studies of the left shows: 1. Bilateral sural and superficial peroneal sensory responses are within normal limits. 2. Bilateral peroneal and tibial motor responses are within normal limits. 3. Bilateral tibial H reflex studies are within normal limits. 4. There is no evidence of active or chronic motor axonal changes affecting any of the tested muscles.  Motor unit configuration and recruitment pattern is within normal limits.   Impression: This is a normal study of the lower extremities.  In particular, there is no evidence of a sensorimotor polyneuropathy or lumbosacral radiculopathy.     ___________________________ 57, DO    Nerve Conduction Studies Anti Sensory Summary Table   Stim Site NR Peak (ms) Norm Peak (ms) P-T Amp (V) Norm P-T Amp  Left Sup Peroneal Anti Sensory (Ant Lat Mall)  32C  12 cm    2.7 <4.6 5.8 >4  Right Sup Peroneal Anti Sensory (Ant Lat Mall)  32C  12 cm    2.3 <4.6 8.0 >4  Left Sural Anti Sensory (Lat Mall)  32C  Calf    3.2 <4.6 8.8 >4  Right Sural Anti Sensory (Lat Mall)  32C  Calf    2.7 <4.6 8.7 >4   Motor Summary Table   Stim Site NR Onset (ms) Norm Onset (ms) O-P Amp (mV) Norm O-P Amp Site1 Site2 Delta-0 (ms) Dist (cm) Vel (m/s) Norm Vel (m/s)  Left Peroneal Motor (Ext Dig Brev)  32C  Ankle    2.9 <6.0 3.2 >2.5 B Fib Ankle 7.0 35.0 50 >40  B Fib    9.9  3.0  Poplt B Fib 1.6 8.0 50 >40  Poplt    11.5  2.9         Right Peroneal  Motor (Ext Dig Brev)  32C  Ankle    2.7 <6.0 3.8 >2.5 B Fib Ankle 6.8 36.0 53 >40  B Fib    9.5  3.6  Poplt B Fib 1.5 8.0 53 >40  Poplt    11.0  3.5         Left Tibial Motor (Abd Hall Brev)  32C  Ankle    4.2 <6.0 11.3 >4 Knee Ankle 7.5 37.0 49 >40  Knee    11.7  8.7         Right Tibial Motor (Abd Hall Brev)  32C  Ankle    3.7 <6.0 13.0 >4 Knee Ankle 7.6 42.0 55 >40  Knee    11.3  10.0          H Reflex Studies   NR H-Lat (ms) Lat Norm (ms) L-R H-Lat (ms)  Left Tibial (Gastroc)  32C     27.07 <35 2.45  Right Tibial (Gastroc)  32C     29.52 <35 2.45   EMG   Side Muscle Ins Act Fibs Psw Fasc Number Recrt Dur Dur. Amp Amp. Poly Poly. Comment  Right AntTibialis Nml Nml Nml Nml Nml Nml Nml Nml Nml Nml Nml Nml N/A  Right Gastroc Nml Nml Nml Nml Nml Nml  Nml Nml Nml Nml Nml Nml N/A  Right Flex Dig Long Nml Nml Nml Nml Nml Nml Nml Nml Nml Nml Nml Nml N/A  Right RectFemoris Nml Nml Nml Nml Nml Nml Nml Nml Nml Nml Nml Nml N/A  Right GluteusMed Nml Nml Nml Nml Nml Nml Nml Nml Nml Nml Nml Nml N/A  Left AntTibialis Nml Nml Nml Nml Nml Nml Nml Nml Nml Nml Nml Nml N/A  Left Gastroc Nml Nml Nml Nml Nml Nml Nml Nml Nml Nml Nml Nml N/A  Left Flex Dig Long Nml Nml Nml Nml Nml Nml Nml Nml Nml Nml Nml Nml N/A  Left RectFemoris Nml Nml Nml Nml Nml Nml Nml Nml Nml Nml Nml Nml N/A  Left GluteusMed Nml Nml Nml Nml Nml Nml Nml Nml Nml Nml Nml Nml N/A      Waveforms:

## 2019-11-09 ENCOUNTER — Other Ambulatory Visit: Payer: Self-pay | Admitting: Family Medicine

## 2019-11-09 MED FILL — TECHLITE PEN NDL 32GX1/4: 32G X 6 MM | 90 days supply | Qty: 100 | Fill #0

## 2019-11-10 ENCOUNTER — Telehealth: Payer: Self-pay

## 2019-11-10 NOTE — Telephone Encounter (Signed)
-----   Message from Drema Dallas, DO sent at 11/08/2019  4:33 PM EDT ----- Nerve study is normal.  No evidence of polyneuropathy, pinched nerve from the back or muscle disease.  I have no explanation for her pain.  The numbness in the foot may still be a pinched nerve in the back (even if the test did not show this), however there isn't much that can be done for just numbness and tingling.

## 2019-11-10 NOTE — Telephone Encounter (Signed)
Pt advised of her EMG results.

## 2019-11-14 MED FILL — AMITRIPTYLINE HCL 10 MG TAB: 10 | 30 days supply | Qty: 60 | Fill #3

## 2019-11-28 MED FILL — REPATHA SURECLICK 140 MG/ML: 140 | 28 days supply | Qty: 2 | Fill #2

## 2019-12-19 MED FILL — ESTRADIOL 0.1 MG/GM CREA: 0.1 | 90 days supply | Qty: 43 | Fill #1

## 2019-12-19 MED FILL — METOPROLOL SUCCINATE ER 50: 50 | 30 days supply | Qty: 75 | Fill #1

## 2019-12-26 MED FILL — REPATHA SURECLICK 140 MG/ML: 140 | 28 days supply | Qty: 2 | Fill #3

## 2020-01-09 ENCOUNTER — Ambulatory Visit (INDEPENDENT_AMBULATORY_CARE_PROVIDER_SITE_OTHER): Payer: BLUE CROSS/BLUE SHIELD | Admitting: Family Medicine

## 2020-01-09 ENCOUNTER — Encounter: Payer: Self-pay | Admitting: Family Medicine

## 2020-01-09 ENCOUNTER — Other Ambulatory Visit: Payer: Self-pay | Admitting: Family Medicine

## 2020-01-09 ENCOUNTER — Other Ambulatory Visit: Payer: Self-pay

## 2020-01-09 DIAGNOSIS — E1159 Type 2 diabetes mellitus with other circulatory complications: Secondary | ICD-10-CM

## 2020-01-09 DIAGNOSIS — G4700 Insomnia, unspecified: Secondary | ICD-10-CM

## 2020-01-09 DIAGNOSIS — I152 Hypertension secondary to endocrine disorders: Secondary | ICD-10-CM

## 2020-01-09 DIAGNOSIS — Z23 Encounter for immunization: Secondary | ICD-10-CM

## 2020-01-09 DIAGNOSIS — Z0000 Encounter for general adult medical examination without abnormal findings: Secondary | ICD-10-CM

## 2020-01-09 DIAGNOSIS — N289 Disorder of kidney and ureter, unspecified: Secondary | ICD-10-CM | POA: Diagnosis not present

## 2020-01-09 DIAGNOSIS — M069 Rheumatoid arthritis, unspecified: Secondary | ICD-10-CM

## 2020-01-09 DIAGNOSIS — E7800 Pure hypercholesterolemia, unspecified: Secondary | ICD-10-CM | POA: Diagnosis not present

## 2020-01-09 DIAGNOSIS — Z1231 Encounter for screening mammogram for malignant neoplasm of breast: Secondary | ICD-10-CM

## 2020-01-09 DIAGNOSIS — E119 Type 2 diabetes mellitus without complications: Secondary | ICD-10-CM

## 2020-01-09 MED ORDER — AMITRIPTYLINE HCL 10 MG PO TABS: 10.0000 mg | ORAL_TABLET | Freq: Every day | ORAL | 1 refills | Status: DC

## 2020-01-09 MED ORDER — VICTOZA 18 MG/3ML ~~LOC~~ SOPN: PEN_INJECTOR | SUBCUTANEOUS | 5 refills | Status: DC

## 2020-01-09 MED FILL — AMITRIPTYLINE HCL 10 MG TAB: 10 | 90 days supply | Qty: 90 | Fill #0

## 2020-01-09 MED FILL — VICTOZA 2-PAK 18 MG/3 ML PE: 18 | 60 days supply | Qty: 12 | Fill #0

## 2020-01-09 NOTE — Assessment & Plan Note (Signed)
Hydrate and monitor 

## 2020-01-09 NOTE — Patient Instructions (Signed)

## 2020-01-09 NOTE — Assessment & Plan Note (Addendum)
Patient encouraged to maintain heart healthy diet, regular exercise, adequate sleep. Consider daily probiotics. Take medications as prescribed. Labs ordered and reviewed. She declines colonoscopy and cologuard testing. MGM ordered.

## 2020-01-09 NOTE — Assessment & Plan Note (Signed)
Well controlled, no changes to meds. Encouraged heart healthy diet such as the DASH diet and exercise as tolerated.  °

## 2020-01-09 NOTE — Assessment & Plan Note (Signed)
Encouraged good sleep hygiene such as dark, quiet room. No blue/green glowing lights such as computer screens in bedroom. No alcohol or stimulants in evening. Cut down on caffeine as able. Regular exercise is helpful but not just prior to bed time. She was started on Amitriptyline 10 mg qhs by rheumatology and that has been helpful. We will refill today.

## 2020-01-09 NOTE — Assessment & Plan Note (Signed)
Unclear if this diagnosis is correct so she has gotten frustrated and has stopped going to rheumatology and is managing her pain by resting, eating well and staying active she will let us know if pain worsens or changes so we  Can consider new approach or referral

## 2020-01-09 NOTE — Progress Notes (Signed)
Subjective:    Patient ID: Marissa Guzman, female    DOB: [birth year], 54 y.o.   MRN: [id number]  Chief Complaint  Patient presents with  . Annual Exam    HPI Patient is in today for annual preventative exam and follow up on chronic medical concerns. She denies any recent febrile illness or hospitalizations. She has stopped seeing Rheumatology as she did not feel like she was getting a definitive diagnosis or treatment. The only medicine they gave her that was helpful was Elavil which did help her sleep at just 10 mg dosing. She is eating a heart healthy diet and exercising regularly. Denies CP/palp/SOB/HA/congestion/fevers/GI or GU c/o. Taking meds as prescribed  Past Medical History:  Diagnosis Date  . Acute bronchitis 05/16/2012  . Allergic state 05/16/2012  . Allergy   . Constipation 04/16/2014  . Diabetes mellitus without complication ([place])   . DM2 (diabetes mellitus, type 2) ([place]) 08/10/2015  . Fibromyalgia   . GERD (gastroesophageal reflux disease) 06/12/2013  . Hip pain, bilateral 08/19/2015  . HTN (hypertension)   . Hyperglycemia 08/10/2015  . Hyperlipidemia   . Hypokalemia 11/14/2012  . Osteoarthritis   . Overweight 08/14/2016  . Rheumatoid arthritis ([place])   . Seasonal allergies     Past Surgical History:  Procedure Laterality Date  . CESAREAN SECTION    . HEMORRHOID SURGERY     two    Family History  Problem Relation Age of Onset  . Heart disease Father        Deceased  . Hypertension Father   . Lung cancer Father   . Melanoma Father        Eye Removal  . Hyperlipidemia Mother   . Hypertension Mother   . Colon polyps Mother   . Arthritis/Rheumatoid Mother   . Hyperlipidemia Maternal Grandmother   . Diabetes Maternal Grandmother   . Dementia Maternal Grandmother   . Hypertension Sister        x2  . Colon polyps Sister   . Lupus Sister   . Hyperlipidemia Sister        x2  . Colon polyps Sister   . Colon cancer Neg Hx   . Esophageal cancer Neg Hx   .  Rectal cancer Neg Hx   . Stomach cancer Neg Hx     Social History   Socioeconomic History  . Marital status: Married    Spouse name: [place]  . Number of children: 1  . Years of education: 27  . 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 Smoking status: Never Smoker  . Smokeless tobacco: Never Used  Vaping Use  . Vaping Use: Never used  Substance and Sexual Activity  . Alcohol use: No  . Drug use: No  . 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 Not on file  Social History Narrative   Lives with husband   Caffeine- coffee 1 c   Right handed   Social Determinants of Health   Financial Resource Strain:   . Difficulty of Paying Living Expenses: Not on file  Food Insecurity:   . Worried About Charity fundraiser in the Last Year: Not on file  . Ran Out of Food in the Last Year: Not on file  Transportation Needs:   . Lack of Transportation (  Medical): Not on file  . Lack of Transportation (Non-Medical): Not on file  Physical Activity:   . Days of Exercise per Week: Not on file  . Minutes of Exercise per Session: Not on file  Stress:   . Feeling of Stress : Not on file  Social Connections:   . Frequency of Communication with Friends and Family: Not on file  . Frequency of Social Gatherings with Friends and Family: Not on file  . Attends Religious Services: Not on file  . Active Member of Clubs or Organizations: Not on file  . Attends Archivist Meetings: Not on file  . Marital Status: Not on file  Intimate Partner Violence:   . Fear of Current or Ex-Partner: Not on file  . Emotionally Abused: Not on file  . Physically Abused: Not on file  . Sexually Abused: Not on file    Outpatient Medications Prior to Visit  Medication Sig Dispense Refill  . blood glucose meter kit and  supplies KIT Dispense based on patient and insurance preference. Check BS  daily as directed.  DzE11.9 1 each 0  . cetirizine (ZYRTEC) 10 MG tablet Take 10 mg by mouth daily as needed.    . fluticasone (FLONASE) 50 MCG/ACT nasal spray Place 2 sprays into the nose daily as needed.    . metoprolol succinate (TOPROL-XL) 50 MG 24 hr tablet TAKE 1 & 1/2 TABLET BY MOUTH EVERY MORNING AND 1 TABLET IN THE EVENING WITH OR IMMEDIATELY FOLLOWING A MEAL 75 tablet 5  . omeprazole (PRILOSEC) 20 MG capsule Take 20 mg by mouth daily.    . ONE TOUCH ULTRA TEST test strip   0  . ONETOUCH DELICA LANCETS 26J MISC   0  . REPATHA SURECLICK 335 MG/ML SOAJ INJECT 1 DOSE INTO THE SKIN EVERY 14 DAYS 2 mL 4  . TECHLITE PEN NEEDLES 32G X 6 MM MISC USE AS DIRECTED 100 each 1  . triamterene-hydrochlorothiazide (MAXZIDE-25) 37.5-25 MG tablet TAKE 1 TABLET BY MOUTH DAILY. 90 tablet 1  . amitriptyline (ELAVIL) 10 MG tablet 20 mg at bedtime.    Marland Kitchen VICTOZA 18 MG/3ML SOPN INJECT 0.2 MLS (1.2 MG TOTAL) INTO THE SKIN DAILY. 12 mL 3  . folic acid (FOLVITE) 1 MG tablet  (Patient not taking: Reported on 01/09/2020)    . methotrexate 2.5 MG tablet 2.5 mg every 7 (seven) days. (Patient not taking: Reported on 01/09/2020)    . naproxen (NAPROSYN) 500 MG tablet Take 500 mg by mouth 2 (two) times daily.  (Patient not taking: Reported on 01/09/2020)     No facility-administered medications prior to visit.    Allergies  Allergen Reactions  . Fenofibrate Other (See Comments)    myalgias  . Doxycycline Monohydrate     GI Issues.  . Niacin And Related     Extreme flushing despite taking it correctly  . Statins Other (See Comments)    Livalo, crestor myalgias  . Tetracyclines & Related Other (See Comments)    GI Issues.    Review of Systems  Constitutional: Positive for malaise/fatigue. Negative for chills and fever.  HENT: Negative for congestion and hearing loss.   Eyes: Negative for discharge.  Respiratory: Negative for cough,  sputum production and shortness of breath.   Cardiovascular: Negative for chest pain, palpitations and leg swelling.  Gastrointestinal: Negative for abdominal pain, blood in stool, constipation, diarrhea, heartburn, nausea and vomiting.  Genitourinary: Negative for dysuria, frequency, hematuria and urgency.  Musculoskeletal: Positive for back pain,  joint pain and myalgias. Negative for falls.  Skin: Negative for rash.  Neurological: Negative for dizziness, sensory change, loss of consciousness, weakness and headaches.  Endo/Heme/Allergies: Negative for environmental allergies. Does not bruise/bleed easily.  Psychiatric/Behavioral: Negative for depression and suicidal ideas. The patient is not nervous/anxious and does not have insomnia.        Objective:    Physical Exam Constitutional:      General: She is not in acute distress.    Appearance: She is well-developed.  HENT:     Head: Normocephalic and atraumatic.     Right Ear: Tympanic membrane, ear canal and external ear normal. There is no impacted cerumen.     Left Ear: Tympanic membrane, ear canal and external ear normal. There is no impacted cerumen.  Eyes:     General:        Right eye: No discharge.        Left eye: No discharge.     Extraocular Movements: Extraocular movements intact.     Conjunctiva/sclera: Conjunctivae normal.     Pupils: Pupils are equal, round, and reactive to light.  Neck:     Thyroid: No thyromegaly.  Cardiovascular:     Rate and Rhythm: Normal rate and regular rhythm.     Heart sounds: Normal heart sounds. No murmur heard.   Pulmonary:     Effort: Pulmonary effort is normal. No respiratory distress.     Breath sounds: Normal breath sounds. No wheezing.  Abdominal:     General: Bowel sounds are normal. There is no distension.     Palpations: Abdomen is soft. There is no mass.     Tenderness: There is no abdominal tenderness.  Musculoskeletal:     Cervical back: Neck supple.  Lymphadenopathy:       Cervical: No cervical adenopathy.  Skin:    General: Skin is warm and dry.  Neurological:     Mental Status: She is alert and oriented to person, place, and time.  Psychiatric:        Behavior: Behavior normal.     There were no vitals taken for this visit. Wt Readings from Last 3 Encounters:  10/05/19 113 lb 6.4 oz (51.4 kg)  09/12/19 117 lb 8 oz (53.3 kg)  06/28/19 119 lb 3.2 oz (54.1 kg)    Diabetic Foot Exam - Simple   No data filed     Lab Results  Component Value Date   WBC 6.8 01/09/2020   HGB 13.7 01/09/2020   HCT 40.9 01/09/2020   PLT 233 01/09/2020   GLUCOSE 82 01/09/2020   CHOL 165 01/09/2020   TRIG 188 (H) 01/09/2020   HDL 57 01/09/2020   LDLDIRECT 88.0 12/10/2018   LDLCALC 80 01/09/2020   ALT 11 01/09/2020   AST 18 01/09/2020   NA 140 01/09/2020   K 4.1 01/09/2020   CL 101 01/09/2020   CREATININE 0.93 01/09/2020   BUN 21 01/09/2020   CO2 30 01/09/2020   TSH 1.27 06/28/2019   HGBA1C 5.3 05/17/2019    Lab Results  Component Value Date   TSH 1.27 06/28/2019   Lab Results  Component Value Date   WBC 6.8 01/09/2020   HGB 13.7 01/09/2020   HCT 40.9 01/09/2020   MCV 89.7 01/09/2020   PLT 233 01/09/2020   Lab Results  Component Value Date   NA 140 01/09/2020   K 4.1 01/09/2020   CO2 30 01/09/2020   GLUCOSE 82 01/09/2020   BUN 21 01/09/2020  CREATININE 0.93 01/09/2020   BILITOT 0.6 01/09/2020   ALKPHOS 90 09/29/2019   AST 18 01/09/2020   ALT 11 01/09/2020   PROT 7.4 01/09/2020   ALBUMIN 4.7 09/29/2019   CALCIUM 10.3 01/09/2020   GFR 64.41 06/28/2019   Lab Results  Component Value Date   CHOL 165 01/09/2020   Lab Results  Component Value Date   HDL 57 01/09/2020   Lab Results  Component Value Date   LDLCALC 80 01/09/2020   Lab Results  Component Value Date   TRIG 188 (H) 01/09/2020   Lab Results  Component Value Date   CHOLHDL 2.9 01/09/2020   Lab Results  Component Value Date   HGBA1C 5.3 05/17/2019        Assessment & Plan:   Problem List Items Addressed This Visit    Hypercholesterolemia    Tolerating statin, encouraged heart healthy diet, avoid trans fats, minimize simple carbs and saturated fats. Increase exercise as tolerated. Tolerating Repatha       Relevant Orders   Lipid panel (Completed)   Preventative health care    Patient encouraged to maintain heart healthy diet, regular exercise, adequate sleep. Consider daily probiotics. Take medications as prescribed. Labs ordered and reviewed. She declines colonoscopy and cologuard testing. MGM ordered.       DM2 (diabetes mellitus, type 2) ([facility])    hgba1c acceptable, minimize simple carbs. Increase exercise as tolerated. Continue current meds      Relevant Medications   liraglutide (VICTOZA) 18 MG/3ML SOPN   Other Relevant Orders   Hemoglobin A1c   Renal insufficiency    Hydrate and monitor      Rheumatoid arthritis ([place])    Unclear if this diagnosis is correct so she has gotten frustrated and has stopped going to rheumatology and is managing her pain by resting, eating well and staying active she will let us know if pain worsens or changes so we  Can consider new approach or referral      Hypertension associated with diabetes ([place])    Well controlled, no changes to meds. Encouraged heart healthy diet such as the DASH diet and exercise as tolerated.       Relevant Medications   liraglutide (VICTOZA) 18 MG/3ML SOPN   Other Relevant Orders   CBC (Completed)   Comprehensive metabolic panel (Completed)   TSH   Insomnia    Encouraged good sleep hygiene such as dark, quiet room. No blue/green glowing lights such as computer screens in bedroom. No alcohol or stimulants in evening. Cut down on caffeine as able. Regular exercise is helpful but not just prior to bed time. She was started on Amitriptyline 10 mg qhs by rheumatology and that has been helpful. We will refill today.       Other Visit Diagnoses    Encounter for  screening mammogram for malignant neoplasm of breast    -  Primary   Relevant Orders   MM 3D SCREEN BREAST BILATERAL      I have discontinued Josiephine B. Pilgrim's folic acid, methotrexate, and naproxen. I have also changed her Victoza and amitriptyline. Additionally, I am having her maintain her fluticasone, cetirizine, omeprazole, blood glucose meter kit and supplies, ONE TOUCH ULTRA TEST, OneTouch Delica Lancets 05L, triamterene-hydrochlorothiazide, Repatha SureClick, metoprolol succinate, and TechLite Pen Needles.  Meds ordered this encounter  Medications  . liraglutide (VICTOZA) 18 MG/3ML SOPN    Sig: INJECT 0.2 MLS (1.2 MG TOTAL) INTO THE SKIN DAILY.    Dispense:  12  mL    Refill:  5  . amitriptyline (ELAVIL) 10 MG tablet    Sig: Take 1 tablet (10 mg total) by mouth at bedtime.    Dispense:  90 tablet    Refill:  1     Penni Homans, MD

## 2020-01-09 NOTE — Assessment & Plan Note (Signed)
hgba1c acceptable, minimize simple carbs. Increase exercise as tolerated. Continue current meds 

## 2020-01-09 NOTE — Assessment & Plan Note (Signed)
Tolerating statin, encouraged heart healthy diet, avoid trans fats, minimize simple carbs and saturated fats. Increase exercise as tolerated. Tolerating Repatha

## 2020-01-10 LAB — HEMOGLOBIN A1C
Hgb A1c MFr Bld: 5.4 % of total Hgb (ref <5.7)
Mean Plasma Glucose: 108 (calc)
eAG (mmol/L): 6 (calc)

## 2020-01-10 LAB — CBC
HCT: 40.9 % (ref 35.0–45.0)
Hemoglobin: 13.7 g/dL (ref 11.7–15.5)
MCH: 30 pg (ref 27.0–33.0)
MCHC: 33.5 g/dL (ref 32.0–36.0)
MCV: 89.7 fL (ref 80.0–100.0)
MPV: 11.6 fL (ref 7.5–12.5)
Platelets: 233 10*3/uL (ref 140–400)
RBC: 4.56 10*6/uL (ref 3.80–5.10)
RDW: 11.8 % (ref 11.0–15.0)
WBC: 6.8 10*3/uL (ref 3.8–10.8)

## 2020-01-10 LAB — LIPID PANEL
Cholesterol: 165 mg/dL (ref <200)
HDL: 57 mg/dL (ref >=50)
LDL Cholesterol (Calc): 80 mg/dL (calc)
Non-HDL Cholesterol (Calc): 108 mg/dL (calc) (ref <130)
Total CHOL/HDL Ratio: 2.9 (calc) (ref <5.0)
Triglycerides: 188 mg/dL — ABNORMAL HIGH (ref <150)

## 2020-01-10 LAB — COMPREHENSIVE METABOLIC PANEL
AG Ratio: 1.6 (calc) (ref 1.0–2.5)
ALT: 11 U/L (ref 6–29)
AST: 18 U/L (ref 10–35)
Albumin: 4.5 g/dL (ref 3.6–5.1)
Alkaline phosphatase (APISO): 107 U/L (ref 37–153)
BUN: 21 mg/dL (ref 7–25)
CO2: 30 mmol/L (ref 20–32)
Calcium: 10.3 mg/dL (ref 8.6–10.4)
Chloride: 101 mmol/L (ref 98–110)
Creat: 0.93 mg/dL (ref 0.50–1.05)
Globulin: 2.9 g/dL (calc) (ref 1.9–3.7)
Glucose, Bld: 82 mg/dL (ref 65–99)
Potassium: 4.1 mmol/L (ref 3.5–5.3)
Sodium: 140 mmol/L (ref 135–146)
Total Bilirubin: 0.6 mg/dL (ref 0.2–1.2)
Total Protein: 7.4 g/dL (ref 6.1–8.1)

## 2020-01-10 LAB — TSH: TSH: 1.87 mIU/L

## 2020-01-17 ENCOUNTER — Other Ambulatory Visit: Payer: Self-pay | Admitting: Family Medicine

## 2020-01-17 MED FILL — METOPROLOL SUCCINATE ER 50: 50 | 30 days supply | Qty: 75 | Fill #2

## 2020-01-17 MED FILL — TRIAMTERENE-HCTZ 37.5-25 MG: 37.5-25 | 90 days supply | Qty: 90 | Fill #0

## 2020-01-30 MED FILL — REPATHA SURECLICK 140 MG/ML: 140 | 28 days supply | Qty: 2 | Fill #4

## 2020-02-14 ENCOUNTER — Ambulatory Visit: Payer: BLUE CROSS/BLUE SHIELD | Attending: Internal Medicine

## 2020-02-14 ENCOUNTER — Other Ambulatory Visit (HOSPITAL_BASED_OUTPATIENT_CLINIC_OR_DEPARTMENT_OTHER): Payer: Self-pay | Admitting: Internal Medicine

## 2020-02-14 DIAGNOSIS — Z23 Encounter for immunization: Secondary | ICD-10-CM

## 2020-02-14 MED FILL — MODERNA COVID-19 VACCINE 10: 100 | 1 days supply | Qty: 0 | Fill #0

## 2020-02-14 NOTE — Progress Notes (Signed)
   Covid-19 Vaccination Clinic  Name:  Marissa Guzman    MRN: [id number] DOB: [birth year]  02/14/2020  Marissa Guzman was observed post Covid-19 immunization for 15 minutes without incident. She was provided with Vaccine Information Sheet and instruction to access the V-Safe system.   Marissa Guzman was instructed to call 911 with any severe reactions post vaccine: Marland Kitchen Difficulty breathing  . Swelling of face and throat  . A fast heartbeat  . A bad rash all over body  . Dizziness and weakness   Immunizations Administered    No immunizations on file.

## 2020-02-22 ENCOUNTER — Telehealth: Payer: Self-pay | Admitting: Internal Medicine

## 2020-02-22 ENCOUNTER — Other Ambulatory Visit: Payer: Self-pay | Admitting: Internal Medicine

## 2020-02-22 NOTE — Telephone Encounter (Signed)
PA for repatha submitted via CMM (Key: [id number])

## 2020-02-23 MED FILL — TECHLITE PEN NDL 32GX1/4: 32G X 6 MM | 90 days supply | Qty: 100 | Fill #1

## 2020-02-24 MED FILL — REPATHA SURECLICK 140 MG/ML: 140 | 30 days supply | Qty: 2 | Fill #0

## 2020-02-27 NOTE — Telephone Encounter (Signed)
Received fax from [facility] Patient is approved for Repatha until 09/06/2020 Approved NDCs: [address]-09, [id number], [id number], [id number] Max quantity 2 in 25 days, with last fill on 01/30/2020

## 2020-03-02 MED FILL — METOPROLOL SUCCINATE ER 50: 50 | 30 days supply | Qty: 75 | Fill #3

## 2020-03-19 MED FILL — VICTOZA 2-PAK 18 MG/3 ML PE: 18 | 60 days supply | Qty: 12 | Fill #1

## 2020-03-21 MED FILL — REPATHA SURECLICK 140 MG/ML: 140 | 30 days supply | Qty: 2 | Fill #1

## 2020-03-29 ENCOUNTER — Telehealth: Payer: Self-pay

## 2020-03-29 NOTE — Telephone Encounter (Signed)
PA approved. Effective from 03/29/2020 through 03/28/2021.

## 2020-03-29 NOTE — Telephone Encounter (Signed)
PA initiated via Covermymeds; KEY: [id number]. Awaiting determination.

## 2020-04-09 MED FILL — METOPROLOL SUCCINATE ER 50: 50 | 30 days supply | Qty: 75 | Fill #4

## 2020-04-23 MED FILL — AMITRIPTYLINE HCL 10 MG TAB: 10 | 90 days supply | Qty: 90 | Fill #1

## 2020-04-23 MED FILL — TRIAMTERENE-HCTZ 37.5-25 MG: 37.5-25 | 90 days supply | Qty: 90 | Fill #1

## 2020-05-07 MED FILL — REPATHA SURECLICK 140 MG/ML: 140 | 30 days supply | Qty: 2 | Fill #2

## 2020-05-21 MED FILL — VICTOZA 2-PAK 18 MG/3 ML PE: 18 | 60 days supply | Qty: 12 | Fill #2

## 2020-05-21 MED FILL — METOPROLOL SUCCINATE ER 50: 50 | 30 days supply | Qty: 75 | Fill #5

## 2020-05-24 DIAGNOSIS — L7211 Pilar cyst: Secondary | ICD-10-CM | POA: Diagnosis not present

## 2020-05-30 DIAGNOSIS — H40013 Open angle with borderline findings, low risk, bilateral: Secondary | ICD-10-CM | POA: Diagnosis not present

## 2020-05-30 DIAGNOSIS — E119 Type 2 diabetes mellitus without complications: Secondary | ICD-10-CM | POA: Diagnosis not present

## 2020-05-30 DIAGNOSIS — H2513 Age-related nuclear cataract, bilateral: Secondary | ICD-10-CM | POA: Diagnosis not present

## 2020-05-30 DIAGNOSIS — H18513 Endothelial corneal dystrophy, bilateral: Secondary | ICD-10-CM | POA: Diagnosis not present

## 2020-05-30 LAB — HM DIABETES EYE EXAM

## 2020-06-04 MED FILL — REPATHA SURECLICK 140 MG/ML: 140 | 30 days supply | Qty: 2 | Fill #3

## 2020-06-14 ENCOUNTER — Other Ambulatory Visit (HOSPITAL_BASED_OUTPATIENT_CLINIC_OR_DEPARTMENT_OTHER): Payer: Self-pay | Admitting: Obstetrics and Gynecology

## 2020-06-14 DIAGNOSIS — Z1231 Encounter for screening mammogram for malignant neoplasm of breast: Secondary | ICD-10-CM | POA: Diagnosis not present

## 2020-06-14 DIAGNOSIS — N958 Other specified menopausal and perimenopausal disorders: Secondary | ICD-10-CM | POA: Diagnosis not present

## 2020-06-14 DIAGNOSIS — Z01419 Encounter for gynecological examination (general) (routine) without abnormal findings: Secondary | ICD-10-CM | POA: Diagnosis not present

## 2020-06-16 ENCOUNTER — Other Ambulatory Visit (HOSPITAL_BASED_OUTPATIENT_CLINIC_OR_DEPARTMENT_OTHER): Payer: Self-pay

## 2020-06-19 ENCOUNTER — Other Ambulatory Visit: Payer: Self-pay | Admitting: Family Medicine

## 2020-06-19 ENCOUNTER — Other Ambulatory Visit (HOSPITAL_BASED_OUTPATIENT_CLINIC_OR_DEPARTMENT_OTHER): Payer: Self-pay

## 2020-06-19 MED ORDER — TECHLITE PEN NEEDLES 32G X 6 MM MISC
1 refills | Status: DC
  Filled 2020-06-19: qty 100, 90d supply, fill #0
  Filled 2020-10-07: qty 100, 90d supply, fill #1

## 2020-06-22 ENCOUNTER — Other Ambulatory Visit (HOSPITAL_BASED_OUTPATIENT_CLINIC_OR_DEPARTMENT_OTHER): Payer: Self-pay

## 2020-06-22 MED ORDER — ESTRADIOL 10 MCG VA TABS
ORAL_TABLET | VAGINAL | 5 refills | Status: DC
  Filled 2020-06-22: qty 24, 90d supply, fill #0
  Filled 2020-08-17 – 2020-10-30 (×3): qty 24, 90d supply, fill #1

## 2020-06-25 ENCOUNTER — Other Ambulatory Visit (HOSPITAL_BASED_OUTPATIENT_CLINIC_OR_DEPARTMENT_OTHER): Payer: Self-pay

## 2020-06-25 ENCOUNTER — Other Ambulatory Visit: Payer: Self-pay | Admitting: Family Medicine

## 2020-06-25 DIAGNOSIS — I10 Essential (primary) hypertension: Secondary | ICD-10-CM

## 2020-06-25 MED ORDER — METOPROLOL SUCCINATE ER 50 MG PO TB24
ORAL_TABLET | ORAL | 1 refills | Status: DC
  Filled 2020-06-25: qty 225, 90d supply, fill #0
  Filled 2020-10-29: qty 225, 90d supply, fill #1

## 2020-06-26 ENCOUNTER — Other Ambulatory Visit (HOSPITAL_BASED_OUTPATIENT_CLINIC_OR_DEPARTMENT_OTHER): Payer: Self-pay

## 2020-06-27 ENCOUNTER — Other Ambulatory Visit (HOSPITAL_BASED_OUTPATIENT_CLINIC_OR_DEPARTMENT_OTHER): Payer: Self-pay

## 2020-07-09 ENCOUNTER — Encounter: Payer: Self-pay | Admitting: Family Medicine

## 2020-07-09 ENCOUNTER — Other Ambulatory Visit: Payer: Self-pay

## 2020-07-09 ENCOUNTER — Ambulatory Visit: Payer: BC Managed Care – PPO | Admitting: Family Medicine

## 2020-07-09 VITALS — BP 118/76 | HR 71 | Temp 98.1°F | Resp 16 | Wt 126.8 lb

## 2020-07-09 DIAGNOSIS — Z1211 Encounter for screening for malignant neoplasm of colon: Secondary | ICD-10-CM

## 2020-07-09 DIAGNOSIS — I10 Essential (primary) hypertension: Secondary | ICD-10-CM | POA: Diagnosis not present

## 2020-07-09 DIAGNOSIS — Z23 Encounter for immunization: Secondary | ICD-10-CM | POA: Diagnosis not present

## 2020-07-09 DIAGNOSIS — E119 Type 2 diabetes mellitus without complications: Secondary | ICD-10-CM | POA: Diagnosis not present

## 2020-07-09 DIAGNOSIS — N289 Disorder of kidney and ureter, unspecified: Secondary | ICD-10-CM

## 2020-07-09 DIAGNOSIS — E7800 Pure hypercholesterolemia, unspecified: Secondary | ICD-10-CM

## 2020-07-09 DIAGNOSIS — Z1231 Encounter for screening mammogram for malignant neoplasm of breast: Secondary | ICD-10-CM

## 2020-07-09 LAB — CBC
HCT: 40.3 % (ref 36.0–46.0)
Hemoglobin: 13.7 g/dL (ref 12.0–15.0)
MCHC: 34.1 g/dL (ref 30.0–36.0)
MCV: 86.4 fl (ref 78.0–100.0)
Platelets: 202 10*3/uL (ref 150.0–400.0)
RBC: 4.66 Mil/uL (ref 3.87–5.11)
RDW: 13.3 % (ref 11.5–15.5)
WBC: 6.2 10*3/uL (ref 4.0–10.5)

## 2020-07-09 LAB — COMPREHENSIVE METABOLIC PANEL
ALT: 11 U/L (ref 0–35)
AST: 18 U/L (ref 0–37)
Albumin: 4.2 g/dL (ref 3.5–5.2)
Alkaline Phosphatase: 102 U/L (ref 39–117)
BUN: 18 mg/dL (ref 6–23)
CO2: 30 mEq/L (ref 19–32)
Calcium: 9.7 mg/dL (ref 8.4–10.5)
Chloride: 101 mEq/L (ref 96–112)
Creatinine, Ser: 0.89 mg/dL (ref 0.40–1.20)
GFR: 73.15 mL/min (ref >60.00)
Glucose, Bld: 87 mg/dL (ref 70–99)
Potassium: 3.2 mEq/L — ABNORMAL LOW (ref 3.5–5.1)
Sodium: 139 mEq/L (ref 135–145)
Total Bilirubin: 1 mg/dL (ref 0.2–1.2)
Total Protein: 7.3 g/dL (ref 6.0–8.3)

## 2020-07-09 LAB — HEMOGLOBIN A1C: Hgb A1c MFr Bld: 5.8 % (ref 4.6–6.5)

## 2020-07-09 LAB — LIPID PANEL
Cholesterol: 174 mg/dL (ref 0–200)
HDL: 52.7 mg/dL (ref >39.00)
LDL Cholesterol: 89 mg/dL (ref 0–99)
NonHDL: 121.11
Total CHOL/HDL Ratio: 3
Triglycerides: 159 mg/dL — ABNORMAL HIGH (ref 0.0–149.0)
VLDL: 31.8 mg/dL (ref 0.0–40.0)

## 2020-07-09 LAB — TSH: TSH: 1.56 u[IU]/mL (ref 0.35–4.50)

## 2020-07-09 NOTE — Assessment & Plan Note (Addendum)
MGM ordered referred to GI for screening colonoscopy, PCV 20 given today

## 2020-07-09 NOTE — Patient Instructions (Signed)
High Cholesterol  High cholesterol is a condition in which the blood has high levels of a white, waxy substance similar to fat (cholesterol). The liver makes all the cholesterol that the body needs. The human body needs small amounts of cholesterol to help build cells. A person gets extra or excess cholesterol from the food that he or she eats. The blood carries cholesterol from the liver to the rest of the body. If you have high cholesterol, deposits (plaques) may build up on the walls of your arteries. Arteries are the blood vessels that carry blood away from your heart. These plaques make the arteries narrow and stiff. Cholesterol plaques increase your risk for heart attack and stroke. Work with your health care provider to keep your cholesterol levels in a healthy range. What increases the risk? The following factors may make you more likely to develop this condition:  Eating foods that are high in animal fat (saturated fat) or cholesterol.  Being overweight.  Not getting enough exercise.  A family history of high cholesterol (familial hypercholesterolemia).  Use of tobacco products.  Having diabetes. What are the signs or symptoms? There are no symptoms of this condition. How is this diagnosed? This condition may be diagnosed based on the results of a blood test.  If you are older than 55 years of age, your health care provider may check your cholesterol levels every 4-6 years.  You may be checked more often if you have high cholesterol or other risk factors for heart disease. The blood test for cholesterol measures:  "Bad" cholesterol, or LDL cholesterol. This is the main type of cholesterol that causes heart disease. The desired level is less than 100 mg/dL.  "Good" cholesterol, or HDL cholesterol. HDL helps protect against heart disease by cleaning the arteries and carrying the LDL to the liver for processing. The desired level for HDL is 60 mg/dL or higher.  Triglycerides.  These are fats that your body can store or burn for energy. The desired level is less than 150 mg/dL.  Total cholesterol. This measures the total amount of cholesterol in your blood and includes LDL, HDL, and triglycerides. The desired level is less than 200 mg/dL. How is this treated? This condition may be treated with:  Diet changes. You may be asked to eat foods that have more fiber and less saturated fats or added sugar.  Lifestyle changes. These may include regular exercise, maintaining a healthy weight, and quitting use of tobacco products.  Medicines. These are given when diet and lifestyle changes have not worked. You may be prescribed a statin medicine to help lower your cholesterol levels. Follow these instructions at home: Eating and drinking  Eat a healthy, balanced diet. This diet includes: ? Daily servings of a variety of fresh, frozen, or canned fruits and vegetables. ? Daily servings of whole grain foods that are rich in fiber. ? Foods that are low in saturated fats and trans fats. These include poultry and fish without skin, lean cuts of meat, and low-fat dairy products. ? A variety of fish, especially oily fish that contain omega-3 fatty acids. Aim to eat fish at least 2 times a week.  Avoid foods and drinks that have added sugar.  Use healthy cooking methods, such as roasting, grilling, broiling, baking, poaching, steaming, and stir-frying. Do not fry your food except for stir-frying.   Lifestyle  Get regular exercise. Aim to exercise for a total of 150 minutes a week. Increase your activity level by doing activities   such as gardening, walking, and taking the stairs.  Do not use any products that contain nicotine or tobacco, such as cigarettes, e-cigarettes, and chewing tobacco. If you need help quitting, ask your health care provider.   General instructions  Take over-the-counter and prescription medicines only as told by your health care provider.  Keep all  follow-up visits as told by your health care provider. This is important. Where to find more information  American Heart Association: www.heart.org  National Heart, Lung, and Blood Institute: www.nhlbi.nih.gov Contact a health care provider if:  You have trouble achieving or maintaining a healthy diet or weight.  You are starting an exercise program.  You are unable to stop smoking. Get help right away if:  You have chest pain.  You have trouble breathing.  You have any symptoms of a stroke. "BE FAST" is an easy way to remember the main warning signs of a stroke: ? B - Balance. Signs are dizziness, sudden trouble walking, or loss of balance. ? E - Eyes. Signs are trouble seeing or a sudden change in vision. ? F - Face. Signs are sudden weakness or numbness of the face, or the face or eyelid drooping on one side. ? A - Arms. Signs are weakness or numbness in an arm. This happens suddenly and usually on one side of the body. ? S - Speech. Signs are sudden trouble speaking, slurred speech, or trouble understanding what people say. ? T - Time. Time to call emergency services. Write down what time symptoms started.  You have other signs of a stroke, such as: ? A sudden, severe headache with no known cause. ? Nausea or vomiting. ? Seizure. These symptoms may represent a serious problem that is an emergency. Do not wait to see if the symptoms will go away. Get medical help right away. Call your local emergency services (911 in the [place] Do not drive yourself to the hospital. Summary  Cholesterol plaques increase your risk for heart attack and stroke. Work with your health care provider to keep your cholesterol levels in a healthy range.  Eat a healthy, balanced diet, get regular exercise, and maintain a healthy weight.  Do not use any products that contain nicotine or tobacco, such as cigarettes, e-cigarettes, and chewing tobacco.  Get help right away if you have any symptoms of a  stroke. This information is not intended to replace advice given to you by your health care provider. Make sure you discuss any questions you have with your health care provider. Document Revised: 01/31/2019 Document Reviewed: 01/31/2019 Elsevier Patient Education  2021 [employer]  

## 2020-07-09 NOTE — Assessment & Plan Note (Signed)
hgba1c acceptable, minimize simple carbs. Increase exercise as tolerated. Continue current meds 

## 2020-07-09 NOTE — Progress Notes (Signed)
Subjective:    Patient ID: Marissa Guzman, female    DOB: 06-07-1965, 55 y.o.   MRN: [id number]  Chief Complaint  Patient presents with  . Follow-up  . Hyperlipidemia  . Diabetes    Pt has no concerns or problems    HPI Patient is in today for follow up on chronic medical concerns. She feels well. No recent febrile illness or hospitalizations. No polyuria or polydipsia. No acute concerns. She is staying active and eating a heart healthy diet. Denies CP/palp/SOB/HA/congestion/fevers/GI or GU c/o. Taking meds as prescribed  Past Medical History:  Diagnosis Date  . Acute bronchitis 05/16/2012  . Allergic state 05/16/2012  . Allergy   . Constipation 04/16/2014  . Diabetes mellitus without complication ([place])   . DM2 (diabetes mellitus, type 2) ([place]) 08/10/2015  . Fibromyalgia   . GERD (gastroesophageal reflux disease) 06/12/2013  . Hip pain, bilateral 08/19/2015  . HTN (hypertension)   . Hyperglycemia 08/10/2015  . Hyperlipidemia   . Hypokalemia 11/14/2012  . Osteoarthritis   . Overweight 08/14/2016  . Rheumatoid arthritis ([place])   . Seasonal allergies     Past Surgical History:  Procedure Laterality Date  . CESAREAN SECTION    . HEMORRHOID SURGERY     two    Family History  Problem Relation Age of Onset  . Heart disease Father        Deceased  . Hypertension Father   . Lung cancer Father   . Melanoma Father        Eye Removal  . Hyperlipidemia Mother   . Hypertension Mother   . Colon polyps Mother   . Arthritis/Rheumatoid Mother   . Hyperlipidemia Maternal Grandmother   . Diabetes Maternal Grandmother   . Dementia Maternal Grandmother   . Hypertension Sister        x2  . Colon polyps Sister   . Lupus Sister   . Hyperlipidemia Sister        x2  . Colon polyps Sister   . Colon cancer Neg Hx   . Esophageal cancer Neg Hx   . Rectal cancer Neg Hx   . Stomach cancer Neg Hx     Social History   Socioeconomic History  . Marital status: Married    Spouse name:  [place]  . Number of children: 1  . Years of education: 77  . 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 Smoking status: Never Smoker  . Smokeless tobacco: Never Used  Vaping Use  . Vaping Use: Never used  Substance and Sexual Activity  . Alcohol use: No  . Drug use: No  . 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 Not on file  Social History Narrative   Lives with husband   Caffeine- coffee 1 c   Right handed   Social Determinants of Health   Financial Resource Strain: Not on file  Food Insecurity: Not on file  Transportation Needs: Not on file  Physical Activity: Not on file  Stress: Not on file  Social Connections: Not on file  Intimate Partner Violence: Not on file    Outpatient Medications Prior to Visit  Medication Sig Dispense Refill  . blood glucose meter kit and supplies KIT Dispense based on patient and insurance preference. Check BS  daily as directed.  DzE11.9 1 each 0  . cetirizine (ZYRTEC) 10 MG tablet Take 10 mg by mouth daily as needed.    . Estradiol 10 MCG TABS vaginal tablet INSERT 1 TABLET VAGINALLY TWICE WEEKLY AT BEDTIME 24 tablet 5  . Estradiol 10 MCG TABS vaginal tablet PLACE 1 TABLET VAGINALLY NIGHTLY AT BEDTIME X2 WEEKS 24 tablet 0  . Estradiol 10 MCG TABS vaginal tablet Insert 1 tablet vaginally twice weekly at bedtime 24 tablet 5  . Evolocumab 140 MG/ML SOAJ Inject into the skin.    . fluticasone (FLONASE) 50 MCG/ACT nasal spray Place 2 sprays into the nose daily as needed.    . Insulin Pen Needle (TECHLITE PEN NEEDLES) 32G X 6 MM MISC USE AS DIRECTED 100 each 1  . liraglutide (VICTOZA) 18 MG/3ML SOPN INJECT 0.2 MLS (1.2 MG TOTAL) INTO THE SKIN DAILY. 12 mL 5  . metoprolol succinate (TOPROL-XL) 50 MG 24 hr tablet Take 1 & 1/2 tablets (75 mg total) by mouth in the  morning AND 1 tablet (50 mg total) every evening with or immediately following a meal. 225 tablet 1  . omeprazole (PRILOSEC) 20 MG capsule Take 20 mg by mouth daily.    . ONE TOUCH ULTRA TEST test strip   0  . ONETOUCH DELICA LANCETS 59D MISC   0  . triamterene-hydrochlorothiazide (MAXZIDE-25) 37.5-25 MG tablet TAKE 1 TABLET BY MOUTH DAILY. 90 tablet 1  . amitriptyline (ELAVIL) 10 MG tablet TAKE 1 TABLET (10 MG TOTAL) BY MOUTH AT BEDTIME. 90 tablet 1  . Evolocumab 140 MG/ML SOAJ INJECT 1 DOSE INTO THE SKIN EVERY 14 DAYS 2 mL 4   No facility-administered medications prior to visit.    Allergies  Allergen Reactions  . Fenofibrate Other (See Comments)    myalgias  . Doxycycline Monohydrate     GI Issues.  . Niacin And Related     Extreme flushing despite taking it correctly  . Statins Other (See Comments)    Livalo, crestor myalgias  . Tetracyclines & Related Other (See Comments)    GI Issues.    Review of Systems  Constitutional: Negative for fever and malaise/fatigue.  HENT: Negative for congestion.   Eyes: Negative for blurred vision.  Respiratory: Negative for shortness of breath.   Cardiovascular: Negative for chest pain, palpitations and leg swelling.  Gastrointestinal: Negative for abdominal pain, blood in stool and nausea.  Genitourinary: Negative for dysuria and frequency.  Musculoskeletal: Negative for falls.  Skin: Negative for rash.  Neurological: Negative for dizziness, loss of consciousness and headaches.  Endo/Heme/Allergies: Negative for environmental allergies.  Psychiatric/Behavioral: Negative for depression. The patient is not nervous/anxious.        Objective:    Physical Exam Vitals and nursing note reviewed.  Constitutional:      General: She is not in acute distress.    Appearance: She is well-developed.  HENT:     Head: Normocephalic and atraumatic.     Nose: Nose normal.  Eyes:     General:        Right eye: No discharge.        Left eye:  No discharge.  Cardiovascular:     Rate and Rhythm: Normal rate and regular rhythm.     Heart sounds: No murmur heard.   Pulmonary:     Effort: Pulmonary effort is normal.     Breath sounds: Normal breath sounds.  Abdominal:     General: Bowel sounds are normal.     Palpations: Abdomen is  soft.     Tenderness: There is no abdominal tenderness.  Musculoskeletal:     Cervical back: Normal range of motion and neck supple.  Skin:    General: Skin is warm and dry.  Neurological:     Mental Status: She is alert and oriented to person, place, and time.     BP 118/76   Pulse 71   Temp 98.1 F (36.7 C)   Resp 16   Wt 126 lb 12.8 oz (57.5 kg)   SpO2 98%   BMI 22.11 kg/m  Wt Readings from Last 3 Encounters:  07/09/20 126 lb 12.8 oz (57.5 kg)  10/05/19 113 lb 6.4 oz (51.4 kg)  09/12/19 117 lb 8 oz (53.3 kg)    Diabetic Foot Exam - Simple   No data filed    Lab Results  Component Value Date   WBC 6.8 01/09/2020   HGB 13.7 01/09/2020   HCT 40.9 01/09/2020   PLT 233 01/09/2020   GLUCOSE 82 01/09/2020   CHOL 165 01/09/2020   TRIG 188 (H) 01/09/2020   HDL 57 01/09/2020   LDLDIRECT 88.0 12/10/2018   LDLCALC 80 01/09/2020   ALT 11 01/09/2020   AST 18 01/09/2020   NA 140 01/09/2020   K 4.1 01/09/2020   CL 101 01/09/2020   CREATININE 0.93 01/09/2020   BUN 21 01/09/2020   CO2 30 01/09/2020   TSH 1.87 01/09/2020   HGBA1C 5.4 01/09/2020    Lab Results  Component Value Date   TSH 1.87 01/09/2020   Lab Results  Component Value Date   WBC 6.8 01/09/2020   HGB 13.7 01/09/2020   HCT 40.9 01/09/2020   MCV 89.7 01/09/2020   PLT 233 01/09/2020   Lab Results  Component Value Date   NA 140 01/09/2020   K 4.1 01/09/2020   CO2 30 01/09/2020   GLUCOSE 82 01/09/2020   BUN 21 01/09/2020   CREATININE 0.93 01/09/2020   BILITOT 0.6 01/09/2020   ALKPHOS 90 09/29/2019   AST 18 01/09/2020   ALT 11 01/09/2020   PROT 7.4 01/09/2020   ALBUMIN 4.7 09/29/2019   CALCIUM 10.3  01/09/2020   GFR 64.41 06/28/2019   Lab Results  Component Value Date   CHOL 165 01/09/2020   Lab Results  Component Value Date   HDL 57 01/09/2020   Lab Results  Component Value Date   LDLCALC 80 01/09/2020   Lab Results  Component Value Date   TRIG 188 (H) 01/09/2020   Lab Results  Component Value Date   CHOLHDL 2.9 01/09/2020   Lab Results  Component Value Date   HGBA1C 5.4 01/09/2020       Assessment & Plan:   Problem List Items Addressed This Visit    Hypertension, benign essential, goal below 140/90    Well controlled, no changes to meds. Encouraged heart healthy diet such as the DASH diet and exercise as tolerated.       Relevant Medications   Evolocumab 140 MG/ML SOAJ   Other Relevant Orders   CBC   Comprehensive metabolic panel   TSH   Hypercholesterolemia    Encouraged heart healthy diet, increase exercise, avoid trans fats, consider a krill oil cap daily      Relevant Medications   Evolocumab 140 MG/ML SOAJ   Other Relevant Orders   Lipid panel   Colon cancer screening - Primary    MGM ordered referred to GI for screening colonoscopy, PCV 20 given today  Relevant Orders   Ambulatory referral to Gastroenterology   DM2 (diabetes mellitus, type 2) ([place])    hgba1c acceptable, minimize simple carbs. Increase exercise as tolerated. Continue current meds      Relevant Orders   Microalbumin / creatinine urine ratio   Hemoglobin A1c   Renal insufficiency    Hydrate and monitor       Other Visit Diagnoses    Need for pneumococcal vaccination       Relevant Orders   Pneumococcal conjugate vaccine 20-valent (Completed)      I have discontinued Halynn B. Saxe's amitriptyline. I am also having her maintain her fluticasone, cetirizine, omeprazole, blood glucose meter kit and supplies, ONE TOUCH ULTRA TEST, OneTouch Delica Lancets 73X, Estradiol, Estradiol, triamterene-hydrochlorothiazide, liraglutide, TechLite Pen Needles, Estradiol,  metoprolol succinate, and Evolocumab.  No orders of the defined types were placed in this encounter.    Penni Homans, MD

## 2020-07-09 NOTE — Assessment & Plan Note (Signed)
Encouraged heart healthy diet, increase exercise, avoid trans fats, consider a krill oil cap daily 

## 2020-07-09 NOTE — Assessment & Plan Note (Signed)
Well controlled, no changes to meds. Encouraged heart healthy diet such as the DASH diet and exercise as tolerated.  °

## 2020-07-09 NOTE — Assessment & Plan Note (Signed)
Hydrate and monitor 

## 2020-07-10 LAB — MICROALBUMIN / CREATININE URINE RATIO
Creatinine,U: 27.7 mg/dL
Microalb Creat Ratio: 2.5 mg/g (ref 0.0–30.0)
Microalb, Ur: <0.7 mg/dL (ref 0.0–1.9)

## 2020-07-11 ENCOUNTER — Other Ambulatory Visit: Payer: Self-pay

## 2020-07-11 ENCOUNTER — Ambulatory Visit (HOSPITAL_BASED_OUTPATIENT_CLINIC_OR_DEPARTMENT_OTHER)
Admission: RE | Admit: 2020-07-11 | Discharge: 2020-07-11 | Disposition: A | Discharge status: Home / Self Care | Payer: BC Managed Care – PPO | Source: Ambulatory Visit | Attending: Family Medicine | Admitting: Family Medicine

## 2020-07-11 DIAGNOSIS — E876 Hypokalemia: Secondary | ICD-10-CM

## 2020-07-11 DIAGNOSIS — Z1231 Encounter for screening mammogram for malignant neoplasm of breast: Secondary | ICD-10-CM | POA: Insufficient documentation

## 2020-07-13 ENCOUNTER — Other Ambulatory Visit: Payer: Self-pay

## 2020-07-13 ENCOUNTER — Other Ambulatory Visit (HOSPITAL_BASED_OUTPATIENT_CLINIC_OR_DEPARTMENT_OTHER): Payer: Self-pay

## 2020-07-13 MED ORDER — POTASSIUM CHLORIDE CRYS ER 20 MEQ PO TBCR
20.0000 meq | EXTENDED_RELEASE_TABLET | Freq: Every day | ORAL | 3 refills | Status: DC
  Filled 2020-07-13: qty 30, 30d supply, fill #0
  Filled 2020-08-15: qty 30, 30d supply, fill #1

## 2020-07-16 ENCOUNTER — Other Ambulatory Visit: Payer: Self-pay | Admitting: Internal Medicine

## 2020-07-16 ENCOUNTER — Telehealth: Payer: Self-pay | Admitting: Family Medicine

## 2020-07-16 ENCOUNTER — Other Ambulatory Visit (HOSPITAL_BASED_OUTPATIENT_CLINIC_OR_DEPARTMENT_OTHER): Payer: Self-pay

## 2020-07-16 NOTE — Telephone Encounter (Signed)
Pt came in office stating she was in the office last wk and stated by mistake her Repatha was removed for her med list. Pt states is still taking this med by recommendation from Dr Rennis Golden, pt would like that med added to her list again. Please advise.

## 2020-07-16 NOTE — Telephone Encounter (Signed)
Looks like it was removed by another cma for pt preference will route to that cma

## 2020-07-16 NOTE — Telephone Encounter (Signed)
Waiting on a response from Dr. Rennis Golden

## 2020-07-17 ENCOUNTER — Other Ambulatory Visit (HOSPITAL_BASED_OUTPATIENT_CLINIC_OR_DEPARTMENT_OTHER): Payer: Self-pay

## 2020-07-17 MED ORDER — REPATHA SURECLICK 140 MG/ML ~~LOC~~ SOAJ
SUBCUTANEOUS | 0 refills | Status: DC
  Filled 2020-07-17: qty 2, 28d supply, fill #0

## 2020-07-17 NOTE — Telephone Encounter (Signed)
Spoke with pharmacy and they will fill it for this once. I sent message to the doctor so they could refill it for her.

## 2020-07-18 ENCOUNTER — Other Ambulatory Visit (INDEPENDENT_AMBULATORY_CARE_PROVIDER_SITE_OTHER): Payer: BC Managed Care – PPO

## 2020-07-18 ENCOUNTER — Other Ambulatory Visit: Payer: Self-pay

## 2020-07-18 DIAGNOSIS — E876 Hypokalemia: Secondary | ICD-10-CM | POA: Diagnosis not present

## 2020-07-18 LAB — COMPREHENSIVE METABOLIC PANEL
ALT: 10 U/L (ref 0–35)
AST: 16 U/L (ref 0–37)
Albumin: 4.2 g/dL (ref 3.5–5.2)
Alkaline Phosphatase: 93 U/L (ref 39–117)
BUN: 22 mg/dL (ref 6–23)
CO2: 28 mEq/L (ref 19–32)
Calcium: 9.7 mg/dL (ref 8.4–10.5)
Chloride: 105 mEq/L (ref 96–112)
Creatinine, Ser: 0.85 mg/dL (ref 0.40–1.20)
GFR: 77.29 mL/min (ref >60.00)
Glucose, Bld: 94 mg/dL (ref 70–99)
Potassium: 3.8 mEq/L (ref 3.5–5.1)
Sodium: 141 mEq/L (ref 135–145)
Total Bilirubin: 0.7 mg/dL (ref 0.2–1.2)
Total Protein: 7 g/dL (ref 6.0–8.3)

## 2020-07-23 ENCOUNTER — Other Ambulatory Visit: Payer: Self-pay | Admitting: Family Medicine

## 2020-07-23 ENCOUNTER — Other Ambulatory Visit (HOSPITAL_BASED_OUTPATIENT_CLINIC_OR_DEPARTMENT_OTHER): Payer: Self-pay

## 2020-07-23 MED ORDER — TRIAMTERENE-HCTZ 37.5-25 MG PO TABS
1.0000 | ORAL_TABLET | Freq: Every day | ORAL | 1 refills | Status: DC
  Filled 2020-07-23: qty 90, 90d supply, fill #0
  Filled 2020-10-29: qty 90, 90d supply, fill #1

## 2020-07-24 ENCOUNTER — Other Ambulatory Visit (HOSPITAL_COMMUNITY): Payer: Self-pay

## 2020-07-25 ENCOUNTER — Other Ambulatory Visit (HOSPITAL_BASED_OUTPATIENT_CLINIC_OR_DEPARTMENT_OTHER): Payer: Self-pay

## 2020-07-25 MED FILL — Liraglutide Soln Pen-injector 18 MG/3ML (6 MG/ML): SUBCUTANEOUS | 60 days supply | Qty: 12 | Fill #0 | Status: AC

## 2020-08-15 ENCOUNTER — Other Ambulatory Visit (HOSPITAL_BASED_OUTPATIENT_CLINIC_OR_DEPARTMENT_OTHER): Payer: Self-pay

## 2020-08-17 ENCOUNTER — Other Ambulatory Visit (HOSPITAL_BASED_OUTPATIENT_CLINIC_OR_DEPARTMENT_OTHER): Payer: Self-pay

## 2020-08-24 ENCOUNTER — Other Ambulatory Visit (HOSPITAL_BASED_OUTPATIENT_CLINIC_OR_DEPARTMENT_OTHER): Payer: Self-pay

## 2020-08-28 ENCOUNTER — Other Ambulatory Visit (HOSPITAL_BASED_OUTPATIENT_CLINIC_OR_DEPARTMENT_OTHER): Payer: Self-pay

## 2020-08-28 ENCOUNTER — Other Ambulatory Visit: Payer: Self-pay | Admitting: Internal Medicine

## 2020-08-28 MED ORDER — REPATHA SURECLICK 140 MG/ML ~~LOC~~ SOAJ
SUBCUTANEOUS | 11 refills | Status: DC
  Filled 2020-08-28: qty 2, 28d supply, fill #0
  Filled 2020-10-07: qty 2, 28d supply, fill #1
  Filled 2020-11-22: qty 2, 28d supply, fill #2
  Filled 2021-01-14: qty 2, 28d supply, fill #3
  Filled 2021-02-24: qty 2, 28d supply, fill #4
  Filled 2021-04-01: qty 2, 28d supply, fill #5
  Filled 2021-04-30: qty 2, 28d supply, fill #6
  Filled 2021-06-10: qty 2, 28d supply, fill #7
  Filled 2021-07-15: qty 2, 28d supply, fill #8
  Filled 2021-08-26: qty 2, 28d supply, fill #9

## 2020-08-29 ENCOUNTER — Other Ambulatory Visit (HOSPITAL_BASED_OUTPATIENT_CLINIC_OR_DEPARTMENT_OTHER): Payer: Self-pay

## 2020-08-29 MED FILL — Estradiol Vaginal Tab 10 MCG: VAGINAL | 14 days supply | Qty: 14 | Fill #0 | Status: CN

## 2020-08-29 MED FILL — Estradiol Vaginal Tab 10 MCG: VAGINAL | 16 days supply | Qty: 16 | Fill #0 | Status: AC

## 2020-08-30 ENCOUNTER — Other Ambulatory Visit (HOSPITAL_BASED_OUTPATIENT_CLINIC_OR_DEPARTMENT_OTHER): Payer: Self-pay

## 2020-09-03 ENCOUNTER — Telehealth: Payer: Self-pay | Admitting: Internal Medicine

## 2020-09-03 NOTE — Telephone Encounter (Signed)
PA for Repatha [employer] submitted via CMM (Key: [id number])

## 2020-09-12 ENCOUNTER — Other Ambulatory Visit (HOSPITAL_BASED_OUTPATIENT_CLINIC_OR_DEPARTMENT_OTHER): Payer: Self-pay

## 2020-10-03 ENCOUNTER — Ambulatory Visit: Payer: BC Managed Care – PPO

## 2020-10-07 MED FILL — Liraglutide Soln Pen-injector 18 MG/3ML (6 MG/ML): SUBCUTANEOUS | 60 days supply | Qty: 12 | Fill #1 | Status: AC

## 2020-10-08 ENCOUNTER — Other Ambulatory Visit (HOSPITAL_BASED_OUTPATIENT_CLINIC_OR_DEPARTMENT_OTHER): Payer: Self-pay

## 2020-10-29 ENCOUNTER — Other Ambulatory Visit (HOSPITAL_BASED_OUTPATIENT_CLINIC_OR_DEPARTMENT_OTHER): Payer: Self-pay

## 2020-10-29 MED FILL — Estradiol Vaginal Tab 10 MCG: VAGINAL | 16 days supply | Qty: 16 | Fill #1 | Status: CN

## 2020-10-30 ENCOUNTER — Other Ambulatory Visit (HOSPITAL_BASED_OUTPATIENT_CLINIC_OR_DEPARTMENT_OTHER): Payer: Self-pay

## 2020-10-31 ENCOUNTER — Other Ambulatory Visit (HOSPITAL_BASED_OUTPATIENT_CLINIC_OR_DEPARTMENT_OTHER): Payer: Self-pay

## 2020-11-22 ENCOUNTER — Other Ambulatory Visit (HOSPITAL_BASED_OUTPATIENT_CLINIC_OR_DEPARTMENT_OTHER): Payer: Self-pay

## 2020-12-11 ENCOUNTER — Other Ambulatory Visit (HOSPITAL_BASED_OUTPATIENT_CLINIC_OR_DEPARTMENT_OTHER): Payer: Self-pay

## 2020-12-11 MED FILL — Liraglutide Soln Pen-injector 18 MG/3ML (6 MG/ML): SUBCUTANEOUS | 60 days supply | Qty: 12 | Fill #2 | Status: AC

## 2020-12-14 ENCOUNTER — Other Ambulatory Visit (HOSPITAL_BASED_OUTPATIENT_CLINIC_OR_DEPARTMENT_OTHER): Payer: Self-pay

## 2021-01-14 ENCOUNTER — Other Ambulatory Visit (HOSPITAL_BASED_OUTPATIENT_CLINIC_OR_DEPARTMENT_OTHER): Payer: Self-pay

## 2021-01-15 LAB — HM DIABETES EYE EXAM

## 2021-01-28 ENCOUNTER — Other Ambulatory Visit (HOSPITAL_BASED_OUTPATIENT_CLINIC_OR_DEPARTMENT_OTHER): Payer: Self-pay

## 2021-01-28 ENCOUNTER — Other Ambulatory Visit: Payer: Self-pay | Admitting: Family Medicine

## 2021-01-28 DIAGNOSIS — I10 Essential (primary) hypertension: Secondary | ICD-10-CM

## 2021-01-28 MED ORDER — TECHLITE PEN NEEDLES 32G X 6 MM MISC
1 refills | Status: AC
  Filled 2021-01-28: qty 100, 90d supply, fill #0
  Filled 2021-05-17: qty 100, 90d supply, fill #1

## 2021-01-28 MED ORDER — TRIAMTERENE-HCTZ 37.5-25 MG PO TABS
1.0000 | ORAL_TABLET | Freq: Every day | ORAL | 1 refills | Status: DC
Start: 2021-01-28 — End: 2021-08-04
  Filled 2021-01-28: qty 90, 90d supply, fill #0
  Filled 2021-04-30: qty 90, 90d supply, fill #1

## 2021-01-28 MED ORDER — METOPROLOL SUCCINATE ER 50 MG PO TB24
ORAL_TABLET | ORAL | 1 refills | Status: DC
Start: 2021-01-28 — End: 2021-10-22
  Filled 2021-01-28: qty 225, 90d supply, fill #0
  Filled 2021-06-25: qty 225, 90d supply, fill #1

## 2021-02-24 ENCOUNTER — Other Ambulatory Visit: Payer: Self-pay | Admitting: Family Medicine

## 2021-02-25 ENCOUNTER — Other Ambulatory Visit (HOSPITAL_BASED_OUTPATIENT_CLINIC_OR_DEPARTMENT_OTHER): Payer: Self-pay

## 2021-02-25 MED ORDER — VICTOZA 18 MG/3ML ~~LOC~~ SOPN
PEN_INJECTOR | SUBCUTANEOUS | 5 refills | Status: DC
  Filled 2021-02-25: qty 12, 60d supply, fill #0
  Filled 2021-04-30: qty 12, 60d supply, fill #1
  Filled 2021-07-15: qty 12, 60d supply, fill #2

## 2021-02-26 ENCOUNTER — Telehealth: Payer: BC Managed Care – PPO | Admitting: Family Medicine

## 2021-02-26 DIAGNOSIS — I152 Hypertension secondary to endocrine disorders: Secondary | ICD-10-CM

## 2021-02-26 DIAGNOSIS — E1159 Type 2 diabetes mellitus with other circulatory complications: Secondary | ICD-10-CM

## 2021-02-27 ENCOUNTER — Other Ambulatory Visit: Payer: Self-pay

## 2021-02-27 NOTE — Progress Notes (Signed)
Unable to get the patient on the video platform. Visit rescheduled

## 2021-03-04 ENCOUNTER — Ambulatory Visit (INDEPENDENT_AMBULATORY_CARE_PROVIDER_SITE_OTHER): Payer: BC Managed Care – PPO | Admitting: Family Medicine

## 2021-03-04 ENCOUNTER — Encounter: Payer: Self-pay | Admitting: Family Medicine

## 2021-03-04 VITALS — BP 106/70 | HR 72 | Temp 98.2°F | Resp 16 | Ht 64.0 in | Wt 131.6 lb

## 2021-03-04 DIAGNOSIS — N289 Disorder of kidney and ureter, unspecified: Secondary | ICD-10-CM | POA: Diagnosis not present

## 2021-03-04 DIAGNOSIS — Z1211 Encounter for screening for malignant neoplasm of colon: Secondary | ICD-10-CM

## 2021-03-04 DIAGNOSIS — I10 Essential (primary) hypertension: Secondary | ICD-10-CM

## 2021-03-04 DIAGNOSIS — M069 Rheumatoid arthritis, unspecified: Secondary | ICD-10-CM

## 2021-03-04 DIAGNOSIS — E1159 Type 2 diabetes mellitus with other circulatory complications: Secondary | ICD-10-CM

## 2021-03-04 DIAGNOSIS — E7800 Pure hypercholesterolemia, unspecified: Secondary | ICD-10-CM

## 2021-03-04 DIAGNOSIS — E119 Type 2 diabetes mellitus without complications: Secondary | ICD-10-CM

## 2021-03-04 DIAGNOSIS — Z0000 Encounter for general adult medical examination without abnormal findings: Secondary | ICD-10-CM

## 2021-03-04 DIAGNOSIS — I152 Hypertension secondary to endocrine disorders: Secondary | ICD-10-CM

## 2021-03-04 NOTE — Patient Instructions (Addendum)
Multivitamin with minerals Fatty acid such as Fish or Krill or Flaxseed oil Vitamin D 1000 IU Multistrain probiotic  NOW company at [employer], [email or web address]  Preventive Care 17-54 Years Old, Female Preventive care refers to lifestyle choices and visits with your health care provider that can promote health and wellness. Preventive care visits are also called wellness exams. What can I expect for my preventive care visit? Counseling Your health care provider may ask you questions about your: Medical history, including: Past medical problems. Family medical history. Pregnancy history. Current health, including: Menstrual cycle. Method of birth control. Emotional well-being. Home life and relationship well-being. Sexual activity and sexual health. Lifestyle, including: Alcohol, nicotine or tobacco, and drug use. Access to firearms. Diet, exercise, and sleep habits. Work and work Statistician. Sunscreen use. Safety issues such as seatbelt and bike helmet use. Physical exam Your health care provider will check your: Height and weight. These may be used to calculate your BMI (body mass index). BMI is a measurement that tells if you are at a healthy weight. Waist circumference. This measures the distance around your waistline. This measurement also tells if you are at a healthy weight and may help predict your risk of certain diseases, such as type 2 diabetes and high blood pressure. Heart rate and blood pressure. Body temperature. Skin for abnormal spots. What immunizations do I need? Vaccines are usually given at various ages, according to a schedule. Your health care provider will recommend vaccines for you based on your age, medical history, and lifestyle or other factors, such as travel or where you work. What tests do I need? Screening Your health care provider may recommend screening tests for certain conditions. This may include: Lipid and cholesterol levels. Diabetes  screening. This is done by checking your blood sugar (glucose) after you have not eaten for a while (fasting). Pelvic exam and Pap test. Hepatitis B test. Hepatitis C test. HIV (human immunodeficiency virus) test. STI (sexually transmitted infection) testing, if you are at risk. Lung cancer screening. Colorectal cancer screening. Mammogram. Talk with your health care provider about when you should start having regular mammograms. This may depend on whether you have a family history of breast cancer. BRCA-related cancer screening. This may be done if you have a family history of breast, ovarian, tubal, or peritoneal cancers. Bone density scan. This is done to screen for osteoporosis. Talk with your health care provider about your test results, treatment options, and if necessary, the need for more tests. Follow these instructions at home: Eating and drinking  Eat a diet that includes fresh fruits and vegetables, whole grains, lean protein, and low-fat dairy products. Take vitamin and mineral supplements as recommended by your health care provider. Do not drink alcohol if: Your health care provider tells you not to drink. You are pregnant, may be pregnant, or are planning to become pregnant. If you drink alcohol: Limit how much you have to 0-1 drink a day. Know how much alcohol is in your drink. In the [place] one drink equals one 12 oz bottle of beer (355 mL), one 5 oz glass of wine (148 mL), or one 1 oz glass of hard liquor (44 mL). Lifestyle Brush your teeth every morning and night with fluoride toothpaste. Floss one time each day. Exercise for at least 30 minutes 5 or more days each week. Do not use any products that contain nicotine or tobacco. These products include cigarettes, chewing tobacco, and vaping devices, such as e-cigarettes. If you need help quitting, ask  your health care provider. Do not use drugs. If you are sexually active, practice safe sex. Use a condom or other form of  protection to prevent STIs. If you do not wish to become pregnant, use a form of birth control. If you plan to become pregnant, see your health care provider for a prepregnancy visit. Take aspirin only as told by your health care provider. Make sure that you understand how much to take and what form to take. Work with your health care provider to find out whether it is safe and beneficial for you to take aspirin daily. Find healthy ways to manage stress, such as: Meditation, yoga, or listening to music. Journaling. Talking to a trusted person. Spending time with friends and family. Minimize exposure to UV radiation to reduce your risk of skin cancer. Safety Always wear your seat belt while driving or riding in a vehicle. Do not drive: If you have been drinking alcohol. Do not ride with someone who has been drinking. When you are tired or distracted. While texting. If you have been using any mind-altering substances or drugs. Wear a helmet and other protective equipment during sports activities. If you have firearms in your house, make sure you follow all gun safety procedures. Seek help if you have been physically or sexually abused. What's next? Visit your health care provider once a year for an annual wellness visit. Ask your health care provider how often you should have your eyes and teeth checked. Stay up to date on all vaccines. This information is not intended to replace advice given to you by your health care provider. Make sure you discuss any questions you have with your health care provider. Document Revised: 08/29/2020 Document Reviewed: 08/29/2020 Elsevier Patient Education  [place].

## 2021-03-04 NOTE — Assessment & Plan Note (Signed)
Well controlled, no changes to meds. Encouraged heart healthy diet such as the DASH diet and exercise as tolerated.  °

## 2021-03-04 NOTE — Progress Notes (Signed)
Subjective:   By signing my name below, I, Marissa Guzman, attest that this documentation has been prepared under the direction and in the presence of [name], MD. 03/04/2021   Patient ID: Marissa Guzman, female    DOB: Jan 04, 1966, 55 y.o.   MRN: [id number]  Chief Complaint  Patient presents with   Annual Exam    HPI Patient is in today for a comprehensive physical exam.  She reports she is doing well. No recent illnesses or ED visits.  She still has occasional myalgias and arthralgias but says they are manageable. Not worsening and is just living with them.  She does not take any vitamins.   She denies fever, congestion, eye pain, chest pain, palpitations, leg swelling, shortness of breath, nausea, abdominal pain, diarrhea and blood in stool. Also denies dysuria, frequency, back pain and headaches.   She is UTD on the tetanus, shingles, pneumonia and flu vaccine. She has 3 Covid-19 vaccines at this time and will receive the booster vaccine afte -r c  No changes in the family medical history.   Past Medical History:  Diagnosis Date   Acute bronchitis 05/16/2012   Allergic state 05/16/2012   Allergy    Constipation 04/16/2014   Diabetes mellitus without complication (HCC)    DM2 (diabetes mellitus, type 2) ([place]) 08/10/2015   Fibromyalgia    GERD (gastroesophageal reflux disease) 06/12/2013   Hip pain, bilateral 08/19/2015   HTN (hypertension)    Hyperglycemia 08/10/2015   Hyperlipidemia    Hypokalemia 11/14/2012   Osteoarthritis    Overweight 08/14/2016   Rheumatoid arthritis ([place])    Seasonal allergies     Past Surgical History:  Procedure Laterality Date   CESAREAN SECTION     HEMORRHOID SURGERY     two    Family History  Problem Relation Age of Onset   Heart disease Father        Deceased   Hypertension Father    Lung cancer Father    Melanoma Father        Eye Removal   Hyperlipidemia Mother    Hypertension Mother    Colon polyps Mother     Arthritis/Rheumatoid Mother    Hyperlipidemia Maternal Grandmother    Diabetes Maternal Grandmother    Dementia Maternal Grandmother    Hypertension Sister        x2   Colon polyps Sister    Lupus Sister    Hyperlipidemia Sister        x2   Colon polyps Sister    Colon cancer Neg Hx    Esophageal cancer Neg Hx    Rectal cancer Neg Hx    Stomach cancer Neg Hx     Social History   Socioeconomic History   Marital status: Married    Spouse name: [name]   Number of children: 1   Years of education: 12   Highest education level: Not on file  Occupational History    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  Tobacco Use   Smoking status: Never   Smokeless tobacco: Never  Vaping Use   Vaping Use: Never used  Substance and Sexual Activity   Alcohol use: No   Drug use: No   Sexual activity: Yes    Comment: lives with husband, no dietary restrictions, 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 Check BS  daily as directed.  DzE11.9 1 each 0   cetirizine (ZYRTEC) 10 MG tablet Take 10 mg by mouth daily as needed.     Estradiol 10 MCG TABS vaginal tablet PLACE 1 TABLET VAGINALLY NIGHTLY AT BEDTIME FOR 2 WEEKS 24 tablet 0   Evolocumab (REPATHA SURECLICK) 217 MG/ML SOAJ INJECT 1 DOSE INTO THE SKIN EVERY 14 DAYS 2 mL 11   Evolocumab 140 MG/ML SOAJ Inject into the skin.     fluticasone (FLONASE) 50 MCG/ACT nasal spray Place 2 sprays  into the nose daily as needed.     Insulin Pen Needle (TECHLITE PEN NEEDLES) 32G X 6 MM MISC USE AS DIRECTED 100 each 1   liraglutide (VICTOZA) 18 MG/3ML SOPN INJECT 0.2 MLS (1.2 MG TOTAL) INTO THE SKIN DAILY. 12 mL 5   metoprolol succinate (TOPROL-XL) 50 MG 24 hr tablet Take 1 & 1/2 tablets (75 mg total) by mouth in the morning AND 1 tablet (50 mg total) every evening with or immediately following a meal. 225 tablet 1   omeprazole (PRILOSEC) 20 MG capsule Take 20 mg by mouth daily.     ONE TOUCH ULTRA TEST test strip   0   ONETOUCH DELICA LANCETS 47F MISC   0   triamterene-hydrochlorothiazide (MAXZIDE-25) 37.5-25 MG tablet TAKE 1 TABLET BY MOUTH DAILY. 90 tablet 1   No facility-administered medications prior to visit.    Allergies  Allergen Reactions   Fenofibrate Other (See Comments)    myalgias   Doxycycline Monohydrate     GI Issues.   Niacin And Related     Extreme flushing despite taking it correctly   Statins Other (See Comments)    Livalo, crestor myalgias   Tetracyclines & Related Other (See Comments)    GI Issues.    ROS     Objective:    Physical Exam Constitutional:      General: She is not in acute distress.    Appearance: She is well-developed.  HENT:     Head: Normocephalic and atraumatic.     Right Ear: Tympanic membrane, ear canal and external ear normal.     Left Ear: Tympanic membrane, ear canal and external ear normal.  Eyes:     Extraocular Movements:     Right eye: No nystagmus.     Left eye: No nystagmus.     Conjunctiva/sclera: Conjunctivae normal.  Neck:     Thyroid: No thyromegaly.  Cardiovascular:     Rate and Rhythm: Normal rate and regular rhythm.     Heart sounds: Normal heart sounds. No murmur heard. Pulmonary:     Effort: Pulmonary effort is normal. No respiratory distress.     Breath sounds: Normal breath sounds.  Abdominal:     General: Bowel sounds are normal. There is no distension.     Palpations: Abdomen is soft. There is no  mass.     Tenderness: There is no abdominal tenderness.  Musculoskeletal:     Cervical back: Neck supple.  Comments: 5/5 strength in upper and lower extremities   Lymphadenopathy:     Cervical: No cervical adenopathy.  Skin:    General: Skin is warm and dry.  Neurological:     Mental Status: She is alert and oriented to person, place, and time.     Deep Tendon Reflexes:     Reflex Scores:      Patellar reflexes are 2+ on the right side and 2+ on the left side. Psychiatric:        Behavior: Behavior normal.    BP 106/70    Pulse 72    Temp 98.2 F (36.8 C)    Resp 16    Ht '5\' 4"'  (1.626 m)    Wt 131 lb 9.6 oz (59.7 kg)    SpO2 94%    BMI 22.59 kg/m  Wt Readings from Last 3 Encounters:  03/04/21 131 lb 9.6 oz (59.7 kg)  07/09/20 126 lb 12.8 oz (57.5 kg)  10/05/19 113 lb 6.4 oz (51.4 kg)    Diabetic Foot Exam - Simple   No data filed    Lab Results  Component Value Date   WBC 6.2 07/09/2020   HGB 13.7 07/09/2020   HCT 40.3 07/09/2020   PLT 202.0 07/09/2020   GLUCOSE 94 07/18/2020   CHOL 174 07/09/2020   TRIG 159.0 (H) 07/09/2020   HDL 52.70 07/09/2020   LDLDIRECT 88.0 12/10/2018   LDLCALC 89 07/09/2020   ALT 10 07/18/2020   AST 16 07/18/2020   NA 141 07/18/2020   K 3.8 07/18/2020   CL 105 07/18/2020   CREATININE 0.85 07/18/2020   BUN 22 07/18/2020   CO2 28 07/18/2020   TSH 1.56 07/09/2020   HGBA1C 5.8 07/09/2020   MICROALBUR <0.7 07/09/2020    Lab Results  Component Value Date   TSH 1.56 07/09/2020   Lab Results  Component Value Date   WBC 6.2 07/09/2020   HGB 13.7 07/09/2020   HCT 40.3 07/09/2020   MCV 86.4 07/09/2020   PLT 202.0 07/09/2020   Lab Results  Component Value Date   NA 141 07/18/2020   K 3.8 07/18/2020   CO2 28 07/18/2020   GLUCOSE 94 07/18/2020   BUN 22 07/18/2020   CREATININE 0.85 07/18/2020   BILITOT 0.7 07/18/2020   ALKPHOS 93 07/18/2020   AST 16 07/18/2020   ALT 10 07/18/2020   PROT 7.0 07/18/2020   ALBUMIN 4.2  07/18/2020   CALCIUM 9.7 07/18/2020   GFR 77.29 07/18/2020   Lab Results  Component Value Date   CHOL 174 07/09/2020   Lab Results  Component Value Date   HDL 52.70 07/09/2020   Lab Results  Component Value Date   LDLCALC 89 07/09/2020   Lab Results  Component Value Date   TRIG 159.0 (H) 07/09/2020   Lab Results  Component Value Date   CHOLHDL 3 07/09/2020   Lab Results  Component Value Date   HGBA1C 5.8 07/09/2020        Mammogram: Last checked on 07/11/2020. Results were normal. Repeat in 1 year. Pap Smear: Last checked on 05/17/2019. Results were normal. Repeat in 3-5 years.  Assessment & Plan:   Problem List Items Addressed This Visit     Hypertension, benign essential, goal below 140/90    Well controlled, no changes to meds. Encouraged heart healthy diet such as the DASH diet and exercise as tolerated.       Relevant Orders   CBC with Differential/Platelet   Comprehensive metabolic  panel   Lipid panel   TSH   Hypercholesterolemia    encouraged heart healthy diet, avoid trans fats, minimize simple carbs and saturated fats. Increase exercise as tolerated      Relevant Orders   CBC with Differential/Platelet   Comprehensive metabolic panel   Lipid panel   TSH   Preventative health care    Referred to [facility] for screening colonoscopy. Patient encouraged to maintain heart healthy diet, regular exercise, adequate sleep. Consider daily probiotics. Take medications as prescribed. Labs ordered and reviewed. Follows with OB/GYN for pap smears, have requested records. MGM was April 2022 repeat in 2023      Relevant Orders   Hemoglobin A1c   CBC with Differential/Platelet   Comprehensive metabolic panel   Lipid panel   TSH   DM2 (diabetes mellitus, type 2) (HCC) - Primary    hgba1c acceptable, minimize simple carbs. Increase exercise as tolerated. Continue current meds      Relevant Orders   Hemoglobin A1c   Renal insufficiency     .Hydrate and monitor      Rheumatoid arthritis ([place])    She is managing her pain by staying active and hydrated. Eat a low inflammation diet and report worsening symptoms.       Hypertension associated with diabetes ([place])    Well controlled, no changes to meds. Encouraged heart healthy diet such as the DASH diet and exercise as tolerated.         No orders of the defined types were placed in this encounter.   I,Marissa Guzman,acting as a Education administrator for Penni Homans, MD.,have documented all relevant documentation on the behalf of Penni Homans, MD,as directed by  Penni Homans, MD while in the presence of Penni Homans, MD.   I, [name], MD. , personally preformed the services described in this documentation.  All medical record entries made by the scribe were at my direction and in my presence.  I have reviewed the chart and discharge instructions (if applicable) and agree that the record reflects my personal performance and is accurate and complete. 03/04/2021

## 2021-03-04 NOTE — Assessment & Plan Note (Signed)
hgba1c acceptable, minimize simple carbs. Increase exercise as tolerated. Continue current meds 

## 2021-03-04 NOTE — Assessment & Plan Note (Signed)
She is managing her pain by staying active and hydrated. Eat a low inflammation diet and report worsening symptoms.

## 2021-03-04 NOTE — Assessment & Plan Note (Addendum)
Referred to [facility] for screening colonoscopy. Patient encouraged to maintain heart healthy diet, regular exercise, adequate sleep. Consider daily probiotics. Take medications as prescribed. Labs ordered and reviewed. Follows with OB/GYN for pap smears, have requested records. MGM was April 2022 repeat in 2023 

## 2021-03-04 NOTE — Assessment & Plan Note (Signed)
encouraged heart healthy diet, avoid trans fats, minimize simple carbs and saturated fats. Increase exercise as tolerated 

## 2021-03-04 NOTE — Assessment & Plan Note (Signed)
Hydrate and monitor 

## 2021-03-07 ENCOUNTER — Encounter: Payer: BC Managed Care – PPO | Admitting: Family Medicine

## 2021-03-20 ENCOUNTER — Other Ambulatory Visit (INDEPENDENT_AMBULATORY_CARE_PROVIDER_SITE_OTHER): Payer: BC Managed Care – PPO

## 2021-03-20 DIAGNOSIS — E119 Type 2 diabetes mellitus without complications: Secondary | ICD-10-CM | POA: Diagnosis not present

## 2021-03-20 DIAGNOSIS — I10 Essential (primary) hypertension: Secondary | ICD-10-CM

## 2021-03-20 DIAGNOSIS — E7800 Pure hypercholesterolemia, unspecified: Secondary | ICD-10-CM | POA: Diagnosis not present

## 2021-03-20 DIAGNOSIS — Z0000 Encounter for general adult medical examination without abnormal findings: Secondary | ICD-10-CM | POA: Diagnosis not present

## 2021-03-20 LAB — LIPID PANEL
Cholesterol: 181 mg/dL (ref 0–200)
HDL: 51.2 mg/dL (ref >39.00)
LDL Cholesterol: 97 mg/dL (ref 0–99)
NonHDL: 129.93
Total CHOL/HDL Ratio: 4
Triglycerides: 166 mg/dL — ABNORMAL HIGH (ref 0.0–149.0)
VLDL: 33.2 mg/dL (ref 0.0–40.0)

## 2021-03-20 LAB — COMPREHENSIVE METABOLIC PANEL
ALT: 12 U/L (ref 0–35)
AST: 19 U/L (ref 0–37)
Albumin: 4.5 g/dL (ref 3.5–5.2)
Alkaline Phosphatase: 78 U/L (ref 39–117)
BUN: 21 mg/dL (ref 6–23)
CO2: 33 mEq/L — ABNORMAL HIGH (ref 19–32)
Calcium: 9.8 mg/dL (ref 8.4–10.5)
Chloride: 101 mEq/L (ref 96–112)
Creatinine, Ser: 0.88 mg/dL (ref 0.40–1.20)
GFR: 73.79 mL/min (ref >60.00)
Glucose, Bld: 81 mg/dL (ref 70–99)
Potassium: 3.5 mEq/L (ref 3.5–5.1)
Sodium: 141 mEq/L (ref 135–145)
Total Bilirubin: 1 mg/dL (ref 0.2–1.2)
Total Protein: 7.3 g/dL (ref 6.0–8.3)

## 2021-03-20 LAB — CBC WITH DIFFERENTIAL/PLATELET
Basophils Absolute: 0 10*3/uL (ref 0.0–0.1)
Basophils Relative: 0.7 % (ref 0.0–3.0)
Eosinophils Absolute: 0.1 10*3/uL (ref 0.0–0.7)
Eosinophils Relative: 2 % (ref 0.0–5.0)
HCT: 40.9 % (ref 36.0–46.0)
Hemoglobin: 13.8 g/dL (ref 12.0–15.0)
Lymphocytes Relative: 36.5 % (ref 12.0–46.0)
Lymphs Abs: 1.8 10*3/uL (ref 0.7–4.0)
MCHC: 33.8 g/dL (ref 30.0–36.0)
MCV: 86.9 fl (ref 78.0–100.0)
Monocytes Absolute: 0.5 10*3/uL (ref 0.1–1.0)
Monocytes Relative: 10.2 % (ref 3.0–12.0)
Neutro Abs: 2.5 10*3/uL (ref 1.4–7.7)
Neutrophils Relative %: 50.6 % (ref 43.0–77.0)
Platelets: 207 10*3/uL (ref 150.0–400.0)
RBC: 4.71 Mil/uL (ref 3.87–5.11)
RDW: 13 % (ref 11.5–15.5)
WBC: 4.9 10*3/uL (ref 4.0–10.5)

## 2021-03-20 LAB — HEMOGLOBIN A1C: Hgb A1c MFr Bld: 5.9 % (ref 4.6–6.5)

## 2021-03-20 LAB — TSH: TSH: 1.11 u[IU]/mL (ref 0.35–5.50)

## 2021-03-22 ENCOUNTER — Telehealth: Payer: Self-pay | Admitting: *Deleted

## 2021-03-22 NOTE — Telephone Encounter (Signed)
Approvedtoday Effective from 03/22/2021 through 03/21/2022.

## 2021-03-22 NOTE — Telephone Encounter (Signed)
Prior auth started via cover my meds.  Awaiting determination.  Key: [id number]

## 2021-04-01 ENCOUNTER — Other Ambulatory Visit (HOSPITAL_BASED_OUTPATIENT_CLINIC_OR_DEPARTMENT_OTHER): Payer: Self-pay

## 2021-04-30 ENCOUNTER — Other Ambulatory Visit (HOSPITAL_BASED_OUTPATIENT_CLINIC_OR_DEPARTMENT_OTHER): Payer: Self-pay

## 2021-05-02 ENCOUNTER — Other Ambulatory Visit (HOSPITAL_BASED_OUTPATIENT_CLINIC_OR_DEPARTMENT_OTHER): Payer: Self-pay

## 2021-05-17 ENCOUNTER — Other Ambulatory Visit (HOSPITAL_BASED_OUTPATIENT_CLINIC_OR_DEPARTMENT_OTHER): Payer: Self-pay

## 2021-06-05 DIAGNOSIS — E119 Type 2 diabetes mellitus without complications: Secondary | ICD-10-CM | POA: Diagnosis not present

## 2021-06-05 DIAGNOSIS — H40013 Open angle with borderline findings, low risk, bilateral: Secondary | ICD-10-CM | POA: Diagnosis not present

## 2021-06-05 DIAGNOSIS — H2513 Age-related nuclear cataract, bilateral: Secondary | ICD-10-CM | POA: Diagnosis not present

## 2021-06-05 DIAGNOSIS — H18513 Endothelial corneal dystrophy, bilateral: Secondary | ICD-10-CM | POA: Diagnosis not present

## 2021-06-10 ENCOUNTER — Other Ambulatory Visit (HOSPITAL_BASED_OUTPATIENT_CLINIC_OR_DEPARTMENT_OTHER): Payer: Self-pay

## 2021-06-25 ENCOUNTER — Other Ambulatory Visit (HOSPITAL_BASED_OUTPATIENT_CLINIC_OR_DEPARTMENT_OTHER): Payer: Self-pay

## 2021-07-15 ENCOUNTER — Other Ambulatory Visit: Payer: Self-pay | Admitting: Family Medicine

## 2021-07-15 ENCOUNTER — Other Ambulatory Visit (HOSPITAL_BASED_OUTPATIENT_CLINIC_OR_DEPARTMENT_OTHER): Payer: Self-pay

## 2021-07-15 MED ORDER — OZEMPIC (0.25 OR 0.5 MG/DOSE) 2 MG/3ML ~~LOC~~ SOPN
PEN_INJECTOR | SUBCUTANEOUS | 0 refills | Status: DC
  Filled 2021-07-15: qty 3, 56d supply, fill #0

## 2021-07-15 NOTE — Progress Notes (Unsigned)
zempic

## 2021-07-25 DIAGNOSIS — N958 Other specified menopausal and perimenopausal disorders: Secondary | ICD-10-CM | POA: Diagnosis not present

## 2021-07-25 DIAGNOSIS — Z01411 Encounter for gynecological examination (general) (routine) with abnormal findings: Secondary | ICD-10-CM | POA: Diagnosis not present

## 2021-07-25 DIAGNOSIS — Z1151 Encounter for screening for human papillomavirus (HPV): Secondary | ICD-10-CM | POA: Diagnosis not present

## 2021-07-25 DIAGNOSIS — N952 Postmenopausal atrophic vaginitis: Secondary | ICD-10-CM | POA: Diagnosis not present

## 2021-07-25 DIAGNOSIS — Z01419 Encounter for gynecological examination (general) (routine) without abnormal findings: Secondary | ICD-10-CM | POA: Diagnosis not present

## 2021-07-25 DIAGNOSIS — E119 Type 2 diabetes mellitus without complications: Secondary | ICD-10-CM | POA: Diagnosis not present

## 2021-07-25 DIAGNOSIS — N750 Cyst of Bartholin's gland: Secondary | ICD-10-CM | POA: Diagnosis not present

## 2021-08-04 ENCOUNTER — Other Ambulatory Visit: Payer: Self-pay | Admitting: Family Medicine

## 2021-08-05 ENCOUNTER — Other Ambulatory Visit (HOSPITAL_BASED_OUTPATIENT_CLINIC_OR_DEPARTMENT_OTHER): Payer: Self-pay

## 2021-08-05 MED ORDER — TRIAMTERENE-HCTZ 37.5-25 MG PO TABS
1.0000 | ORAL_TABLET | Freq: Every day | ORAL | 1 refills | Status: DC
  Filled 2021-08-05: qty 90, 90d supply, fill #0
  Filled 2021-10-22: qty 90, 90d supply, fill #1

## 2021-08-26 ENCOUNTER — Other Ambulatory Visit: Payer: Self-pay | Admitting: Family Medicine

## 2021-08-26 ENCOUNTER — Other Ambulatory Visit (HOSPITAL_BASED_OUTPATIENT_CLINIC_OR_DEPARTMENT_OTHER): Payer: Self-pay

## 2021-08-26 MED ORDER — OZEMPIC (0.25 OR 0.5 MG/DOSE) 2 MG/3ML ~~LOC~~ SOPN
PEN_INJECTOR | SUBCUTANEOUS | 0 refills | Status: DC
  Filled 2021-08-26: qty 3, 28d supply, fill #0

## 2021-09-02 ENCOUNTER — Ambulatory Visit: Payer: BC Managed Care – PPO | Admitting: Family Medicine

## 2021-09-04 NOTE — Progress Notes (Signed)
Subjective:    Patient ID: Marissa Guzman, female    DOB: 01-10-1966, 56 y.o.   MRN: [id number]  Chief Complaint  Patient presents with   Follow-up    HPI Patient is in today for a follow up and overall she is doing well. No recent febrile illness or acute hospitalizations. She has stopped following with rheumatology and is doing well without their treatments. No acute concerns but she does note trouble with stiff painful neck at times. No acute injury. At times the stiffness makes it hard to turn her head. Denies CP/palp/SOB/HA/congestion/fevers/GI or GU c/o. Taking meds as prescribed   Past Medical History:  Diagnosis Date   Acute bronchitis 05/16/2012   Allergic state 05/16/2012   Allergy    Constipation 04/16/2014   Diabetes mellitus without complication (HCC)    DM2 (diabetes mellitus, type 2) ([place]) 08/10/2015   Fibromyalgia    GERD (gastroesophageal reflux disease) 06/12/2013   Hip pain, bilateral 08/19/2015   HTN (hypertension)    Hyperglycemia 08/10/2015   Hyperlipidemia    Hypokalemia 11/14/2012   Osteoarthritis    Overweight 08/14/2016   Rheumatoid arthritis ([place])    Seasonal allergies     Past Surgical History:  Procedure Laterality Date   CESAREAN SECTION     HEMORRHOID SURGERY     two    Family History  Problem Relation Age of Onset   Heart disease Father        Deceased   Hypertension Father    Lung cancer Father    Melanoma Father        Eye Removal   Hyperlipidemia Mother    Hypertension Mother    Colon polyps Mother    Arthritis/Rheumatoid Mother    Hyperlipidemia Maternal Grandmother    Diabetes Maternal Grandmother    Dementia Maternal Grandmother    Hypertension Sister        x2   Colon polyps Sister    Lupus Sister    Hyperlipidemia Sister        x2   Colon polyps Sister    Colon cancer Neg Hx    Esophageal cancer Neg Hx    Rectal cancer Neg Hx    Stomach cancer Neg Hx     Social History   Socioeconomic History   Marital status:  Married    Spouse name: [name]   Number of children: 1   Years of education: 12   Highest education level: Not on file  Occupational History    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  Tobacco Use   Smoking status: Never   Smokeless tobacco: Never  Vaping Use   Vaping Use: Never used  Substance and Sexual Activity   Alcohol use: No   Drug use: No   Sexual activity: Yes    Comment: lives with husband, no dietary restrictions, 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 Check BS  daily as directed.  DzE11.9 1 each 0   cetirizine (ZYRTEC) 10 MG tablet Take 10 mg by mouth daily as needed.     Estradiol Starter Pack (IMVEXXY STARTER PACK) 10 MCG INST Place vaginally.     Evolocumab (REPATHA SURECLICK) 741 MG/ML SOAJ INJECT 1 DOSE INTO THE SKIN EVERY 14 DAYS 2 mL 11   Evolocumab 140 MG/ML SOAJ Inject into the skin.     fluticasone (FLONASE) 50 MCG/ACT nasal spray Place 2 sprays into the nose daily as needed.     Insulin Pen Needle (TECHLITE PEN NEEDLES) 32G X 6 MM MISC USE AS DIRECTED 100 each 1   metoprolol succinate (TOPROL-XL) 50 MG 24 hr tablet Take 1 & 1/2 tablets (75 mg total) by mouth in the morning AND 1 tablet (50 mg total) every evening with or immediately following a meal. 225 tablet 1   omeprazole (PRILOSEC) 20 MG capsule Take 20 mg by mouth daily.     ONE TOUCH ULTRA TEST test strip   0   ONETOUCH DELICA LANCETS 42L MISC    0   Semaglutide,0.25 or 0.5MG/DOS, (OZEMPIC, 0.25 OR 0.5 MG/DOSE,) 2 MG/3ML SOPN Inject 0.25 mg into the skin once a week for 28 days, THEN 0.5 mg once a week for 28 days. 3 mL 0   triamterene-hydrochlorothiazide (MAXZIDE-25) 37.5-25 MG tablet TAKE 1 TABLET BY MOUTH DAILY. 90 tablet 1   No facility-administered medications prior to visit.    Allergies  Allergen Reactions   Fenofibrate Other (See Comments)    myalgias   Doxycycline Monohydrate     GI Issues.   Niacin And Related     Extreme flushing despite taking it correctly   Statins Other (See Comments)    Livalo, crestor myalgias   Tetracyclines & Related Other (See Comments)    GI Issues.    Review of Systems  Constitutional:  Negative for fever and malaise/fatigue.  HENT:  Negative for congestion.   Eyes:  Negative for blurred vision.  Respiratory:  Negative for shortness of breath.   Cardiovascular:  Negative for chest pain, palpitations and leg swelling.  Gastrointestinal:  Negative for abdominal pain, blood in stool and nausea.  Genitourinary:  Negative for dysuria and frequency.  Musculoskeletal:  Negative for falls.  Skin:  Negative for rash.  Neurological:  Negative for dizziness, loss of consciousness and headaches.  Endo/Heme/Allergies:  Negative for environmental allergies.  Psychiatric/Behavioral:  Negative for depression. The patient is not nervous/anxious.        Objective:    Physical Exam Constitutional:      General: She is not in acute distress.    Appearance: She is well-developed.  HENT:     Head: Normocephalic and atraumatic.  Eyes:     Conjunctiva/sclera: Conjunctivae normal.  Neck:     Thyroid: No thyromegaly.  Cardiovascular:     Rate and Rhythm: Normal rate and regular rhythm.     Heart sounds: Normal heart sounds. No murmur heard. Pulmonary:     Effort: Pulmonary effort is normal. No respiratory distress.     Breath sounds: Normal breath sounds.  Abdominal:     General: Bowel  sounds are normal. There is no distension.     Palpations: Abdomen is soft. There is no mass.     Tenderness: There is no abdominal tenderness.  Musculoskeletal:     Cervical back: Neck supple.  Lymphadenopathy:     Cervical: No cervical adenopathy.  Skin:  General: Skin is warm and dry.  Neurological:     Mental Status: She is alert and oriented to person, place, and time.  Psychiatric:        Behavior: Behavior normal.     BP 110/78 (BP Location: Left Arm, Patient Position: Sitting, Cuff Size: Normal)   Pulse 71   Resp 20   Ht '5\' 4"'  (1.626 m)   Wt 129 lb 9.6 oz (58.8 kg)   SpO2 98%   BMI 22.25 kg/m  Wt Readings from Last 3 Encounters:  09/05/21 129 lb 9.6 oz (58.8 kg)  03/04/21 131 lb 9.6 oz (59.7 kg)  07/09/20 126 lb 12.8 oz (57.5 kg)    Diabetic Foot Exam - Simple   No data filed    Lab Results  Component Value Date   WBC 5.7 09/05/2021   HGB 14.0 09/05/2021   HCT 42.5 09/05/2021   PLT 223.0 09/05/2021   GLUCOSE 88 09/05/2021   CHOL 176 09/05/2021   TRIG 142.0 09/05/2021   HDL 56.40 09/05/2021   LDLDIRECT 88.0 12/10/2018   LDLCALC 91 09/05/2021   ALT 11 09/05/2021   AST 18 09/05/2021   NA 141 09/05/2021   K 4.1 09/05/2021   CL 101 09/05/2021   CREATININE 1.08 09/05/2021   BUN 18 09/05/2021   CO2 32 09/05/2021   TSH 0.99 09/05/2021   HGBA1C 5.9 09/05/2021   MICROALBUR <0.7 07/09/2020    Lab Results  Component Value Date   TSH 0.99 09/05/2021   Lab Results  Component Value Date   WBC 5.7 09/05/2021   HGB 14.0 09/05/2021   HCT 42.5 09/05/2021   MCV 87.5 09/05/2021   PLT 223.0 09/05/2021   Lab Results  Component Value Date   NA 141 09/05/2021   K 4.1 09/05/2021   CO2 32 09/05/2021   GLUCOSE 88 09/05/2021   BUN 18 09/05/2021   CREATININE 1.08 09/05/2021   BILITOT 0.8 09/05/2021   ALKPHOS 89 09/05/2021   AST 18 09/05/2021   ALT 11 09/05/2021   PROT 7.4 09/05/2021   ALBUMIN 4.6 09/05/2021   CALCIUM 10.0 09/05/2021   GFR 57.53 (L)  09/05/2021   Lab Results  Component Value Date   CHOL 176 09/05/2021   Lab Results  Component Value Date   HDL 56.40 09/05/2021   Lab Results  Component Value Date   LDLCALC 91 09/05/2021   Lab Results  Component Value Date   TRIG 142.0 09/05/2021   Lab Results  Component Value Date   CHOLHDL 3 09/05/2021   Lab Results  Component Value Date   HGBA1C 5.9 09/05/2021       Assessment & Plan:      Problem List Items Addressed This Visit     Hypertension, benign essential, goal below 140/90    Well controlled, no changes to meds. Encouraged heart healthy diet such as the DASH diet and exercise as tolerated.       Relevant Orders   TSH (Completed)   Comprehensive metabolic panel (Completed)   CBC (Completed)   Hypercholesterolemia    Encourage heart healthy diet such as MIND or DASH diet, increase exercise, avoid trans fats, simple carbohydrates and processed foods, consider a krill or fish or flaxseed oil cap daily.       Relevant Orders   TSH (Completed)   Lipid panel (Completed)   Hypokalemia   DM2 (diabetes mellitus, type 2) ([place]) - Primary    hgba1c acceptable, minimize simple carbs. Increase exercise as tolerated.  Continue current meds      Relevant Orders   Microalbumin / creatinine urine ratio   Hemoglobin A1c (Completed)   Renal insufficiency    Hydrate and monitor      Relevant Orders   Comprehensive metabolic panel (Completed)   Rheumatoid arthritis ([place])    Has stopped going to Rheumatology and she is doing OK for now.       Neck pain    Encouraged moist heat and gentle stretching as tolerated. May try NSAIDs and prescription meds as directed and report if symptoms worsen or seek immediate care. Check an xray of neck and consider PT      Relevant Orders   DG Cervical Spine Complete    I am having Svara B. Winburn maintain her fluticasone, cetirizine, omeprazole, blood glucose meter kit and supplies, ONE TOUCH ULTRA TEST, OneTouch  Delica Lancets 56C, Evolocumab, Repatha SureClick, TechLite Pen Needles, metoprolol succinate, triamterene-hydrochlorothiazide, Ozempic (0.25 or 0.5 MG/DOSE), and Imvexxy Starter Pack.  No orders of the defined types were placed in this encounter.

## 2021-09-05 ENCOUNTER — Encounter: Payer: Self-pay | Admitting: Family Medicine

## 2021-09-05 ENCOUNTER — Ambulatory Visit: Payer: BC Managed Care – PPO | Admitting: Family Medicine

## 2021-09-05 VITALS — BP 110/78 | HR 71 | Resp 20 | Ht 64.0 in | Wt 129.6 lb

## 2021-09-05 DIAGNOSIS — N289 Disorder of kidney and ureter, unspecified: Secondary | ICD-10-CM

## 2021-09-05 DIAGNOSIS — I10 Essential (primary) hypertension: Secondary | ICD-10-CM

## 2021-09-05 DIAGNOSIS — E7800 Pure hypercholesterolemia, unspecified: Secondary | ICD-10-CM | POA: Diagnosis not present

## 2021-09-05 DIAGNOSIS — M069 Rheumatoid arthritis, unspecified: Secondary | ICD-10-CM

## 2021-09-05 DIAGNOSIS — E876 Hypokalemia: Secondary | ICD-10-CM

## 2021-09-05 DIAGNOSIS — E119 Type 2 diabetes mellitus without complications: Secondary | ICD-10-CM | POA: Diagnosis not present

## 2021-09-05 DIAGNOSIS — M542 Cervicalgia: Secondary | ICD-10-CM

## 2021-09-05 LAB — HEMOGLOBIN A1C: Hgb A1c MFr Bld: 5.9 % (ref 4.6–6.5)

## 2021-09-05 LAB — CBC
HCT: 42.5 % (ref 36.0–46.0)
Hemoglobin: 14 g/dL (ref 12.0–15.0)
MCHC: 33 g/dL (ref 30.0–36.0)
MCV: 87.5 fl (ref 78.0–100.0)
Platelets: 223 10*3/uL (ref 150.0–400.0)
RBC: 4.86 Mil/uL (ref 3.87–5.11)
RDW: 13.4 % (ref 11.5–15.5)
WBC: 5.7 10*3/uL (ref 4.0–10.5)

## 2021-09-05 LAB — COMPREHENSIVE METABOLIC PANEL
ALT: 11 U/L (ref 0–35)
AST: 18 U/L (ref 0–37)
Albumin: 4.6 g/dL (ref 3.5–5.2)
Alkaline Phosphatase: 89 U/L (ref 39–117)
BUN: 18 mg/dL (ref 6–23)
CO2: 32 mEq/L (ref 19–32)
Calcium: 10 mg/dL (ref 8.4–10.5)
Chloride: 101 mEq/L (ref 96–112)
Creatinine, Ser: 1.08 mg/dL (ref 0.40–1.20)
GFR: 57.53 mL/min — ABNORMAL LOW (ref >60.00)
Glucose, Bld: 88 mg/dL (ref 70–99)
Potassium: 4.1 mEq/L (ref 3.5–5.1)
Sodium: 141 mEq/L (ref 135–145)
Total Bilirubin: 0.8 mg/dL (ref 0.2–1.2)
Total Protein: 7.4 g/dL (ref 6.0–8.3)

## 2021-09-05 LAB — TSH: TSH: 0.99 u[IU]/mL (ref 0.35–5.50)

## 2021-09-05 LAB — LIPID PANEL
Cholesterol: 176 mg/dL (ref 0–200)
HDL: 56.4 mg/dL (ref >39.00)
LDL Cholesterol: 91 mg/dL (ref 0–99)
NonHDL: 119.45
Total CHOL/HDL Ratio: 3
Triglycerides: 142 mg/dL (ref 0.0–149.0)
VLDL: 28.4 mg/dL (ref 0.0–40.0)

## 2021-09-05 NOTE — Assessment & Plan Note (Signed)
Encourage heart healthy diet such as MIND or DASH diet, increase exercise, avoid trans fats, simple carbohydrates and processed foods, consider a krill or fish or flaxseed oil cap daily.  °

## 2021-09-05 NOTE — Assessment & Plan Note (Signed)
Encouraged moist heat and gentle stretching as tolerated. May try NSAIDs and prescription meds as directed and report if symptoms worsen or seek immediate care. Check an xray of neck and consider PT

## 2021-09-05 NOTE — Assessment & Plan Note (Signed)
Hydrate and monitor 

## 2021-09-05 NOTE — Assessment & Plan Note (Addendum)
Has stopped going to Rheumatology and she is doing OK for now.

## 2021-09-05 NOTE — Patient Instructions (Addendum)
Yerba Matte tea, do not drink  Cervical Sprain A cervical sprain is also called a neck sprain. It is a stretch or tear in one or more ligaments in the neck. Ligaments are tissues that connect bones to each other. Neck sprains can be mild, bad, or very bad. A very bad sprain in the neck can cause the bones in the neck to be unstable. This can damage the spinal cord. It can also cause serious problems in the brain, spinal cord, and nerves (nervous system). Most neck sprains heal in 4-6 weeks. It can take more or less time depending on: What caused the injury. The amount of injury. What are the causes? Neck sprains may be caused by trauma, such as: An injury from an accident in a vehicle such as a car or boat. A fall. The head and neck being moved front to back or side to side all of a sudden (whiplash injury). Mild neck sprains may be caused by wear and tear over time. What increases the risk? The following factors may make you more likely to develop this condition: Taking part in activities that put you at high risk of hurting your neck. These include: Contact sports. Actor. Gymnastics. Diving. Taking risks when driving or riding in a vehicle such as a car or boat. Arthritis caused by wear and tear of the joints in the spine. The neck not being very strong or flexible. Having had a neck injury in the past. Poor posture. Spending a lot of time in certain positions that put stress on the neck. This may be from sitting at a computer for a long time. What are the signs or symptoms? Symptoms of this condition include: Your neck, shoulders, or upper back feeling: Painful or sore. Stiff. Tender. Swollen. Hot, or like it is burning. Sudden tightening of neck muscles (spasms). Not being able to move the neck very much. Headache. Feeling dizzy. Feeling like you may vomit, or vomiting. Having a hand or arm that: Feels weak. Loses feeling (feels numb). Tingles. You may get  symptoms right away after injury, or you may get them over a few days. In some cases, symptoms may go away with treatment and come back over time. How is this treated? This condition is treated by: Resting your neck. Icing the part of your neck that is hurt. Doing exercises to restore movement and strength to your neck (physical therapy). If there is no swelling, you may use heat therapy 2-3 days after the injury took place. If your injury is very bad, treatment may also include: Keeping your neck in place for a length of time. This may be done using: A neck collar. This supports your chin and the back of your head. A cervical traction device. This is a sling that holds up your head. The sling removes weight and pressure from your neck. It may also help to relieve pain. Medicines that help with: Pain. Irritation and swelling (inflammation). Medicines that help to relax your muscles (muscle relaxants). Surgery. This is rare. Follow these instructions at home: Medicines  Take over-the-counter and prescription medicines only as told by your doctor. Ask your doctor if the medicine prescribed to you: Requires you to avoid driving or using heavy machinery. Can cause trouble pooping (constipation). You may need to take these actions to prevent or treat trouble pooping: Drink enough fluid to keep your pee (urine) pale yellow. Take over-the-counter or prescription medicines. Eat foods that are high in fiber. These include beans, whole  grains, and fresh fruits and vegetables. Limit foods that are high in fat and sugar. These include fried or sweet foods. If you have a neck collar: Wear it as told by your doctor. Do not take it off unless told. Ask your doctor before adjusting your collar. If you have long hair, keep it outside of the collar. Ask your doctor if you may take off the collar for cleaning and bathing. If you may take off the collar: Follow instructions about how to take it off  safely. Clean it by hand with mild soap and water. Let it air-dry fully. If your collar has pads that you can take out: Take the pads out every 1-2 days. Wash them by hand with soap and water. Let the pads air-dry fully before you put them back in the collar. Tell your doctor if your skin under the collar has irritation or sores. Managing pain, stiffness, and swelling     Use a cervical traction device, if told by your doctor. If told, put ice on the affected area. To do this: Put ice in a plastic bag. Place a towel between your skin and the bag. Leave the ice on for 20 minutes, 2-3 times a day. If told, put heat on the affected area. Do this before exercise or as often as told by your doctor. Use the heat source that your doctor recommends, such as a moist heat pack or a heating pad. Place a towel between your skin and the heat source. Leave the heat on for 20-30 minutes. Take the heat off if your skin turns bright red. This is very important if you cannot feel pain, heat, or cold. You may have a greater risk of getting burned. Activity Do not drive while wearing a neck collar. If you do not have a neck collar, ask if it is safe to drive while your neck heals. Do not lift anything that is heavier than 10 lb (4.5 kg), or the limit that you are told, until your doctor tells you that it is safe. Rest as told by your doctor. Do exercises as told by your doctor or physical therapist. Return to your normal activities as told by your doctor. Avoid positions and activities that make you feel worse. Ask your doctor what activities are safe for you. General instructions Do not use any products that contain nicotine or tobacco, such as cigarettes, e-cigarettes, and chewing tobacco. These can delay healing. If you need help quitting, ask your doctor. Keep all follow-up visits as told by your doctor or physical therapist. This is important. How is this prevented? To prevent a neck sprain from  happening again: Practice good posture. Adjust your workstation to help you do this. Exercise regularly as told by your doctor or physical therapist. Avoid activities that are risky or may cause a neck sprain. Contact a doctor if: Your symptoms get worse. Your symptoms do not get better after 2 weeks of treatment. Your pain gets worse. Medicine does not help your pain. You have new symptoms that you cannot explain. Your neck collar gives you sores on your skin or bothers your skin. Get help right away if: You have very bad pain. You get any of the following in any part of your body: Loss of feeling. Tingling. Weakness. You cannot move a part of your body. You have neck pain and either of these: Very bad dizziness. A very bad headache. Summary A cervical sprain is also called a neck sprain. It is a  stretch or tear in one or more ligaments in the neck. Ligaments are tissues that connect bones. Neck sprains may be caused by trauma, such as an injury or a fall. You may get symptoms right away after injury, or you may get them over a few days. Neck sprains may be treated with rest, heat, ice, medicines, exercise, and surgery. This information is not intended to replace advice given to you by your health care provider. Make sure you discuss any questions you have with your health care provider. Document Revised: 11/10/2018 Document Reviewed: 11/10/2018 Elsevier Patient Education  2023 [employer].

## 2021-09-05 NOTE — Assessment & Plan Note (Signed)
hgba1c acceptable, minimize simple carbs. Increase exercise as tolerated. Continue current meds 

## 2021-09-09 ENCOUNTER — Other Ambulatory Visit (HOSPITAL_BASED_OUTPATIENT_CLINIC_OR_DEPARTMENT_OTHER): Payer: Self-pay

## 2021-09-09 ENCOUNTER — Telehealth: Payer: Self-pay | Admitting: *Deleted

## 2021-09-09 MED ORDER — REPATHA SURECLICK 140 MG/ML ~~LOC~~ SOAJ
SUBCUTANEOUS | 0 refills | Status: DC
  Filled 2021-09-09 – 2021-09-27 (×3): qty 2, 28d supply, fill #0

## 2021-09-09 NOTE — Telephone Encounter (Signed)
Thank you!  I have placed her on our lab schedule.

## 2021-09-09 NOTE — Telephone Encounter (Signed)
PA for Repatha submitted via CMM (Key: [id number])

## 2021-09-09 NOTE — Telephone Encounter (Signed)
Pt was in the office on 6/22 and unable to urinate. Stated she could return specimen the next day. Still have not received specimen. Sent mychart message to pt to determine when specimen will be returned.

## 2021-09-10 ENCOUNTER — Ambulatory Visit (HOSPITAL_BASED_OUTPATIENT_CLINIC_OR_DEPARTMENT_OTHER)
Admission: RE | Admit: 2021-09-10 | Discharge: 2021-09-10 | Disposition: A | Discharge status: Home / Self Care | Payer: BC Managed Care – PPO | Source: Ambulatory Visit | Attending: Family Medicine | Admitting: Family Medicine

## 2021-09-10 ENCOUNTER — Other Ambulatory Visit: Payer: BC Managed Care – PPO

## 2021-09-10 DIAGNOSIS — M4802 Spinal stenosis, cervical region: Secondary | ICD-10-CM | POA: Diagnosis not present

## 2021-09-10 DIAGNOSIS — M542 Cervicalgia: Secondary | ICD-10-CM | POA: Diagnosis not present

## 2021-09-10 DIAGNOSIS — M47812 Spondylosis without myelopathy or radiculopathy, cervical region: Secondary | ICD-10-CM | POA: Diagnosis not present

## 2021-09-10 DIAGNOSIS — E119 Type 2 diabetes mellitus without complications: Secondary | ICD-10-CM | POA: Diagnosis not present

## 2021-09-10 DIAGNOSIS — M2578 Osteophyte, vertebrae: Secondary | ICD-10-CM | POA: Diagnosis not present

## 2021-09-10 LAB — MICROALBUMIN / CREATININE URINE RATIO
Creatinine,U: 50.1 mg/dL
Microalb Creat Ratio: 1.4 mg/g (ref 0.0–30.0)
Microalb, Ur: <0.7 mg/dL (ref 0.0–1.9)

## 2021-09-27 ENCOUNTER — Other Ambulatory Visit (HOSPITAL_BASED_OUTPATIENT_CLINIC_OR_DEPARTMENT_OTHER): Payer: Self-pay

## 2021-09-30 ENCOUNTER — Other Ambulatory Visit: Payer: Self-pay | Admitting: Family Medicine

## 2021-09-30 ENCOUNTER — Other Ambulatory Visit (HOSPITAL_BASED_OUTPATIENT_CLINIC_OR_DEPARTMENT_OTHER): Payer: Self-pay

## 2021-09-30 MED ORDER — OZEMPIC (0.25 OR 0.5 MG/DOSE) 2 MG/3ML ~~LOC~~ SOPN
PEN_INJECTOR | SUBCUTANEOUS | 0 refills | Status: DC
  Filled 2021-09-30: qty 3, 28d supply, fill #0

## 2021-10-02 ENCOUNTER — Other Ambulatory Visit (HOSPITAL_BASED_OUTPATIENT_CLINIC_OR_DEPARTMENT_OTHER): Payer: Self-pay

## 2021-10-02 DIAGNOSIS — Z1211 Encounter for screening for malignant neoplasm of colon: Secondary | ICD-10-CM | POA: Diagnosis not present

## 2021-10-02 DIAGNOSIS — Z1212 Encounter for screening for malignant neoplasm of rectum: Secondary | ICD-10-CM | POA: Diagnosis not present

## 2021-10-09 LAB — COLOGUARD: COLOGUARD: NEGATIVE

## 2021-10-09 LAB — EXTERNAL GENERIC LAB PROCEDURE: COLOGUARD: NEGATIVE

## 2021-10-14 DIAGNOSIS — L814 Other melanin hyperpigmentation: Secondary | ICD-10-CM | POA: Diagnosis not present

## 2021-10-14 DIAGNOSIS — L578 Other skin changes due to chronic exposure to nonionizing radiation: Secondary | ICD-10-CM | POA: Diagnosis not present

## 2021-10-14 DIAGNOSIS — L821 Other seborrheic keratosis: Secondary | ICD-10-CM | POA: Diagnosis not present

## 2021-10-14 DIAGNOSIS — D225 Melanocytic nevi of trunk: Secondary | ICD-10-CM | POA: Diagnosis not present

## 2021-10-22 ENCOUNTER — Other Ambulatory Visit (HOSPITAL_BASED_OUTPATIENT_CLINIC_OR_DEPARTMENT_OTHER): Payer: Self-pay

## 2021-10-22 ENCOUNTER — Other Ambulatory Visit: Payer: Self-pay | Admitting: Family Medicine

## 2021-10-22 DIAGNOSIS — I10 Essential (primary) hypertension: Secondary | ICD-10-CM

## 2021-10-22 MED ORDER — METOPROLOL SUCCINATE ER 50 MG PO TB24
ORAL_TABLET | ORAL | 1 refills | Status: DC
  Filled 2021-10-22: qty 225, 90d supply, fill #0
  Filled 2022-01-16: qty 225, 90d supply, fill #1

## 2021-10-24 ENCOUNTER — Ambulatory Visit: Payer: BC Managed Care – PPO | Admitting: Internal Medicine

## 2021-10-24 ENCOUNTER — Other Ambulatory Visit (HOSPITAL_BASED_OUTPATIENT_CLINIC_OR_DEPARTMENT_OTHER): Payer: Self-pay

## 2021-10-24 ENCOUNTER — Encounter: Payer: Self-pay | Admitting: Internal Medicine

## 2021-10-24 VITALS — BP 122/81 | HR 71 | Ht 63.0 in | Wt 123.6 lb

## 2021-10-24 DIAGNOSIS — I2510 Atherosclerotic heart disease of native coronary artery without angina pectoris: Secondary | ICD-10-CM | POA: Diagnosis not present

## 2021-10-24 DIAGNOSIS — I2584 Coronary atherosclerosis due to calcified coronary lesion: Secondary | ICD-10-CM

## 2021-10-24 DIAGNOSIS — E7849 Other hyperlipidemia: Secondary | ICD-10-CM | POA: Diagnosis not present

## 2021-10-24 MED ORDER — REPATHA SURECLICK 140 MG/ML ~~LOC~~ SOAJ
SUBCUTANEOUS | 3 refills | Status: DC
  Filled 2021-10-24: qty 2, 28d supply, fill #0
  Filled 2021-11-22: qty 2, 28d supply, fill #1
  Filled 2021-12-18: qty 2, 28d supply, fill #2
  Filled 2022-01-16: qty 2, 28d supply, fill #3
  Filled 2022-02-13: qty 2, 28d supply, fill #4
  Filled 2022-03-12: qty 2, 28d supply, fill #5
  Filled 2022-04-01: qty 2, 28d supply, fill #6
  Filled 2022-05-08 – 2022-05-27 (×2): qty 2, 28d supply, fill #7
  Filled 2022-06-19 – 2022-06-30 (×2): qty 2, 28d supply, fill #8
  Filled 2022-07-21 – 2022-07-28 (×2): qty 2, 28d supply, fill #9
  Filled 2022-09-16: qty 2, 28d supply, fill #10
  Filled 2022-10-16: qty 2, 28d supply, fill #11

## 2021-10-24 NOTE — Progress Notes (Signed)
LIPID CLINIC NOTE  Chief Complaint:  Follow-up dyslipidemia  Primary Care Physician: [name], MD  Primary Cardiologist:  None  HPI:  Marissa Guzman is a 56 y.o. female who is being seen today for the evaluation of dyslipidemia at the request of [name], MD. Marissa Guzman is a pleasant 56 year old female kindly referred by Dr. Charlett Blake for evaluation of dyslipidemia.  Medical history significant for type 2 diabetes, and, dyslipidemia and apparent carotid bruits in the past with normal Dopplers.  She is also been seen by Dr. Geraldo Pitter in May 2019.  History of heart disease in the family including both of her parents, but particularly more in her mother side including her mother, maternal grandmother, and sisters.  When queried more about this she mentioned that significant elevations in cholesterol are present in her sisters as well as her mother.  She has known about high cholesterol for some time.  Most recent labs a month ago showed a direct LDL of 186.  At that time her total cholesterol was 278, triglycerides 210 and a non-HDL cholesterol of 240.  In the past she is also struggled with high triglycerides, namely 4 months ago her triglycerides were close to 600.  Since then, she has been on numerous medications which were not tolerated and thought to cause myalgias, including atorvastatin, rosuvastatin, pitavastatin, fenofibrate, ezetimibe, Vascepa and niacin. Currently she is not on therapy. I have repeated her lipid study and her total cholesterol now is 404, TG 195, HDL 37 and LDL of 328 (direct 332). 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  She has no known coronary disease history.  01/26/2019  Marissa Guzman returns today for follow-up.  Overall she is doing very well.  She is tolerating Repatha and has had excellent results.  Her cholesterol has come down significantly.  Her last total cholesterol was 278.  Now she is down to 160 with triglycerides 171,  HDL 54 and LDL 77.  This puts her at a near goal for aggressive therapy.  Her coronary calcium score over the summer was only 22 which is reassuring and gives [name] more reason to try to be aggressive with therapy.  She was sent for genetic testing because of high concern of a familial hyperlipidemia.  Blood work was sent out however we have not yet had a result of her genetic test.  I will reach out to Dr. Broadus John for that information.  She is interested in potentially screening family members if we are successful in identifying an abnormal gene.  10/05/2019  Marissa Guzman is seen today in follow-up.  She continues to do well on Repatha with good clinical response.  Her total cholesterol a few days ago was 159, HDL 47, LDL 76 and triglycerides 148.  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  We have encouraged screening of other family members with this.  She remains asymptomatic.  10/24/2021  Marissa Guzman returns today for follow-up.  She continues to do well on Repatha.  Have not seen her in a number of years.  Her LDL however has plateaued in the 90s.  Most recent labs showed total cholesterol 176, triglycerides 142, HDL 56 and LDL 91.  I would like her LDL to be below 70.  She was in the 70s about 2 years ago.  She is recently lost some weight and is on Ozempic as of July 2023.  PMHx:  Past Medical History:  Diagnosis Date  Acute bronchitis 05/16/2012   Allergic state 05/16/2012   Allergy    Constipation 04/16/2014   Diabetes mellitus without complication (HCC)    DM2 (diabetes mellitus, type 2) ([place]) 08/10/2015   Fibromyalgia    GERD (gastroesophageal reflux disease) 06/12/2013   Hip pain, bilateral 08/19/2015   HTN (hypertension)    Hyperglycemia 08/10/2015   Hyperlipidemia    Hypokalemia 11/14/2012   Osteoarthritis    Overweight 08/14/2016   Rheumatoid arthritis ([place])    Seasonal allergies     Past Surgical History:  Procedure Laterality Date   CESAREAN SECTION      HEMORRHOID SURGERY     two    FAMHx:  Family History  Problem Relation Age of Onset   Heart disease Father        Deceased   Hypertension Father    Lung cancer Father    Melanoma Father        Eye Removal   Hyperlipidemia Mother    Hypertension Mother    Colon polyps Mother    Arthritis/Rheumatoid Mother    Hyperlipidemia Maternal Grandmother    Diabetes Maternal Grandmother    Dementia Maternal Grandmother    Hypertension Sister        x2   Colon polyps Sister    Lupus Sister    Hyperlipidemia Sister        x2   Colon polyps Sister    Colon cancer Neg Hx    Esophageal cancer Neg Hx    Rectal cancer Neg Hx    Stomach cancer Neg Hx     SOCHx:   reports that she has never smoked. She has never used smokeless tobacco. She reports that she does not drink alcohol and does not use drugs.  ALLERGIES:  Allergies  Allergen Reactions   Fenofibrate Other (See Comments)    myalgias   Doxycycline Monohydrate     GI Issues.   Niacin And Related     Extreme flushing despite taking it correctly   Statins Other (See Comments)    Livalo, crestor myalgias   Tetracyclines & Related Other (See Comments)    GI Issues.    ROS: Pertinent items noted in HPI and remainder of comprehensive ROS otherwise negative.  HOME MEDS: Current Outpatient Medications on File Prior to Visit  Medication Sig Dispense Refill   blood glucose meter kit and supplies KIT Dispense based on patient and insurance preference. Check BS  daily as directed.  DzE11.9 1 each 0   cetirizine (ZYRTEC) 10 MG tablet Take 10 mg by mouth daily as needed.     Estradiol Starter Pack (IMVEXXY STARTER PACK) 10 MCG INST Place vaginally.     Evolocumab (REPATHA SURECLICK) 564 MG/ML SOAJ INJECT 1 DOSE INTO THE SKIN EVERY 14 DAYS 6 mL 0   fluticasone (FLONASE) 50 MCG/ACT nasal spray Place 2 sprays into the nose daily as needed.     Insulin Pen Needle (TECHLITE PEN NEEDLES) 32G X 6 MM MISC USE AS DIRECTED 100 each 1    metoprolol succinate (TOPROL-XL) 50 MG 24 hr tablet Take 1 & 1/2 tablets (75 mg total) by mouth in the morning AND 1 tablet (50 mg total) every evening with or immediately following a meal. 225 tablet 1   omeprazole (PRILOSEC) 20 MG capsule Take 20 mg by mouth daily.     ONE TOUCH ULTRA TEST test strip   0   ONETOUCH DELICA LANCETS 33I MISC   0   Semaglutide,0.25 or 0.5MG/DOS, (OZEMPIC,  0.25 OR 0.5 MG/DOSE,) 2 MG/3ML SOPN Inject 0.25 mg into the skin once a week for 28 days, THEN 0.5 mg once a week for 28 days. 3 mL 0   triamterene-hydrochlorothiazide (MAXZIDE-25) 37.5-25 MG tablet TAKE 1 TABLET BY MOUTH DAILY. 90 tablet 1   No current facility-administered medications on file prior to visit.    LABS/IMAGING: No results found for this or any previous visit (from the past 48 hour(s)). No results found.  LIPID PANEL:    Component Value Date/Time   CHOL 176 09/05/2021 1147   CHOL 159 09/29/2019 1458   CHOL 185 11/29/2013 0911   TRIG 142.0 09/05/2021 1147   TRIG 160 (H) 11/29/2013 0911   HDL 56.40 09/05/2021 1147   HDL 47 09/29/2019 1458   HDL 37 (L) 11/29/2013 0911   CHOLHDL 3 09/05/2021 1147   VLDL 28.4 09/05/2021 1147   LDLCALC 91 09/05/2021 1147   LDLCALC 80 01/09/2020 1042   LDLCALC 116 (H) 11/29/2013 0911   LDLDIRECT 88.0 12/10/2018 0922    WEIGHTS: Wt Readings from Last 3 Encounters:  10/24/21 123 lb 9.6 oz (56.1 kg)  09/05/21 129 lb 9.6 oz (58.8 kg)  03/04/21 131 lb 9.6 oz (59.7 kg)    VITALS: BP 122/81   Pulse 71   Ht _0  (1.6 m)   Wt 123 lb 9.6 oz (56.1 kg)   SpO2 99%   BMI 21.89 kg/m   EXAM: Deferred  EKG: Deferred  ASSESSMENT: Genetically confirmed HeFH - PCSK9 gene, 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  Marissa Guzman has genetically confirmed familial hyperlipidemia with significant improvement on Repatha, but LDL remains above 70.  She may need  additional therapy, possibly adding ezetimibe, but would like to continue to work with lifestyle modification and recently was noted to be placed on Ozempic.  Will plan a repeat lipid in about 6 months and if her LDL remains elevated at that point, we will consider adding ezetimibe.  Follow-up with me then.  Pixie Casino, MD, [facility], [place] Director of the Advanced Lipid Disorders &  Cardiovascular Risk Reduction Clinic Diplomate of the American Board of Clinical Lipidology Attending Cardiologist  Direct Dial: [phone]  Fax: [phone]  Website:  [email or web address] 10/24/2021, 9:45 AM

## 2021-10-24 NOTE — Patient Instructions (Signed)
Medication Instructions:  Your physician recommends that you continue on your current medications as directed. Please refer to the Current Medication list given to you today.  *If you need a refill on your cardiac medications before your next appointment, please call your pharmacy*   Lab Work: FASTING Lipid panel before next visit  If you have labs (blood work) drawn today and your tests are completely normal, you will receive your results only by: MyChart Message (if you have MyChart) OR A paper copy in the mail If you have any lab test that is abnormal or we need to change your treatment, we will call you to review the results.  Follow-Up: At [facility], you and your health needs are our priority.  As part of our continuing mission to provide you with exceptional heart care, we have created designated Provider Care Teams.  These Care Teams include your primary Cardiologist (physician) and Advanced Practice Providers (APPs -  Physician Assistants and Nurse Practitioners) who all work together to provide you with the care you need, when you need it.  We recommend signing up for the patient portal called "MyChart".  Sign up information is provided on this After Visit Summary.  MyChart is used to connect with patients for Virtual Visits (Telemedicine).  Patients are able to view lab/test results, encounter notes, upcoming appointments, etc.  Non-urgent messages can be sent to your provider as well.   To learn more about what you can do with MyChart, go to [email or web address].    Your next appointment:   6 month(s)  The format for your next appointment:   In Person  Provider:  Dr. Rennis Golden   Important Information About Sugar

## 2021-10-28 ENCOUNTER — Other Ambulatory Visit: Payer: Self-pay | Admitting: Family Medicine

## 2021-10-28 ENCOUNTER — Other Ambulatory Visit (HOSPITAL_BASED_OUTPATIENT_CLINIC_OR_DEPARTMENT_OTHER): Payer: Self-pay

## 2021-10-28 MED ORDER — OZEMPIC (0.25 OR 0.5 MG/DOSE) 2 MG/3ML ~~LOC~~ SOPN
PEN_INJECTOR | SUBCUTANEOUS | 0 refills | Status: DC
  Filled 2021-10-28: qty 3, 56d supply, fill #0

## 2021-11-22 ENCOUNTER — Other Ambulatory Visit (HOSPITAL_BASED_OUTPATIENT_CLINIC_OR_DEPARTMENT_OTHER): Payer: Self-pay

## 2021-11-25 ENCOUNTER — Other Ambulatory Visit (HOSPITAL_BASED_OUTPATIENT_CLINIC_OR_DEPARTMENT_OTHER): Payer: Self-pay

## 2021-11-25 ENCOUNTER — Other Ambulatory Visit: Payer: Self-pay | Admitting: Family Medicine

## 2021-11-25 MED ORDER — OZEMPIC (0.25 OR 0.5 MG/DOSE) 2 MG/3ML ~~LOC~~ SOPN
0.5000 mg | PEN_INJECTOR | SUBCUTANEOUS | 3 refills | Status: DC
  Filled 2021-11-25: qty 3, 28d supply, fill #0
  Filled 2021-12-18: qty 3, 28d supply, fill #1
  Filled 2022-01-16: qty 3, 28d supply, fill #2
  Filled 2022-02-13: qty 3, 28d supply, fill #3

## 2021-12-10 LAB — HM DIABETES EYE EXAM

## 2021-12-18 ENCOUNTER — Other Ambulatory Visit (HOSPITAL_BASED_OUTPATIENT_CLINIC_OR_DEPARTMENT_OTHER): Payer: Self-pay

## 2022-01-16 ENCOUNTER — Other Ambulatory Visit (HOSPITAL_BASED_OUTPATIENT_CLINIC_OR_DEPARTMENT_OTHER): Payer: Self-pay

## 2022-01-25 ENCOUNTER — Other Ambulatory Visit: Payer: Self-pay | Admitting: Family Medicine

## 2022-01-27 ENCOUNTER — Other Ambulatory Visit (HOSPITAL_BASED_OUTPATIENT_CLINIC_OR_DEPARTMENT_OTHER): Payer: Self-pay

## 2022-01-27 MED ORDER — TRIAMTERENE-HCTZ 37.5-25 MG PO TABS
1.0000 | ORAL_TABLET | Freq: Every day | ORAL | 1 refills | Status: DC
  Filled 2022-01-27: qty 90, 90d supply, fill #0
  Filled 2022-04-27: qty 30, 30d supply, fill #1
  Filled 2022-05-25: qty 30, 30d supply, fill #2
  Filled 2022-06-23 (×2): qty 30, 30d supply, fill #3

## 2022-02-13 ENCOUNTER — Other Ambulatory Visit (HOSPITAL_BASED_OUTPATIENT_CLINIC_OR_DEPARTMENT_OTHER): Payer: Self-pay

## 2022-03-05 NOTE — Assessment & Plan Note (Signed)
Hydrate and monitor 

## 2022-03-05 NOTE — Assessment & Plan Note (Signed)
Well controlled, no changes to meds. Encouraged heart healthy diet such as the DASH diet and exercise as tolerated.  °

## 2022-03-05 NOTE — Assessment & Plan Note (Signed)
Referred to [facility] gastroenterology for screening colonoscopy. Patient encouraged to maintain heart healthy diet, regular exercise, adequate sleep. Consider daily probiotics. Take medications as prescribed. Labs ordered and reviewed. Follows with OB/GYN for pap smears, have requested records. MGM was April 2022 repeat in 2023

## 2022-03-05 NOTE — Assessment & Plan Note (Addendum)
Encourage heart healthy diet such as MIND or DASH diet, increase exercise, avoid trans fats, simple carbohydrates and processed foods, consider a krill or fish or flaxseed oil cap daily.  Tolerating Repatha 

## 2022-03-05 NOTE — Assessment & Plan Note (Signed)
hgba1c acceptable, minimize simple carbs. Increase exercise as tolerated.  

## 2022-03-06 ENCOUNTER — Other Ambulatory Visit (HOSPITAL_BASED_OUTPATIENT_CLINIC_OR_DEPARTMENT_OTHER): Payer: Self-pay

## 2022-03-06 ENCOUNTER — Ambulatory Visit (INDEPENDENT_AMBULATORY_CARE_PROVIDER_SITE_OTHER): Payer: No Typology Code available for payment source | Admitting: Family Medicine

## 2022-03-06 VITALS — BP 118/74 | HR 71 | Temp 97.5°F | Resp 16 | Ht 63.0 in | Wt 119.4 lb

## 2022-03-06 DIAGNOSIS — I10 Essential (primary) hypertension: Secondary | ICD-10-CM | POA: Diagnosis not present

## 2022-03-06 DIAGNOSIS — E7800 Pure hypercholesterolemia, unspecified: Secondary | ICD-10-CM

## 2022-03-06 DIAGNOSIS — Z0000 Encounter for general adult medical examination without abnormal findings: Secondary | ICD-10-CM

## 2022-03-06 DIAGNOSIS — Z0001 Encounter for general adult medical examination with abnormal findings: Secondary | ICD-10-CM

## 2022-03-06 DIAGNOSIS — N289 Disorder of kidney and ureter, unspecified: Secondary | ICD-10-CM | POA: Diagnosis not present

## 2022-03-06 DIAGNOSIS — E119 Type 2 diabetes mellitus without complications: Secondary | ICD-10-CM | POA: Diagnosis not present

## 2022-03-06 DIAGNOSIS — M069 Rheumatoid arthritis, unspecified: Secondary | ICD-10-CM

## 2022-03-06 DIAGNOSIS — Z1239 Encounter for other screening for malignant neoplasm of breast: Secondary | ICD-10-CM

## 2022-03-06 NOTE — Progress Notes (Signed)
Subjective:   By signing my name below, I, Kellie Simmering, attest that this documentation has been prepared under the direction and in the presence of [name], MD., 03/06/2022.   Patient ID: Marissa Guzman, female    DOB: 05-08-1965, 56 y.o.   MRN: [id number]  Chief Complaint  Patient presents with   Annual Exam    Annual Exam   HPI Patient is in today for a comprehensive physical exam and follow up on chronic medical concerns. She denies CP/palpitations/SOB/ HA/congestion/fevers/GI or GU c/o.  Dermatology Patient is following with dermatology about any skin concerns.  Family History No changes to the family history.  Past Medical History:  Diagnosis Date   Acute bronchitis 05/16/2012   Allergic state 05/16/2012   Allergy    Constipation 04/16/2014   Diabetes mellitus without complication (HCC)    DM2 (diabetes mellitus, type 2) ([place]) 08/10/2015   Fibromyalgia    GERD (gastroesophageal reflux disease) 06/12/2013   Hip pain, bilateral 08/19/2015   HTN (hypertension)    Hyperglycemia 08/10/2015   Hyperlipidemia    Hypokalemia 11/14/2012   Osteoarthritis    Overweight 08/14/2016   Rheumatoid arthritis ([place])    Seasonal allergies    Past Surgical History:  Procedure Laterality Date   CESAREAN SECTION     HEMORRHOID SURGERY     two   Family History  Problem Relation Age of Onset   Heart disease Father        Deceased   Hypertension Father    Lung cancer Father    Melanoma Father        Eye Removal   Hyperlipidemia Mother    Hypertension Mother    Colon polyps Mother    Arthritis/Rheumatoid Mother    Hyperlipidemia Maternal Grandmother    Diabetes Maternal Grandmother    Dementia Maternal Grandmother    Hypertension Sister        x2   Colon polyps Sister    Lupus Sister    Hyperlipidemia Sister        x2   Colon polyps Sister    Colon cancer Neg Hx    Esophageal cancer Neg Hx    Rectal cancer Neg Hx    Stomach cancer Neg Hx    Social History    Socioeconomic History   Marital status: Married    Spouse name: [name]   Number of children: 1   Years of education: 12   Highest education level: Not on file  Occupational History    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  Tobacco Use   Smoking status: Never   Smokeless tobacco: Never  Vaping Use   Vaping Use: Never used  Substance and Sexual Activity   Alcohol use: No   Drug use: No   Sexual activity: Yes    Comment: lives with husband, no dietary restrictions, 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 Check BS  daily as directed.  DzE11.9 1 each 0   cetirizine (ZYRTEC) 10 MG tablet Take 10 mg by mouth daily as needed.     Estradiol Starter Pack (IMVEXXY STARTER PACK) 10 MCG INST Place vaginally.     Evolocumab (REPATHA SURECLICK) 818 MG/ML SOAJ INJECT 1 DOSE INTO THE SKIN EVERY 14 DAYS 6 mL 3   fluticasone (FLONASE) 50 MCG/ACT nasal spray Place 2 sprays into the nose daily as needed.     metoprolol succinate (TOPROL-XL) 50 MG 24 hr tablet Take 1 & 1/2 tablets (75 mg total) by mouth in the morning AND 1 tablet (50 mg total) every evening with or immediately following a meal. 225 tablet 1   omeprazole (PRILOSEC) 20 MG capsule Take 20 mg by mouth daily.     ONE TOUCH ULTRA TEST test strip   0   ONETOUCH DELICA LANCETS 40R MISC   0   Semaglutide,0.25 or 0.5MG/DOS, (OZEMPIC, 0.25 OR 0.5 MG/DOSE,) 2 MG/3ML SOPN Inject 0.5 mg  into the skin once a week. 3 mL 3   triamterene-hydrochlorothiazide (MAXZIDE-25) 37.5-25 MG tablet TAKE 1 TABLET BY MOUTH DAILY. 90 tablet 1   No facility-administered medications prior to visit.   Allergies  Allergen Reactions   Fenofibrate Other (See Comments)    myalgias   Doxycycline Monohydrate     GI Issues.   Niacin And Related     Extreme flushing despite taking it correctly   Statins Other (See Comments)    Livalo, crestor myalgias   Tetracyclines & Related Other (See Comments)    GI Issues.   Review of Systems  Constitutional:  Negative for chills and fever.  HENT:  Negative for congestion.   Respiratory:  Negative for shortness of breath.   Cardiovascular:  Negative for chest pain and palpitations.  Gastrointestinal:  Negative for abdominal pain, blood in stool, constipation, diarrhea, nausea and vomiting.  Genitourinary:  Negative for dysuria, frequency, hematuria and urgency.  Skin:           Neurological:  Negative for headaches.      Objective:    Physical Exam Constitutional:      General: She is not in acute distress.    Appearance: Normal appearance. She is normal weight. She is not ill-appearing.  HENT:     Head: Normocephalic and atraumatic.     Right Ear: Tympanic membrane, ear canal and external ear normal.     Left Ear: Tympanic membrane, ear canal and external ear normal.     Nose: Nose normal.     Mouth/Throat:     Mouth: Mucous membranes are moist.     Pharynx: Oropharynx is clear.  Eyes:     General:        Right eye: No discharge.        Left eye: No discharge.     Extraocular Movements: Extraocular movements intact.     Right eye: No nystagmus.     Left eye: No nystagmus.     Pupils: Pupils are equal, round, and reactive to light.  Neck:     Vascular: No carotid bruit.  Cardiovascular:     Rate and Rhythm: Normal rate and regular rhythm.     Pulses: Normal pulses.     Heart sounds: Normal heart sounds. No murmur heard.    No  gallop.  Pulmonary:     Effort: Pulmonary effort is normal. No respiratory distress.     Breath sounds: Normal  breath sounds. No wheezing or rales.  Abdominal:     General: Bowel sounds are normal.     Palpations: Abdomen is soft.     Tenderness: There is no abdominal tenderness. There is no guarding.  Musculoskeletal:        General: Normal range of motion.     Cervical back: Normal range of motion.     Right lower leg: No edema.     Left lower leg: No edema.     Comments: Muscle strength 5/5 on upper and lower extremities.   Lymphadenopathy:     Cervical: No cervical adenopathy.  Skin:    General: Skin is warm and dry.  Neurological:     Mental Status: She is alert and oriented to person, place, and time.     Sensory: Sensation is intact.     Motor: Motor function is intact.     Coordination: Coordination is intact.     Deep Tendon Reflexes:     Reflex Scores:      Patellar reflexes are 2+ on the right side and 2+ on the left side. Psychiatric:        Mood and Affect: Mood normal.        Behavior: Behavior normal.        Judgment: Judgment normal.    BP 118/74 (BP Location: Right Arm, Patient Position: Sitting, Cuff Size: Normal)   Pulse 71   Temp (!) 97.5 F (36.4 C) (Oral)   Resp 16   Ht _0  (1.6 m)   Wt 119 lb 6.4 oz (54.2 kg)   SpO2 97%   BMI 21.15 kg/m  Wt Readings from Last 3 Encounters:  03/06/22 119 lb 6.4 oz (54.2 kg)  10/24/21 123 lb 9.6 oz (56.1 kg)  09/05/21 129 lb 9.6 oz (58.8 kg)   Diabetic Foot Exam - Simple   No data filed    Lab Results  Component Value Date   WBC 6.4 03/06/2022   HGB 13.9 03/06/2022   HCT 40.7 03/06/2022   PLT 235.0 03/06/2022   GLUCOSE 82 03/06/2022   CHOL 157 03/06/2022   TRIG 144.0 03/06/2022   HDL 56.30 03/06/2022   LDLDIRECT 88.0 12/10/2018   LDLCALC 72 03/06/2022   ALT 10 03/06/2022   AST 19 03/06/2022   NA 140 03/06/2022   K 4.3 03/06/2022   CL 101 03/06/2022   CREATININE 0.91 03/06/2022   BUN 22  03/06/2022   CO2 30 03/06/2022   TSH 1.40 03/06/2022   HGBA1C 5.7 03/06/2022   MICROALBUR <0.7 03/06/2022   Lab Results  Component Value Date   TSH 1.40 03/06/2022   Lab Results  Component Value Date   WBC 6.4 03/06/2022   HGB 13.9 03/06/2022   HCT 40.7 03/06/2022   MCV 86.1 03/06/2022   PLT 235.0 03/06/2022   Lab Results  Component Value Date   NA 140 03/06/2022   K 4.3 03/06/2022   CO2 30 03/06/2022   GLUCOSE 82 03/06/2022   BUN 22 03/06/2022   CREATININE 0.91 03/06/2022   BILITOT 1.1 03/06/2022   ALKPHOS 73 03/06/2022   AST 19 03/06/2022   ALT 10 03/06/2022   PROT 7.3 03/06/2022   ALBUMIN 4.6 03/06/2022   CALCIUM 10.0 03/06/2022   GFR 70.41 03/06/2022   Lab Results  Component Value Date   CHOL 157 03/06/2022   Lab Results  Component Value Date   HDL 56.30 03/06/2022   Lab Results  Component Value Date   LDLCALC  72 03/06/2022   Lab Results  Component Value Date   TRIG 144.0 03/06/2022   Lab Results  Component Value Date   CHOLHDL 3 03/06/2022   Lab Results  Component Value Date   HGBA1C 5.7 03/06/2022      Assessment & Plan:  Mammogram: Last completed on 07/11/2020 with no mammographic evidence of malignancy. Order placed.  Pap Smear: Last completed on 05/17/2019 with normal results. Repeat in 3-5 years.  Advanced Directives: Provided patient with advanced care planning documents.  Healthy Lifestyle: Encouraged adequate sleep, exercise, heart healthy diet, and hydration.  Immunizations: Encouraged COVID-19, Influenza, and Tetanus (if injured) immunizations.  Labs: Routine blood work will be completed today. Problem List Items Addressed This Visit     Hypertension, benign essential, goal below 140/90    Well controlled, no changes to meds. Encouraged heart healthy diet such as the DASH diet and exercise as tolerated.       Relevant Orders   CBC with Differential/Platelet (Completed)   Comprehensive metabolic panel (Completed)   TSH  (Completed)   Hypercholesterolemia    Encourage heart healthy diet such as MIND or DASH diet, increase exercise, avoid trans fats, simple carbohydrates and processed foods, consider a krill or fish or flaxseed oil cap daily. Tolerating Repatha      Relevant Orders   Lipid panel (Completed)   Preventative health care    Referred to [facility] gastroenterology for screening colonoscopy. Patient encouraged to maintain heart healthy diet, regular exercise, adequate sleep. Consider daily probiotics. Take medications as prescribed. Labs ordered and reviewed. Follows with OB/GYN for pap smears, have requested records. MGM was April 2022 repeat in 2023      DM2 (diabetes mellitus, type 2) (HCC)    hgba1c acceptable, minimize simple carbs. Increase exercise as tolerated.       Relevant Orders   Hemoglobin A1c (Completed)   Urine Microalbumin w/creat. ratio (Completed)   Renal insufficiency - Primary    Hydrate and monitor       Relevant Orders   Comprehensive metabolic panel (Completed)   Rheumatoid arthritis ([place])    She stays active, well hydrated and maintains a low inflammation diet. Is doing well overall      Other Visit Diagnoses     Encounter for screening for malignant neoplasm of breast, unspecified screening modality       Relevant Orders   MM 3D SCREEN BREAST BILATERAL      No orders of the defined types were placed in this encounter.  I, Penni Homans, MD, personally preformed the services described in this documentation.  All medical record entries made by the scribe were at my direction and in my presence.  I have reviewed the chart and discharge instructions (if applicable) and agree that the record reflects my personal performance and is accurate and complete. 03/06/2022  I,Mohammed Iqbal,acting as a scribe for Penni Homans, MD.,have documented all relevant documentation on the behalf of Penni Homans, MD,as directed by  Penni Homans, MD while in the presence of Penni Homans,  MD.  Penni Homans, MD

## 2022-03-06 NOTE — Patient Instructions (Signed)
Preventive Care 56-56 Years Old, Female Preventive care refers to lifestyle choices and visits with your health care provider that can promote health and wellness. Preventive care visits are also called wellness exams. What can I expect for my preventive care visit? Counseling Your health care provider may ask you questions about your: Medical history, including: Past medical problems. Family medical history. Pregnancy history. Current health, including: Menstrual cycle. Method of birth control. Emotional well-being. Home life and relationship well-being. Sexual activity and sexual health. Lifestyle, including: Alcohol, nicotine or tobacco, and drug use. Access to firearms. Diet, exercise, and sleep habits. Work and work environment. Sunscreen use. Safety issues such as seatbelt and bike helmet use. Physical exam Your health care provider will check your: Height and weight. These may be used to calculate your BMI (body mass index). BMI is a measurement that tells if you are at a healthy weight. Waist circumference. This measures the distance around your waistline. This measurement also tells if you are at a healthy weight and may help predict your risk of certain diseases, such as type 2 diabetes and high blood pressure. Heart rate and blood pressure. Body temperature. Skin for abnormal spots. What immunizations do I need?  Vaccines are usually given at various ages, according to a schedule. Your health care provider will recommend vaccines for you based on your age, medical history, and lifestyle or other factors, such as travel or where you work. What tests do I need? Screening Your health care provider may recommend screening tests for certain conditions. This may include: Lipid and cholesterol levels. Diabetes screening. This is done by checking your blood sugar (glucose) after you have not eaten for a while (fasting). Pelvic exam and Pap test. Hepatitis B test. Hepatitis C  test. HIV (human immunodeficiency virus) test. STI (sexually transmitted infection) testing, if you are at risk. Lung cancer screening. Colorectal cancer screening. Mammogram. Talk with your health care provider about when you should start having regular mammograms. This may depend on whether you have a family history of breast cancer. BRCA-related cancer screening. This may be done if you have a family history of breast, ovarian, tubal, or peritoneal cancers. Bone density scan. This is done to screen for osteoporosis. Talk with your health care provider about your test results, treatment options, and if necessary, the need for more tests. Follow these instructions at home: Eating and drinking  Eat a diet that includes fresh fruits and vegetables, whole grains, lean protein, and low-fat dairy products. Take vitamin and mineral supplements as recommended by your health care provider. Do not drink alcohol if: Your health care provider tells you not to drink. You are pregnant, may be pregnant, or are planning to become pregnant. If you drink alcohol: Limit how much you have to 0-1 drink a day. Know how much alcohol is in your drink. In the [place] one drink equals one 12 oz bottle of beer (355 mL), one 5 oz glass of wine (148 mL), or one 1 oz glass of hard liquor (44 mL). Lifestyle Brush your teeth every morning and night with fluoride toothpaste. Floss one time each day. Exercise for at least 30 minutes 5 or more days each week. Do not use any products that contain nicotine or tobacco. These products include cigarettes, chewing tobacco, and vaping devices, such as e-cigarettes. If you need help quitting, ask your health care provider. Do not use drugs. If you are sexually active, practice safe sex. Use a condom or other form of protection to   prevent STIs. If you do not wish to become pregnant, use a form of birth control. If you plan to become pregnant, see your health care provider for a  prepregnancy visit. Take aspirin only as told by your health care provider. Make sure that you understand how much to take and what form to take. Work with your health care provider to find out whether it is safe and beneficial for you to take aspirin daily. Find healthy ways to manage stress, such as: Meditation, yoga, or listening to music. Journaling. Talking to a trusted person. Spending time with friends and family. Minimize exposure to UV radiation to reduce your risk of skin cancer. Safety Always wear your seat belt while driving or riding in a vehicle. Do not drive: If you have been drinking alcohol. Do not ride with someone who has been drinking. When you are tired or distracted. While texting. If you have been using any mind-altering substances or drugs. Wear a helmet and other protective equipment during sports activities. If you have firearms in your house, make sure you follow all gun safety procedures. Seek help if you have been physically or sexually abused. What's next? Visit your health care provider once a year for an annual wellness visit. Ask your health care provider how often you should have your eyes and teeth checked. Stay up to date on all vaccines. This information is not intended to replace advice given to you by your health care provider. Make sure you discuss any questions you have with your health care provider. Document Revised: 08/29/2020 Document Reviewed: 08/29/2020 Elsevier Patient Education  [place].

## 2022-03-07 ENCOUNTER — Other Ambulatory Visit: Payer: No Typology Code available for payment source

## 2022-03-07 LAB — CBC WITH DIFFERENTIAL/PLATELET
Basophils Absolute: 0.1 10*3/uL (ref 0.0–0.1)
Basophils Relative: 0.9 % (ref 0.0–3.0)
Eosinophils Absolute: 0.1 10*3/uL (ref 0.0–0.7)
Eosinophils Relative: 1.4 % (ref 0.0–5.0)
HCT: 40.7 % (ref 36.0–46.0)
Hemoglobin: 13.9 g/dL (ref 12.0–15.0)
Lymphocytes Relative: 43.7 % (ref 12.0–46.0)
Lymphs Abs: 2.8 10*3/uL (ref 0.7–4.0)
MCHC: 34.2 g/dL (ref 30.0–36.0)
MCV: 86.1 fl (ref 78.0–100.0)
Monocytes Absolute: 0.5 10*3/uL (ref 0.1–1.0)
Monocytes Relative: 8.5 % (ref 3.0–12.0)
Neutro Abs: 2.9 10*3/uL (ref 1.4–7.7)
Neutrophils Relative %: 45.5 % (ref 43.0–77.0)
Platelets: 235 10*3/uL (ref 150.0–400.0)
RBC: 4.72 Mil/uL (ref 3.87–5.11)
RDW: 12.9 % (ref 11.5–15.5)
WBC: 6.4 10*3/uL (ref 4.0–10.5)

## 2022-03-07 LAB — LIPID PANEL
Cholesterol: 157 mg/dL (ref 0–200)
HDL: 56.3 mg/dL (ref >39.00)
LDL Cholesterol: 72 mg/dL (ref 0–99)
NonHDL: 101.05
Total CHOL/HDL Ratio: 3
Triglycerides: 144 mg/dL (ref 0.0–149.0)
VLDL: 28.8 mg/dL (ref 0.0–40.0)

## 2022-03-07 LAB — COMPREHENSIVE METABOLIC PANEL
ALT: 10 U/L (ref 0–35)
AST: 19 U/L (ref 0–37)
Albumin: 4.6 g/dL (ref 3.5–5.2)
Alkaline Phosphatase: 73 U/L (ref 39–117)
BUN: 22 mg/dL (ref 6–23)
CO2: 30 mEq/L (ref 19–32)
Calcium: 10 mg/dL (ref 8.4–10.5)
Chloride: 101 mEq/L (ref 96–112)
Creatinine, Ser: 0.91 mg/dL (ref 0.40–1.20)
GFR: 70.41 mL/min (ref >60.00)
Glucose, Bld: 82 mg/dL (ref 70–99)
Potassium: 4.3 mEq/L (ref 3.5–5.1)
Sodium: 140 mEq/L (ref 135–145)
Total Bilirubin: 1.1 mg/dL (ref 0.2–1.2)
Total Protein: 7.3 g/dL (ref 6.0–8.3)

## 2022-03-07 LAB — HEMOGLOBIN A1C: Hgb A1c MFr Bld: 5.7 % (ref 4.6–6.5)

## 2022-03-07 LAB — MICROALBUMIN / CREATININE URINE RATIO
Creatinine,U: 153.5 mg/dL
Microalb Creat Ratio: 0.5 mg/g (ref 0.0–30.0)
Microalb, Ur: <0.7 mg/dL (ref 0.0–1.9)

## 2022-03-08 LAB — TSH: TSH: 1.4 u[IU]/mL (ref 0.35–5.50)

## 2022-03-11 NOTE — Assessment & Plan Note (Signed)
She stays active, well hydrated and maintains a low inflammation diet. Is doing well overall

## 2022-03-18 ENCOUNTER — Other Ambulatory Visit (HOSPITAL_BASED_OUTPATIENT_CLINIC_OR_DEPARTMENT_OTHER): Payer: Self-pay

## 2022-03-18 ENCOUNTER — Other Ambulatory Visit: Payer: Self-pay | Admitting: Family Medicine

## 2022-03-18 MED ORDER — OZEMPIC (0.25 OR 0.5 MG/DOSE) 2 MG/3ML ~~LOC~~ SOPN
0.5000 mg | PEN_INJECTOR | SUBCUTANEOUS | 3 refills | Status: DC
  Filled 2022-03-18 – 2022-04-07 (×3): qty 3, 28d supply, fill #0
  Filled 2022-05-14 – 2022-05-17 (×2): qty 3, 28d supply, fill #1
  Filled 2022-06-19 – 2022-10-27 (×4): qty 3, 28d supply, fill #2

## 2022-03-27 ENCOUNTER — Other Ambulatory Visit (HOSPITAL_BASED_OUTPATIENT_CLINIC_OR_DEPARTMENT_OTHER): Payer: Self-pay

## 2022-04-01 ENCOUNTER — Telehealth: Payer: No Typology Code available for payment source

## 2022-04-01 ENCOUNTER — Other Ambulatory Visit (HOSPITAL_BASED_OUTPATIENT_CLINIC_OR_DEPARTMENT_OTHER): Payer: Self-pay

## 2022-04-01 NOTE — Telephone Encounter (Signed)
PA initiated via Covermymeds; KEY: [id number]. Awaiting determination.   Plan is going to require medical records. Will have to appeal.

## 2022-04-02 ENCOUNTER — Other Ambulatory Visit (HOSPITAL_BASED_OUTPATIENT_CLINIC_OR_DEPARTMENT_OTHER): Payer: Self-pay

## 2022-04-02 NOTE — Telephone Encounter (Signed)
Called [employer] at [phone]- informed that all pertinent records and medication name be faxed to ATTN: Case Management at [phone]. Can take 3-5 weeks for determination once all information is received.  

## 2022-04-02 NOTE — Telephone Encounter (Signed)
Records faxed to number below.

## 2022-04-02 NOTE — Telephone Encounter (Signed)
[id number] case has been terminated for Ozempic INJ 2MG /3ML, use as directed (<*# per month/prescription/xx days*>), for the following reason: The requested drug is not managed by the [employer] Prior Authorization department. Please contact [employer] fully-insured plans at [phone]. Reviewed by: Liana Crocker, R.N.

## 2022-04-03 ENCOUNTER — Other Ambulatory Visit (HOSPITAL_BASED_OUTPATIENT_CLINIC_OR_DEPARTMENT_OTHER): Payer: Self-pay

## 2022-04-04 ENCOUNTER — Telehealth: Payer: Self-pay | Admitting: Pharmacist

## 2022-04-04 ENCOUNTER — Other Ambulatory Visit (HOSPITAL_BASED_OUTPATIENT_CLINIC_OR_DEPARTMENT_OTHER): Payer: Self-pay

## 2022-04-04 NOTE — Telephone Encounter (Signed)
Received request from pharmacy for a PA for [place] via [place] (Key: [id number])

## 2022-04-07 ENCOUNTER — Other Ambulatory Visit (HOSPITAL_BASED_OUTPATIENT_CLINIC_OR_DEPARTMENT_OTHER): Payer: Self-pay

## 2022-04-11 ENCOUNTER — Other Ambulatory Visit (HOSPITAL_BASED_OUTPATIENT_CLINIC_OR_DEPARTMENT_OTHER): Payer: Self-pay

## 2022-04-14 ENCOUNTER — Other Ambulatory Visit (HOSPITAL_BASED_OUTPATIENT_CLINIC_OR_DEPARTMENT_OTHER): Payer: Self-pay

## 2022-04-15 ENCOUNTER — Other Ambulatory Visit (HOSPITAL_BASED_OUTPATIENT_CLINIC_OR_DEPARTMENT_OTHER): Payer: Self-pay

## 2022-04-16 ENCOUNTER — Other Ambulatory Visit (HOSPITAL_BASED_OUTPATIENT_CLINIC_OR_DEPARTMENT_OTHER): Payer: Self-pay

## 2022-04-17 ENCOUNTER — Other Ambulatory Visit (HOSPITAL_BASED_OUTPATIENT_CLINIC_OR_DEPARTMENT_OTHER): Payer: Self-pay

## 2022-04-21 ENCOUNTER — Other Ambulatory Visit (HOSPITAL_BASED_OUTPATIENT_CLINIC_OR_DEPARTMENT_OTHER): Payer: Self-pay

## 2022-04-22 ENCOUNTER — Other Ambulatory Visit (HOSPITAL_BASED_OUTPATIENT_CLINIC_OR_DEPARTMENT_OTHER): Payer: Self-pay

## 2022-04-23 ENCOUNTER — Other Ambulatory Visit (HOSPITAL_BASED_OUTPATIENT_CLINIC_OR_DEPARTMENT_OTHER): Payer: Self-pay

## 2022-04-24 ENCOUNTER — Other Ambulatory Visit (HOSPITAL_BASED_OUTPATIENT_CLINIC_OR_DEPARTMENT_OTHER): Payer: Self-pay

## 2022-04-27 ENCOUNTER — Other Ambulatory Visit: Payer: Self-pay | Admitting: Family Medicine

## 2022-04-27 DIAGNOSIS — I10 Essential (primary) hypertension: Secondary | ICD-10-CM

## 2022-04-28 ENCOUNTER — Other Ambulatory Visit (HOSPITAL_BASED_OUTPATIENT_CLINIC_OR_DEPARTMENT_OTHER): Payer: Self-pay

## 2022-04-28 MED ORDER — METOPROLOL SUCCINATE ER 50 MG PO TB24
ORAL_TABLET | ORAL | 1 refills | Status: DC
  Filled 2022-04-28: qty 75, 30d supply, fill #0
  Filled 2022-05-22: qty 75, 30d supply, fill #1
  Filled 2022-06-23 (×2): qty 75, 30d supply, fill #2
  Filled 2022-07-21: qty 75, 30d supply, fill #3
  Filled 2022-10-04: qty 75, 30d supply, fill #4
  Filled 2022-11-03: qty 75, 30d supply, fill #5

## 2022-05-08 ENCOUNTER — Other Ambulatory Visit (HOSPITAL_BASED_OUTPATIENT_CLINIC_OR_DEPARTMENT_OTHER): Payer: Self-pay

## 2022-05-08 NOTE — Telephone Encounter (Signed)
Called [employer] at [phone], spoke w/ Jess- to check status of PA for Ozempic. She informed that PA was received on 04/03/22, determination was made on 04/09/22 and was denied for pre-existing condition. Reference number: [id number]

## 2022-05-12 ENCOUNTER — Other Ambulatory Visit (HOSPITAL_BASED_OUTPATIENT_CLINIC_OR_DEPARTMENT_OTHER): Payer: Self-pay

## 2022-05-14 ENCOUNTER — Other Ambulatory Visit (HOSPITAL_BASED_OUTPATIENT_CLINIC_OR_DEPARTMENT_OTHER): Payer: Self-pay

## 2022-05-14 ENCOUNTER — Other Ambulatory Visit (HOSPITAL_COMMUNITY): Payer: Self-pay

## 2022-05-14 NOTE — Telephone Encounter (Signed)
Called pt and sent mychart  Message let her  know her insurance will not pay for [facility]. To call our office  If she has any question.

## 2022-05-15 ENCOUNTER — Other Ambulatory Visit (HOSPITAL_BASED_OUTPATIENT_CLINIC_OR_DEPARTMENT_OTHER): Payer: Self-pay

## 2022-05-15 NOTE — Telephone Encounter (Signed)
Pt called stating that she had called the insurance company to see if there were any alternatives that they would cover. Insurance refused to give her a list of alternatives and stated that we would have to reach out to them with details on the weight loss to determine which medications were covered under her current plan.

## 2022-05-19 ENCOUNTER — Telehealth: Payer: Self-pay | Admitting: Family Medicine

## 2022-05-19 ENCOUNTER — Other Ambulatory Visit (HOSPITAL_BASED_OUTPATIENT_CLINIC_OR_DEPARTMENT_OTHER): Payer: Self-pay

## 2022-05-19 ENCOUNTER — Encounter: Payer: Self-pay | Admitting: Family Medicine
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
## 2022-05-19 NOTE — Telephone Encounter (Signed)
Pt called stating that she had received a dismissal letter via [place] and did not understand the reason for the dismissal. After determining that Maudie Mercury was unavailable and Estill Bamberg was out of the office, advised pt that a note would be sent back to give her a call back to go over this information. Pt acknowledged understanding.

## 2022-05-19 NOTE — Telephone Encounter (Signed)
Pt called back to see if she could talk to someone about the letter she received because it is stressing her out. Advised that a message was routed to our [place] and she would address it as soon as she is back in the office. Pt was transferred to [employer].

## 2022-05-20 ENCOUNTER — Other Ambulatory Visit (HOSPITAL_BASED_OUTPATIENT_CLINIC_OR_DEPARTMENT_OTHER): Payer: Self-pay
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
## 2022-05-21 ENCOUNTER — Other Ambulatory Visit (HOSPITAL_BASED_OUTPATIENT_CLINIC_OR_DEPARTMENT_OTHER): Payer: Self-pay

## 2022-05-26 ENCOUNTER — Other Ambulatory Visit (HOSPITAL_BASED_OUTPATIENT_CLINIC_OR_DEPARTMENT_OTHER): Payer: Self-pay

## 2022-05-27 ENCOUNTER — Other Ambulatory Visit (HOSPITAL_BASED_OUTPATIENT_CLINIC_OR_DEPARTMENT_OTHER): Payer: Self-pay

## 2022-06-19 ENCOUNTER — Other Ambulatory Visit: Payer: Self-pay

## 2022-06-19 ENCOUNTER — Other Ambulatory Visit (HOSPITAL_BASED_OUTPATIENT_CLINIC_OR_DEPARTMENT_OTHER): Payer: Self-pay

## 2022-06-20 ENCOUNTER — Other Ambulatory Visit (HOSPITAL_BASED_OUTPATIENT_CLINIC_OR_DEPARTMENT_OTHER): Payer: Self-pay

## 2022-06-23 ENCOUNTER — Encounter (HOSPITAL_BASED_OUTPATIENT_CLINIC_OR_DEPARTMENT_OTHER): Payer: Self-pay

## 2022-06-23 ENCOUNTER — Ambulatory Visit (HOSPITAL_BASED_OUTPATIENT_CLINIC_OR_DEPARTMENT_OTHER)
Admission: RE | Admit: 2022-06-23 | Discharge: 2022-06-23 | Disposition: A | Discharge status: Home / Self Care | Payer: BC Managed Care – PPO | Source: Ambulatory Visit | Attending: Family Medicine | Admitting: Family Medicine

## 2022-06-23 ENCOUNTER — Other Ambulatory Visit (HOSPITAL_BASED_OUTPATIENT_CLINIC_OR_DEPARTMENT_OTHER): Payer: Self-pay

## 2022-06-23 ENCOUNTER — Other Ambulatory Visit: Payer: Self-pay

## 2022-06-23 DIAGNOSIS — Z1239 Encounter for other screening for malignant neoplasm of breast: Secondary | ICD-10-CM | POA: Diagnosis not present

## 2022-06-23 DIAGNOSIS — Z1231 Encounter for screening mammogram for malignant neoplasm of breast: Secondary | ICD-10-CM | POA: Diagnosis not present

## 2022-06-24 ENCOUNTER — Other Ambulatory Visit (HOSPITAL_BASED_OUTPATIENT_CLINIC_OR_DEPARTMENT_OTHER): Payer: Self-pay

## 2022-06-26 ENCOUNTER — Other Ambulatory Visit (HOSPITAL_BASED_OUTPATIENT_CLINIC_OR_DEPARTMENT_OTHER): Payer: Self-pay

## 2022-06-26 ENCOUNTER — Telehealth: Payer: Self-pay | Admitting: Internal Medicine

## 2022-06-26 NOTE — Telephone Encounter (Signed)
PA for Repatha submitted via [employer] (Key: [id number])

## 2022-06-27 ENCOUNTER — Other Ambulatory Visit (HOSPITAL_BASED_OUTPATIENT_CLINIC_OR_DEPARTMENT_OTHER): Payer: Self-pay

## 2022-06-30 ENCOUNTER — Other Ambulatory Visit (HOSPITAL_BASED_OUTPATIENT_CLINIC_OR_DEPARTMENT_OTHER): Payer: Self-pay

## 2022-07-01 NOTE — Progress Notes (Signed)
Established patient visit   Patient: Marissa Guzman   DOB: 22-Jun-1965   57 y.o. Female  MRN: [id number] Visit Date: 07/02/2022  Today's healthcare provider: Charlton Amor, DO   Chief Complaint  Patient presents with   Establish Care    Last CPE-03/06/2022 She is not fasting. She has no concerns today.   Diabetes    Foot exam done today.     SUBJECTIVE    Chief Complaint  Patient presents with   Establish Care    Last CPE-03/06/2022 She is not fasting. She has no concerns today.   Diabetes    Foot exam done today.    HPI  Pt presents to establish care. She has a history of diabetes that is controlled with ozempic. Last A1c was   Has pmh of HTN controlled with maxide.   Cologuard done within the last year and was negative 09/2021  Cancers: father-melanoma of the eye; mom-vaginal cancers  HTN: mom and dad DM: mom  Review of Systems  Constitutional:  Negative for activity change, fatigue and fever.  Respiratory:  Negative for cough and shortness of breath.   Cardiovascular:  Negative for chest pain.  Gastrointestinal:  Negative for abdominal pain.  Genitourinary:  Negative for difficulty urinating.       Current Meds  Medication Sig   blood glucose meter kit and supplies KIT Dispense based on patient and insurance preference. Check BS  daily as directed.  DzE11.9   cetirizine (ZYRTEC) 10 MG tablet Take 10 mg by mouth daily as needed.   Estradiol Starter Pack (IMVEXXY STARTER PACK) 10 MCG INST Place vaginally.   Evolocumab (REPATHA SURECLICK) 140 MG/ML SOAJ INJECT 1 DOSE INTO THE SKIN EVERY 14 DAYS   fluticasone (FLONASE) 50 MCG/ACT nasal spray Place 2 sprays into the nose daily as needed.   metoprolol succinate (TOPROL-XL) 50 MG 24 hr tablet Take 1 & 1/2 tablets (75 mg total) by mouth in the morning AND 1 tablet (50 mg total) every evening with or immediately following a meal.   omeprazole (PRILOSEC) 20 MG capsule Take 20 mg by mouth daily.   ONE TOUCH ULTRA  TEST test strip    ONETOUCH DELICA LANCETS 33G MISC    Semaglutide,0.25 or 0.5MG /DOS, (OZEMPIC, 0.25 OR 0.5 MG/DOSE,) 2 MG/3ML SOPN Inject 0.5 mg into the skin once a week.   triamterene-hydrochlorothiazide (MAXZIDE-25) 37.5-25 MG tablet TAKE 1 TABLET BY MOUTH DAILY.    OBJECTIVE    BP 123/80   Pulse 78   Temp 98.5 F (36.9 C) (Oral)   Ht 5\' 3"  (1.6 m)   Wt 121 lb 0.6 oz (54.9 kg)   SpO2 100%   BMI 21.44 kg/m   Physical Exam Vitals and nursing note reviewed.  Constitutional:      General: She is not in acute distress.    Appearance: Normal appearance.  HENT:     Head: Normocephalic and atraumatic.     Right Ear: External ear normal.     Left Ear: External ear normal.     Nose: Nose normal.  Eyes:     Conjunctiva/sclera: Conjunctivae normal.  Cardiovascular:     Rate and Rhythm: Normal rate and regular rhythm.  Pulmonary:     Effort: Pulmonary effort is normal.     Breath sounds: Normal breath sounds.  Neurological:     General: No focal deficit present.     Mental Status: She is alert and oriented to person, place, and time.  Psychiatric:        Mood and Affect: Mood normal.        Behavior: Behavior normal.        Thought Content: Thought content normal.        Judgment: Judgment normal.          ASSESSMENT & PLAN    Problem List Items Addressed This Visit       Cardiovascular and Mediastinum   Primary hypertension - Primary    - continue BP meds  - well controlled - will get labs at next visit        Endocrine   Type 2 diabetes mellitus with hyperglycemia, without long-term current use of insulin    - A1c has been well controlled. She is at 5.7 - she was diagnosed with t2dm in 2016 when her A1c was at 6.5 and has since been controlled - she was on ozempic 0.5mg  daily however with her A1c being low we will do a three month trial off medication and then recheck her A1c.        Return in about 3 months (around 10/01/2022).      No orders of the  defined types were placed in this encounter.   No orders of the defined types were placed in this encounter.    Charlton Amor, DO  [facility] Primary Care & Sports Medicine at [facility] [phone] (phone) [phone] (fax)  [facility]

## 2022-07-02 ENCOUNTER — Encounter: Payer: Self-pay | Admitting: Family Medicine

## 2022-07-02 ENCOUNTER — Ambulatory Visit (INDEPENDENT_AMBULATORY_CARE_PROVIDER_SITE_OTHER): Payer: BC Managed Care – PPO | Admitting: Family Medicine

## 2022-07-02 VITALS — BP 123/80 | HR 78 | Temp 98.5°F | Ht 63.0 in | Wt 121.0 lb

## 2022-07-02 DIAGNOSIS — E1165 Type 2 diabetes mellitus with hyperglycemia: Secondary | ICD-10-CM | POA: Diagnosis not present

## 2022-07-02 DIAGNOSIS — I10 Essential (primary) hypertension: Secondary | ICD-10-CM

## 2022-07-02 NOTE — Assessment & Plan Note (Signed)
-   A1c has been well controlled. She is at 5.7 - she was diagnosed with t2dm in 2016 when her A1c was at 6.5 and has since been controlled - she was on ozempic 0.5mg  daily however with her A1c being low we will do a three month trial off medication and then recheck her A1c.

## 2022-07-02 NOTE — Assessment & Plan Note (Signed)
-   continue BP meds  - well controlled - will get labs at next visit

## 2022-07-21 ENCOUNTER — Other Ambulatory Visit (HOSPITAL_BASED_OUTPATIENT_CLINIC_OR_DEPARTMENT_OTHER): Payer: Self-pay

## 2022-07-21 ENCOUNTER — Other Ambulatory Visit: Payer: Self-pay | Admitting: Family Medicine

## 2022-08-05 ENCOUNTER — Other Ambulatory Visit (HOSPITAL_BASED_OUTPATIENT_CLINIC_OR_DEPARTMENT_OTHER): Payer: Self-pay

## 2022-08-05 DIAGNOSIS — N952 Postmenopausal atrophic vaginitis: Secondary | ICD-10-CM | POA: Diagnosis not present

## 2022-08-05 DIAGNOSIS — Z01411 Encounter for gynecological examination (general) (routine) with abnormal findings: Secondary | ICD-10-CM | POA: Diagnosis not present

## 2022-08-05 MED ORDER — IMVEXXY MAINTENANCE PACK 10 MCG VA INST
10.0000 ug | VAGINAL_INSERT | VAGINAL | 3 refills | Status: DC
  Filled 2022-08-05 – 2022-10-04 (×2): qty 24, 84d supply, fill #0

## 2022-08-07 ENCOUNTER — Other Ambulatory Visit (HOSPITAL_BASED_OUTPATIENT_CLINIC_OR_DEPARTMENT_OTHER): Payer: Self-pay

## 2022-08-08 ENCOUNTER — Other Ambulatory Visit (HOSPITAL_BASED_OUTPATIENT_CLINIC_OR_DEPARTMENT_OTHER): Payer: Self-pay

## 2022-08-12 ENCOUNTER — Other Ambulatory Visit (HOSPITAL_BASED_OUTPATIENT_CLINIC_OR_DEPARTMENT_OTHER): Payer: Self-pay

## 2022-08-14 ENCOUNTER — Other Ambulatory Visit (HOSPITAL_BASED_OUTPATIENT_CLINIC_OR_DEPARTMENT_OTHER): Payer: Self-pay

## 2022-08-18 ENCOUNTER — Other Ambulatory Visit (HOSPITAL_BASED_OUTPATIENT_CLINIC_OR_DEPARTMENT_OTHER): Payer: Self-pay

## 2022-08-19 ENCOUNTER — Telehealth: Payer: Self-pay | Admitting: Family Medicine

## 2022-08-19 MED ORDER — TRIAMTERENE-HCTZ 37.5-25 MG PO TABS: 1.0000 | ORAL_TABLET | Freq: Every day | ORAL | 1 refills | Status: DC

## 2022-08-19 NOTE — Telephone Encounter (Signed)
Pt. Needs refill on triamterene-hydrochlorothiazide (MAXZIDE-25) 37.5-25 MG tablet. Pt is completely out of meds.   Pt would like to use [place].  Please advise.

## 2022-08-21 ENCOUNTER — Other Ambulatory Visit (HOSPITAL_BASED_OUTPATIENT_CLINIC_OR_DEPARTMENT_OTHER): Payer: Self-pay

## 2022-08-21 MED ORDER — TRIAMTERENE-HCTZ 37.5-25 MG PO TABS
1.0000 | ORAL_TABLET | Freq: Every day | ORAL | 1 refills | Status: DC
  Filled 2022-08-21: qty 90, 90d supply, fill #0

## 2022-08-21 MED ORDER — ESTRADIOL 10 MCG VA TABS
10.0000 ug | ORAL_TABLET | VAGINAL | 1 refills | Status: DC
  Filled 2022-08-21: qty 16, 56d supply, fill #0
  Filled 2022-10-15: qty 8, 28d supply, fill #1

## 2022-08-21 NOTE — Telephone Encounter (Signed)
Patient called office and is requesting refill on meds triamterene-hydrochlorothiazide (MAXZIDE-25) 37.5-25 MG tablet. Pt is completely out of meds.    Pt would like to use [facility] [facility].  Please advise.  Patient states she was told that pharmacy has not received refill request.

## 2022-08-21 NOTE — Telephone Encounter (Signed)
Patient advised prescription sent in.

## 2022-09-04 ENCOUNTER — Ambulatory Visit: Payer: No Typology Code available for payment source | Admitting: Family Medicine

## 2022-09-16 ENCOUNTER — Other Ambulatory Visit (HOSPITAL_BASED_OUTPATIENT_CLINIC_OR_DEPARTMENT_OTHER): Payer: Self-pay

## 2022-09-30 NOTE — Progress Notes (Unsigned)
     Established patient visit   Patient: Marissa Guzman   DOB: 04-30-1965   57 y.o. Female  MRN: [id number] Visit Date: 10/01/2022  Today's healthcare provider: Charlton Amor, DO   No chief complaint on file.   SUBJECTIVE   No chief complaint on file.  HPI  Patient presents for follow-up on hypertension and type 2 diabetes.  Hypertension - Currently on Maxide 25mg  - Tolerating it well  Type 2 diabetes -A1c at last visit was 5.7 - Will need updated A1c today  Review of Systems  Constitutional:  Negative for activity change, fatigue and fever.  Respiratory:  Negative for cough and shortness of breath.   Cardiovascular:  Negative for chest pain.  Gastrointestinal:  Negative for abdominal pain.  Genitourinary:  Negative for difficulty urinating.       No outpatient medications have been marked as taking for the 10/01/22 encounter (Appointment) with Charlton Amor, DO.    OBJECTIVE    There were no vitals taken for this visit.  Physical Exam Vitals and nursing note reviewed.  Constitutional:      General: She is not in acute distress.    Appearance: Normal appearance.  HENT:     Head: Normocephalic and atraumatic.     Right Ear: External ear normal.     Left Ear: External ear normal.     Nose: Nose normal.  Eyes:     Conjunctiva/sclera: Conjunctivae normal.  Cardiovascular:     Rate and Rhythm: Normal rate and regular rhythm.  Pulmonary:     Effort: Pulmonary effort is normal.     Breath sounds: Normal breath sounds.  Neurological:     General: No focal deficit present.     Mental Status: She is alert and oriented to person, place, and time.  Psychiatric:        Mood and Affect: Mood normal.        Behavior: Behavior normal.        Thought Content: Thought content normal.        Judgment: Judgment normal.      {Show previous labs (optional):23736}    ASSESSMENT & PLAN    Problem List Items Addressed This Visit   None   No follow-ups on file.       No orders of the defined types were placed in this encounter.   No orders of the defined types were placed in this encounter.    Charlton Amor, DO  [facility] Primary Care & Sports Medicine at [facility] [phone] (phone) [phone] (fax)  [facility]

## 2022-10-01 ENCOUNTER — Encounter: Payer: Self-pay | Admitting: Family Medicine

## 2022-10-01 ENCOUNTER — Ambulatory Visit (INDEPENDENT_AMBULATORY_CARE_PROVIDER_SITE_OTHER): Payer: BC Managed Care – PPO | Admitting: Family Medicine

## 2022-10-01 ENCOUNTER — Other Ambulatory Visit (HOSPITAL_BASED_OUTPATIENT_CLINIC_OR_DEPARTMENT_OTHER): Payer: Self-pay

## 2022-10-01 VITALS — BP 119/83 | HR 77 | Resp 18 | Ht 63.0 in | Wt 132.5 lb

## 2022-10-01 DIAGNOSIS — I10 Essential (primary) hypertension: Secondary | ICD-10-CM

## 2022-10-01 DIAGNOSIS — R140 Abdominal distension (gaseous): Secondary | ICD-10-CM | POA: Insufficient documentation

## 2022-10-01 DIAGNOSIS — E1165 Type 2 diabetes mellitus with hyperglycemia: Secondary | ICD-10-CM

## 2022-10-01 LAB — POCT GLYCOSYLATED HEMOGLOBIN (HGB A1C): Hemoglobin A1C: 6.4 % — AB (ref 4.0–5.6)

## 2022-10-01 MED ORDER — OZEMPIC (0.25 OR 0.5 MG/DOSE) 2 MG/3ML ~~LOC~~ SOPN
0.2500 mg | PEN_INJECTOR | SUBCUTANEOUS | 3 refills | Status: DC
Start: 2022-10-01 — End: 2022-10-27
  Filled 2022-10-01: qty 3, 28d supply, fill #0
  Filled 2022-10-24: qty 3, 28d supply, fill #1

## 2022-10-01 MED ORDER — DICYCLOMINE HCL 10 MG PO CAPS
10.0000 mg | ORAL_CAPSULE | Freq: Three times a day (TID) | ORAL | 2 refills | Status: DC
Start: 2022-10-01 — End: 2023-12-04
  Filled 2022-10-01: qty 23, 6d supply, fill #0
  Filled 2022-10-01: qty 7, 2d supply, fill #0
  Filled 2022-10-04 – 2022-10-13 (×2): qty 30, 8d supply, fill #1
  Filled 2022-10-20: qty 30, 8d supply, fill #2

## 2022-10-01 NOTE — Assessment & Plan Note (Signed)
Patient has been experiencing some bloating lately after meals.  Will go ahead and do a trial of Bentyl.

## 2022-10-01 NOTE — Assessment & Plan Note (Signed)
-   A1c increased today at 6.4 - due to increase will need to go back on ozempic will start at 0.25mg  and then 0.5mg   - follow up in 3 months -Discussed with patient that if she gets nausea we can go ahead and send her something in.  She will let us know

## 2022-10-01 NOTE — Assessment & Plan Note (Signed)
-   pt doing well from HTN standpoint  - will get BMP to check on elyte function - continue maxide

## 2022-10-01 NOTE — Patient Instructions (Signed)
Start back on ozempic 0.25mg  weekly for one month  Then switch to 0.5mg 

## 2022-10-02 LAB — COMPLETE METABOLIC PANEL WITH GFR
AG Ratio: 1.7 (calc) (ref 1.0–2.5)
ALT: 18 U/L (ref 6–29)
AST: 26 U/L (ref 10–35)
Albumin: 4.4 g/dL (ref 3.6–5.1)
Alkaline phosphatase (APISO): 83 U/L (ref 37–153)
BUN: 13 mg/dL (ref 7–25)
CO2: 30 mmol/L (ref 20–32)
Calcium: 10 mg/dL (ref 8.6–10.4)
Chloride: 103 mmol/L (ref 98–110)
Creat: 0.77 mg/dL (ref 0.50–1.03)
Globulin: 2.6 g/dL (calc) (ref 1.9–3.7)
Glucose, Bld: 117 mg/dL — ABNORMAL HIGH (ref 65–99)
Potassium: 4.6 mmol/L (ref 3.5–5.3)
Sodium: 141 mmol/L (ref 135–146)
Total Bilirubin: 0.7 mg/dL (ref 0.2–1.2)
Total Protein: 7 g/dL (ref 6.1–8.1)
eGFR: 90 mL/min/{1.73_m2} (ref >=60)

## 2022-10-06 ENCOUNTER — Other Ambulatory Visit (HOSPITAL_BASED_OUTPATIENT_CLINIC_OR_DEPARTMENT_OTHER): Payer: Self-pay

## 2022-10-07 ENCOUNTER — Other Ambulatory Visit (HOSPITAL_BASED_OUTPATIENT_CLINIC_OR_DEPARTMENT_OTHER): Payer: Self-pay

## 2022-10-10 ENCOUNTER — Other Ambulatory Visit (HOSPITAL_BASED_OUTPATIENT_CLINIC_OR_DEPARTMENT_OTHER): Payer: Self-pay

## 2022-10-13 ENCOUNTER — Other Ambulatory Visit (HOSPITAL_BASED_OUTPATIENT_CLINIC_OR_DEPARTMENT_OTHER): Payer: Self-pay

## 2022-10-14 ENCOUNTER — Other Ambulatory Visit (HOSPITAL_BASED_OUTPATIENT_CLINIC_OR_DEPARTMENT_OTHER): Payer: Self-pay

## 2022-10-15 ENCOUNTER — Other Ambulatory Visit (HOSPITAL_BASED_OUTPATIENT_CLINIC_OR_DEPARTMENT_OTHER): Payer: Self-pay

## 2022-10-16 ENCOUNTER — Other Ambulatory Visit (HOSPITAL_BASED_OUTPATIENT_CLINIC_OR_DEPARTMENT_OTHER): Payer: Self-pay

## 2022-10-20 ENCOUNTER — Other Ambulatory Visit (HOSPITAL_BASED_OUTPATIENT_CLINIC_OR_DEPARTMENT_OTHER): Payer: Self-pay

## 2022-10-20 DIAGNOSIS — L821 Other seborrheic keratosis: Secondary | ICD-10-CM | POA: Diagnosis not present

## 2022-10-20 DIAGNOSIS — L814 Other melanin hyperpigmentation: Secondary | ICD-10-CM | POA: Diagnosis not present

## 2022-10-20 DIAGNOSIS — D225 Melanocytic nevi of trunk: Secondary | ICD-10-CM | POA: Diagnosis not present

## 2022-10-27 ENCOUNTER — Other Ambulatory Visit (HOSPITAL_BASED_OUTPATIENT_CLINIC_OR_DEPARTMENT_OTHER): Payer: Self-pay

## 2022-10-27 ENCOUNTER — Other Ambulatory Visit: Payer: Self-pay | Admitting: Family Medicine

## 2022-10-27 MED ORDER — OZEMPIC (0.25 OR 0.5 MG/DOSE) 2 MG/3ML ~~LOC~~ SOPN
0.5000 mg | PEN_INJECTOR | SUBCUTANEOUS | 1 refills | Status: DC
Start: 2022-10-27 — End: 2023-01-01
  Filled 2022-10-27 – 2022-10-29 (×3): qty 3, 28d supply, fill #0

## 2022-10-28 ENCOUNTER — Other Ambulatory Visit (HOSPITAL_BASED_OUTPATIENT_CLINIC_OR_DEPARTMENT_OTHER): Payer: Self-pay

## 2022-10-29 ENCOUNTER — Telehealth: Payer: Self-pay | Admitting: Family Medicine

## 2022-10-29 ENCOUNTER — Other Ambulatory Visit (HOSPITAL_BASED_OUTPATIENT_CLINIC_OR_DEPARTMENT_OTHER): Payer: Self-pay

## 2022-10-29 NOTE — Telephone Encounter (Signed)
Patient called in stated that her pharmacy needed clarification on her dosage for Ozempic. States that she is supposed be on .5mg  since she has finished her first set. Please advise

## 2022-10-30 ENCOUNTER — Other Ambulatory Visit (HOSPITAL_BASED_OUTPATIENT_CLINIC_OR_DEPARTMENT_OTHER): Payer: Self-pay

## 2022-10-30 NOTE — Telephone Encounter (Signed)
Spoke the pt and she states this has been resolved and she doesn't need anything right now. Roselyn Reef, CMA

## 2022-11-06 ENCOUNTER — Other Ambulatory Visit (HOSPITAL_BASED_OUTPATIENT_CLINIC_OR_DEPARTMENT_OTHER): Payer: Self-pay

## 2022-11-24 ENCOUNTER — Other Ambulatory Visit (HOSPITAL_BASED_OUTPATIENT_CLINIC_OR_DEPARTMENT_OTHER): Payer: Self-pay

## 2022-11-24 ENCOUNTER — Encounter: Payer: Self-pay | Admitting: Pharmacist

## 2022-11-24 ENCOUNTER — Other Ambulatory Visit: Payer: Self-pay | Admitting: Internal Medicine

## 2022-11-24 MED ORDER — REPATHA SURECLICK 140 MG/ML ~~LOC~~ SOAJ
140.0000 mg | SUBCUTANEOUS | 0 refills | Status: DC
  Filled 2022-11-24: qty 2, 28d supply, fill #0

## 2022-11-25 NOTE — Progress Notes (Unsigned)
Cardiology Office Note:  .   Date:  11/26/2022  ID:  Marissa Guzman, DOB 1966-02-06, MRN [id number] PCP: Charlton Amor, DO  [facility] Providers Cardiologist:  None    Patient Profile: .      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 Rennis Golden for management of dyslipidemia.  Seen in the past by Dr. Tomie China.  History of apparent carotid bruits in the past with normal Dopplers.  History of heart disease in the family including both of her parents but particularly more in her mother side including her mother, maternal grandmother, and sisters.  She also reported significant elevations in cholesterol for her mother and sisters.  At the time of initial visit, LDL was 186, non-HDL was 240.  Triglycerides had been as high as 600 but had improved to 210 at that time.  She did not tolerate atorvastatin, rosuvastatin, [place]aba statin, fenofibrate, ezetimibe, Vascepa and niacin due to myalgias. Repeat lipid panel revealed LDL of 328. Dr. Rennis Golden felt this was highly suggestive of familial hyperlipidemia and she underwent genetic testing which revealed a confirmed mutation of PCSK9 namely the D129N variant.  She was placed on Repatha and LDL improved to 77.  Coronary calcium score July 2020 revealed CAC of 22.  Last clinic visit was 10/24/21 with Dr. Rennis Golden.  She continued to tolerate Repatha with no side effects.  LDL plateaued in the 90s with most recent lab work at that time revealing LDL 91, HDL 56, triglycerides 142.  She had previously achieved LDL below 70.  She recently lost weight and had started on Ozempic July 2023.       History of Present Illness: .   Marissa Guzman is a very pleasant 57 y.o. female who is here today for follow- up of dyslipidemia. She is feeling well. Admits she  is not exercising regularly, keeps thinking she will be motivated to start but has not done so. Changed PCP. Was on Ozempic, went off for 3 months and A1C went up so she was put back on. Prior to starting Ozempic, was on Victoza first and had significant weight loss with that medication. Gained weight while off Ozempic for a few months but while taking it, weight is stable. Lengthy discussion about diet. She drinks one soda daily, diet or regular. Generally tries to eat healthy and avoids sugar.  She denies concerning cardiac symptoms.  No problems with medications.  ROS: See HPI       Studies Reviewed: .        Risk Assessment/Calculations:             Physical Exam:   VS:  BP 128/80   Pulse 62   Ht 5\' 3"  (1.6 m)   Wt 129 lb (58.5 kg)   BMI 22.85 kg/m    Wt Readings from Last 3 Encounters:  11/26/22 129 lb (58.5 kg)  10/01/22 132 lb 8 oz (60.1 kg)  07/02/22 121 lb 0.6 oz (54.9 kg)    GEN: Well nourished, well developed in no acute distress CARDIAC: RRR RESPIRATORY:  No increased work of breathing, no wheezing  EXTREMITIES:  No edema; No deformity     ASSESSMENT AND PLAN: .    HeFH: PCSK9 mutation confirmed through genetic testing. LDL 72 on 03/06/22.  She is tolerating Repatha without  any concerning symptoms.  We will recheck NMR today and additionally get apolipoprotein B and LP(a) as I do not see that we have obtained these previously. Continue Repatha.   Elevated CAC score: Coronary calciums core 22 (89th percentile) on CT calcium score on 09/2018. She denies chest pain, dyspnea, or other symptoms concerning for angina.  No indication for further ischemic evaluation at this time.  Secondary prevention emphasized including heart healthy diet low in saturated fat, simple carbohydrates, sugar, and processed foods. Aim to get 150 minutes of moderate intensity exercise each week.        Dispo: 1 year with Dr. Rennis Golden or me  Signed, Eligha Bridegroom, NP-C

## 2022-11-26 ENCOUNTER — Ambulatory Visit (INDEPENDENT_AMBULATORY_CARE_PROVIDER_SITE_OTHER): Payer: BC Managed Care – PPO | Admitting: Nurse Practitioner

## 2022-11-26 ENCOUNTER — Encounter (HOSPITAL_BASED_OUTPATIENT_CLINIC_OR_DEPARTMENT_OTHER): Payer: Self-pay | Admitting: Nurse Practitioner

## 2022-11-26 VITALS — BP 128/80 | HR 62 | Ht 63.0 in | Wt 129.0 lb

## 2022-11-26 DIAGNOSIS — I2584 Coronary atherosclerosis due to calcified coronary lesion: Secondary | ICD-10-CM | POA: Diagnosis not present

## 2022-11-26 DIAGNOSIS — Z789 Other specified health status: Secondary | ICD-10-CM

## 2022-11-26 DIAGNOSIS — E7849 Other hyperlipidemia: Secondary | ICD-10-CM | POA: Diagnosis not present

## 2022-11-26 DIAGNOSIS — I2510 Atherosclerotic heart disease of native coronary artery without angina pectoris: Secondary | ICD-10-CM

## 2022-11-26 NOTE — Patient Instructions (Signed)
Medication Instructions:   Your physician recommends that you continue on your current medications as directed. Please refer to the Current Medication list given to you today.   *If you need a refill on your cardiac medications before your next appointment, please call your pharmacy*   Lab Work:  TODAY!!!! NMR/APOLIPOPROTEIN/LPA  If you have labs (blood work) drawn today and your tests are completely normal, you will receive your results only by: MyChart Message (if you have MyChart) OR A paper copy in the mail If you have any lab test that is abnormal or we need to change your treatment, we will call you to review the results.   Testing/Procedures:  None ordered.   Follow-Up: At [facility], you and your health needs are our priority.  As part of our continuing mission to provide you with exceptional heart care, we have created designated Provider Care Teams.  These Care Teams include your primary Cardiologist (physician) and Advanced Practice Providers (APPs -  Physician Assistants and Nurse Practitioners) who all work together to provide you with the care you need, when you need it.  We recommend signing up for the patient portal called "MyChart".  Sign up information is provided on this After Visit Summary.  MyChart is used to connect with patients for Virtual Visits (Telemedicine).  Patients are able to view lab/test results, encounter notes, upcoming appointments, etc.  Non-urgent messages can be sent to your provider as well.   To learn more about what you can do with MyChart, go to [email or web address].    Your next appointment:   1 year(s)  Provider:   K. Italy Hilty, MD or Eligha Bridegroom, NP.   Other Instructions  Your physician wants you to follow-up in: 1 year. You will receive a reminder letter in the mail two months in advance. If you don't receive a letter, please call our office to schedule the follow-up appointment.  Adopting a Healthy  Lifestyle.   Weight: Know what a healthy weight is for you (roughly BMI <25) and aim to maintain this. You can calculate your body mass index on your smart phone. Unfortunately, this is not the most accurate measure of healthy weight, but it is the simplest measurement to use. A more accurate measurement involves body scanning which measures lean muscle, fat tissue and bony density. We do not have this equipment at [facility].    Diet: Aim for 7+ servings of fruits and vegetables daily Limit animal fats in diet for cholesterol and heart health - choose grass fed whenever available Avoid highly processed foods (fast food burgers, tacos, fried chicken, pizza, hot dogs, french fries)  Saturated fat comes in the form of butter, lard, coconut oil, margarine, partially hydrogenated oils, and fat in meat. These increase your risk of cardiovascular disease.  Use healthy plant oils, such as olive, canola, soy, corn, sunflower and peanut.  Whole foods such as fruits, vegetables and whole grains have fiber  Men need > 38 grams of fiber per day Women need > 25 grams of fiber per day  Load up on vegetables and fruits - one-half of your plate: Aim for color and variety, and remember that potatoes dont count. Go for whole grains - one-quarter of your plate: Whole wheat, barley, wheat berries, quinoa, oats, brown rice, and foods made with them. If you want pasta, go with whole wheat pasta. Protein power - one-quarter of your plate: Fish, chicken, beans, and nuts are all healthy, versatile protein sources. Limit red meat. You  need carbohydrates for energy! The type of carbohydrate is more important than the amount. Choose carbohydrates such as vegetables, fruits, whole grains, beans, and nuts in the place of white rice, white pasta, potatoes (baked or fried), macaroni and cheese, cakes, cookies, and donuts.  If youre thirsty, drink water. Coffee and tea are good in moderation, but skip sugary drinks and limit milk  and dairy products to one or two daily servings. Keep sugar intake at 6 teaspoons or 24 grams or LESS       Exercise: Aim for 150 min of moderate intensity exercise weekly for heart health, and weights twice weekly for bone health Stay active - any steps are better than no steps! Aim for 7-9 hours of sleep daily

## 2022-11-27 LAB — APOLIPOPROTEIN B: Apolipoprotein B: 84 mg/dL (ref <90)

## 2022-11-27 LAB — NMR, LIPOPROFILE
Cholesterol, Total: 169 mg/dL (ref 100–199)
HDL Particle Number: 40.4 umol/L (ref >=30.5)
HDL-C: 51 mg/dL (ref >39)
LDL Particle Number: 939 nmol/L (ref <1000)
LDL Size: 20.3 nm — ABNORMAL LOW (ref >20.5)
LDL-C (NIH Calc): 87 mg/dL (ref 0–99)
LP-IR Score: 65 — ABNORMAL HIGH (ref <=45)
Small LDL Particle Number: 458 nmol/L (ref <=527)
Triglycerides: 184 mg/dL — ABNORMAL HIGH (ref 0–149)

## 2022-11-27 LAB — LIPOPROTEIN A (LPA): Lipoprotein (a): 177.6 nmol/L — ABNORMAL HIGH (ref <75.0)

## 2022-12-01 ENCOUNTER — Other Ambulatory Visit: Payer: Self-pay

## 2022-12-01 ENCOUNTER — Telehealth: Payer: Self-pay | Admitting: Internal Medicine

## 2022-12-01 DIAGNOSIS — I10 Essential (primary) hypertension: Secondary | ICD-10-CM

## 2022-12-01 MED ORDER — REPATHA SURECLICK 140 MG/ML ~~LOC~~ SOAJ: 140.0000 mg | SUBCUTANEOUS | 0 refills | Status: DC

## 2022-12-01 MED ORDER — METOPROLOL SUCCINATE ER 50 MG PO TB24
ORAL_TABLET | ORAL | 1 refills | Status: DC
Start: 2022-12-01 — End: 2023-01-01

## 2022-12-01 NOTE — Telephone Encounter (Signed)
Pt c/o medication issue:  1. Name of Medication:   Evolocumab (REPATHA SURECLICK) 140 MG/ML SOAJ   2. How are you currently taking this medication (dosage and times per day)?     3. Are you having a reaction (difficulty breathing--STAT)?   4. What is your medication issue?   Caller Weston Brass) stated patient is transferring this medication to her new pharmacy - [facility] - [place], [place] - [address] and they will need a prescription on file.

## 2022-12-08 ENCOUNTER — Telehealth: Payer: Self-pay | Admitting: Family Medicine

## 2022-12-08 NOTE — Telephone Encounter (Signed)
Pt called. PA required for Ozempic.

## 2022-12-10 ENCOUNTER — Telehealth: Payer: Self-pay

## 2022-12-10 NOTE — Telephone Encounter (Addendum)
Initiated Prior authorization [id number]:[id number] (0.25 or 0.5 MG/DOSE) 2MG /3ML pen-injectors Via: Covermymeds Case/Key:[id number] Status: approved as of 12/10/22 Reason: Authorization Expiration Date: December 11, 2023 Notified Pt via: Clinical cytogeneticist

## 2022-12-23 DIAGNOSIS — E119 Type 2 diabetes mellitus without complications: Secondary | ICD-10-CM | POA: Diagnosis not present

## 2023-01-01 ENCOUNTER — Other Ambulatory Visit (HOSPITAL_BASED_OUTPATIENT_CLINIC_OR_DEPARTMENT_OTHER): Payer: Self-pay

## 2023-01-01 ENCOUNTER — Encounter: Payer: Self-pay | Admitting: Family Medicine

## 2023-01-01 ENCOUNTER — Ambulatory Visit: Payer: BC Managed Care – PPO | Admitting: Family Medicine

## 2023-01-01 VITALS — BP 137/87 | HR 68 | Ht 63.0 in | Wt 127.2 lb

## 2023-01-01 DIAGNOSIS — I10 Essential (primary) hypertension: Secondary | ICD-10-CM | POA: Diagnosis not present

## 2023-01-01 DIAGNOSIS — E1165 Type 2 diabetes mellitus with hyperglycemia: Secondary | ICD-10-CM

## 2023-01-01 LAB — POCT GLYCOSYLATED HEMOGLOBIN (HGB A1C): Hemoglobin A1C: 5.8 % — AB (ref 4.0–5.6)

## 2023-01-01 MED ORDER — TRIAMTERENE-HCTZ 37.5-25 MG PO TABS
1.0000 | ORAL_TABLET | Freq: Every day | ORAL | 2 refills | Status: DC
  Filled 2023-01-01: qty 90, 90d supply, fill #0

## 2023-01-01 MED ORDER — OZEMPIC (0.25 OR 0.5 MG/DOSE) 2 MG/3ML ~~LOC~~ SOPN
0.5000 mg | PEN_INJECTOR | SUBCUTANEOUS | 6 refills | Status: DC
Start: 2023-01-01 — End: 2023-01-05
  Filled 2023-01-01 – 2023-01-05 (×2): qty 3, 28d supply, fill #0

## 2023-01-01 MED ORDER — METOPROLOL SUCCINATE ER 50 MG PO TB24
ORAL_TABLET | ORAL | 1 refills | Status: DC
  Filled 2023-01-01: qty 225, 90d supply, fill #0

## 2023-01-01 NOTE — Assessment & Plan Note (Signed)
Doing well bp well controlled today - needs labs at next visit

## 2023-01-01 NOTE — Progress Notes (Signed)
Established patient visit   Patient: Marissa Guzman   DOB: Apr 24, 1965   57 y.o. Female  MRN: [id number] Visit Date: 01/01/2023  Today's healthcare provider: Charlton Amor, DO   Chief Complaint  Patient presents with   Medical Management of Chronic Issues    DM last A1C 6.4    SUBJECTIVE    Chief Complaint  Patient presents with   Medical Management of Chronic Issues    DM last A1C 6.4   HPI HPI     Medical Management of Chronic Issues    Additional comments: DM last A1C 6.4      Last edited by Roselyn Reef, CMA on 01/01/2023  8:41 AM.      HTN - doing well - On maxide 25mg     T2DM - on ozempic 0.5mg  - A1c due today, last one (6.4) - on repatha  Review of Systems  Constitutional:  Negative for activity change, fatigue and fever.  Respiratory:  Negative for cough and shortness of breath.   Cardiovascular:  Negative for chest pain.  Gastrointestinal:  Negative for abdominal pain.  Genitourinary:  Negative for difficulty urinating.       Current Meds  Medication Sig   Semaglutide,0.25 or 0.5MG /DOS, (OZEMPIC, 0.25 OR 0.5 MG/DOSE,) 2 MG/3ML SOPN Inject 0.5 mg into the skin once a week.    OBJECTIVE    BP 137/87   Pulse 68   Ht 5\' 3"  (1.6 m)   Wt 127 lb 4 oz (57.7 kg)   SpO2 99%   BMI 22.54 kg/m   Physical Exam Vitals and nursing note reviewed.  Constitutional:      General: She is not in acute distress.    Appearance: Normal appearance.  HENT:     Head: Normocephalic and atraumatic.     Right Ear: External ear normal.     Left Ear: External ear normal.     Nose: Nose normal.  Eyes:     Conjunctiva/sclera: Conjunctivae normal.  Cardiovascular:     Rate and Rhythm: Normal rate and regular rhythm.  Pulmonary:     Effort: Pulmonary effort is normal.     Breath sounds: Normal breath sounds.  Neurological:     General: No focal deficit present.     Mental Status: She is alert and oriented to person, place, and time.  Psychiatric:         Mood and Affect: Mood normal.        Behavior: Behavior normal.        Thought Content: Thought content normal.        Judgment: Judgment normal.        ASSESSMENT & PLAN    Problem List Items Addressed This Visit       Cardiovascular and Mediastinum   Primary hypertension    Doing well bp well controlled today - needs labs at next visit      Relevant Medications   triamterene-hydrochlorothiazide (MAXZIDE-25) 37.5-25 MG tablet   metoprolol succinate (TOPROL-XL) 50 MG 24 hr tablet     Endocrine   Type 2 diabetes mellitus with hyperglycemia, without long-term current use of insulin (HCC) - Primary    - poca1c done in clinic and is down to 5.8, congratulated patient on the good work - will refill ozempic 0.5mg  - follow up 7 months  - will get blood work at that visit  - has had diabetic eye exam done at [facility], we will request records  -  discussed pna vaccine due to her hx of DM and pt would like to hold off at this time.  - on repatha for HLD control      Relevant Medications   Semaglutide,0.25 or 0.5MG /DOS, (OZEMPIC, 0.25 OR 0.5 MG/DOSE,) 2 MG/3ML SOPN   Other Relevant Orders   POCT HgB A1C   Blood work at next visit  Return in about 7 months (around 08/10/2023).      Meds ordered this encounter  Medications   triamterene-hydrochlorothiazide (MAXZIDE-25) 37.5-25 MG tablet    Sig: Take 1 tablet by mouth daily.    Dispense:  90 tablet    Refill:  2   metoprolol succinate (TOPROL-XL) 50 MG 24 hr tablet    Sig: Take 1 & 1/2 tablets (75 mg total) by mouth in the morning AND 1 tablet (50 mg total) every evening with or immediately following a meal.    Dispense:  225 tablet    Refill:  1   Semaglutide,0.25 or 0.5MG /DOS, (OZEMPIC, 0.25 OR 0.5 MG/DOSE,) 2 MG/3ML SOPN    Sig: Inject 0.5 mg into the skin once a week.    Dispense:  3 mL    Refill:  6    Orders Placed This Encounter  Procedures   POCT HgB A1C     Charlton Amor, DO  [facility]  Primary Care & Sports Medicine at [facility] [phone] (phone) [phone] (fax)  [facility]

## 2023-01-01 NOTE — Assessment & Plan Note (Addendum)
-   poca1c done in clinic and is down to 5.8, congratulated patient on the good work - will refill ozempic 0.5mg  - follow up 7 months  - will get blood work at that visit  - has had diabetic eye exam done at [facility], we will request records  - discussed pna vaccine due to her hx of DM and pt would like to hold off at this time.  - on repatha for HLD control

## 2023-01-05 ENCOUNTER — Other Ambulatory Visit (HOSPITAL_BASED_OUTPATIENT_CLINIC_OR_DEPARTMENT_OTHER): Payer: Self-pay

## 2023-01-05 ENCOUNTER — Other Ambulatory Visit: Payer: Self-pay

## 2023-01-05 ENCOUNTER — Telehealth: Payer: Self-pay | Admitting: Family Medicine

## 2023-01-05 DIAGNOSIS — I10 Essential (primary) hypertension: Secondary | ICD-10-CM

## 2023-01-05 DIAGNOSIS — E1165 Type 2 diabetes mellitus with hyperglycemia: Secondary | ICD-10-CM

## 2023-01-05 MED ORDER — METOPROLOL SUCCINATE ER 50 MG PO TB24: ORAL_TABLET | ORAL | 1 refills | Status: DC

## 2023-01-05 MED ORDER — TRIAMTERENE-HCTZ 37.5-25 MG PO TABS: 1.0000 | ORAL_TABLET | Freq: Every day | ORAL | 2 refills | Status: DC

## 2023-01-05 MED ORDER — OZEMPIC (0.25 OR 0.5 MG/DOSE) 2 MG/3ML ~~LOC~~ SOPN: 0.5000 mg | PEN_INJECTOR | SUBCUTANEOUS | 6 refills | Status: DC

## 2023-01-05 NOTE — Telephone Encounter (Signed)
Patient is asking to change pharmacies [facility] the correct pharmacy should be [facility] number [phone] Please resubmit to [facility] Maxide 37.5 mg Metoprolol Succinate xl 50mg  Ozempic 0.5mg 

## 2023-03-03 ENCOUNTER — Encounter: Payer: Self-pay | Admitting: Family Medicine

## 2023-03-03 NOTE — Telephone Encounter (Signed)
 Care team updated and letter sent for eye exam notes.

## 2023-03-23 ENCOUNTER — Ambulatory Visit: Payer: BC Managed Care – PPO | Admitting: Internal Medicine

## 2023-03-30 ENCOUNTER — Other Ambulatory Visit: Payer: Self-pay | Admitting: Internal Medicine

## 2023-03-30 DIAGNOSIS — E7849 Other hyperlipidemia: Secondary | ICD-10-CM

## 2023-03-30 DIAGNOSIS — I2510 Atherosclerotic heart disease of native coronary artery without angina pectoris: Secondary | ICD-10-CM

## 2023-06-29 ENCOUNTER — Telehealth: Payer: Self-pay | Admitting: Pharmacy Technician

## 2023-06-29 NOTE — Telephone Encounter (Signed)
 Pharmacy Patient Advocate Encounter   Received notification from CoverMyMeds that prior authorization for repatha is required/requested.   Insurance verification completed.   The patient is insured through [facility] .   Per test claim: PA required; PA submitted to above mentioned insurance via CoverMyMeds Key/confirmation #/EOC [id number] Status is pending

## 2023-06-30 NOTE — Telephone Encounter (Signed)
 Pharmacy Patient Advocate Encounter  Received notification from [facility] that Prior Authorization for repatha has been APPROVED from 06/29/23 to 06/28/24. Spoke to pharmacy to process.Copay is $75.00 for 30 days .    PA #/Case ID/Reference #:  [id number]

## 2023-07-17 ENCOUNTER — Other Ambulatory Visit: Payer: Self-pay | Admitting: Family Medicine

## 2023-07-17 DIAGNOSIS — E1165 Type 2 diabetes mellitus with hyperglycemia: Secondary | ICD-10-CM

## 2023-07-20 DIAGNOSIS — H0288B Meibomian gland dysfunction left eye, upper and lower eyelids: Secondary | ICD-10-CM | POA: Diagnosis not present

## 2023-07-23 ENCOUNTER — Encounter: Payer: Self-pay | Admitting: Family Medicine

## 2023-08-03 ENCOUNTER — Telehealth: Payer: Self-pay

## 2023-08-03 ENCOUNTER — Ambulatory Visit: Admitting: Family Medicine

## 2023-08-03 ENCOUNTER — Encounter: Payer: Self-pay | Admitting: Family Medicine

## 2023-08-03 VITALS — BP 114/78 | HR 83 | Temp 98.7°F | Ht 63.0 in | Wt 120.5 lb

## 2023-08-03 DIAGNOSIS — R209 Unspecified disturbances of skin sensation: Secondary | ICD-10-CM | POA: Insufficient documentation

## 2023-08-03 DIAGNOSIS — Z7985 Long-term (current) use of injectable non-insulin antidiabetic drugs: Secondary | ICD-10-CM | POA: Diagnosis not present

## 2023-08-03 DIAGNOSIS — L814 Other melanin hyperpigmentation: Secondary | ICD-10-CM | POA: Insufficient documentation

## 2023-08-03 DIAGNOSIS — E1165 Type 2 diabetes mellitus with hyperglycemia: Secondary | ICD-10-CM

## 2023-08-03 DIAGNOSIS — I10 Essential (primary) hypertension: Secondary | ICD-10-CM

## 2023-08-03 DIAGNOSIS — D225 Melanocytic nevi of trunk: Secondary | ICD-10-CM | POA: Insufficient documentation

## 2023-08-03 DIAGNOSIS — D1801 Hemangioma of skin and subcutaneous tissue: Secondary | ICD-10-CM | POA: Insufficient documentation

## 2023-08-03 LAB — POCT GLYCOSYLATED HEMOGLOBIN (HGB A1C): Hemoglobin A1C: 5.8 % — AB (ref 4.0–5.6)

## 2023-08-03 NOTE — Telephone Encounter (Signed)
 Patient would like to transfer care to Dr. Courtland Ditch. Please Advise?

## 2023-08-03 NOTE — Progress Notes (Signed)
 Established Patient Office Visit  Subjective   Patient ID: Marissa Guzman, female    DOB: 1965/03/18  Age: 58 y.o. MRN: [id number]  Chief Complaint  Patient presents with   Diabetes    Diabetes    Diabetes Pt is currently taking Ozempic  0.5mg  weekly. She is tolerating this well. She was on Victoza  before but insurance didn't cover.   Hypertension Pt is taking Metoprolol  50mg  1.5 tab in the morning and 1 tab at night. She is also taking Maxzide  daily. Tolerating medicines well.   Review of Systems  All other systems reviewed and are negative.     Objective:     BP 114/78 (Cuff Size: Normal)   Pulse 83   Temp 98.7 F (37.1 C) (Oral)   Ht 5\' 3"  (1.6 m)   Wt 120 lb 8 oz (54.7 kg)   SpO2 100%   BMI 21.35 kg/m  BP Readings from Last 3 Encounters:  08/03/23 114/78  01/01/23 137/87  11/26/22 128/80      Physical Exam Vitals and nursing note reviewed.  Constitutional:      Appearance: Normal appearance. She is normal weight.  HENT:     Head: Normocephalic and atraumatic.     Right Ear: External ear normal.     Left Ear: External ear normal.     Nose: Nose normal.     Mouth/Throat:     Mouth: Mucous membranes are moist.     Pharynx: Oropharynx is clear.  Eyes:     Conjunctiva/sclera: Conjunctivae normal.     Pupils: Pupils are equal, round, and reactive to light.  Cardiovascular:     Rate and Rhythm: Normal rate and regular rhythm.     Pulses: Normal pulses.     Heart sounds: Normal heart sounds.  Pulmonary:     Effort: Pulmonary effort is normal.     Breath sounds: Normal breath sounds.  Abdominal:     General: Abdomen is flat. Bowel sounds are normal.  Skin:    General: Skin is warm.     Capillary Refill: Capillary refill takes less than 2 seconds.  Neurological:     General: No focal deficit present.     Mental Status: She is alert and oriented to person, place, and time. Mental status is at baseline.  Psychiatric:        Mood and Affect: Mood  normal.        Behavior: Behavior normal.        Thought Content: Thought content normal.        Judgment: Judgment normal.      Results for orders placed or performed in visit on 08/03/23  POCT glycosylated hemoglobin (Hb A1C)  Result Value Ref Range   Hemoglobin A1C 5.8 (A) 4.0 - 5.6 %   HbA1c POC (<> result, manual entry)     HbA1c, POC (prediabetic range)     HbA1c, POC (controlled diabetic range)       The 10-year ASCVD risk score (Arnett DK, et al., 2019) is: 4.6%    Assessment & Plan:   Problem List Items Addressed This Visit       Cardiovascular and Mediastinum   Primary hypertension     Endocrine   Type 2 diabetes mellitus with hyperglycemia, without long-term current use of insulin  (HCC) - Primary   Relevant Orders   POCT glycosylated hemoglobin (Hb A1C) (Completed)    Return in about 4 months (around 12/04/2023) for Chronic condition follow up.  Pt with  stable and chronic HTN and DM. A1c 5.8. Blood pressure at goal. Good with all medication refills at this time. See back in 4 months.   Manette Section, MD

## 2023-08-11 ENCOUNTER — Ambulatory Visit: Payer: BC Managed Care – PPO | Admitting: Family Medicine

## 2023-08-13 ENCOUNTER — Other Ambulatory Visit: Payer: Self-pay | Admitting: Family Medicine

## 2023-08-13 DIAGNOSIS — I10 Essential (primary) hypertension: Secondary | ICD-10-CM

## 2023-09-26 ENCOUNTER — Other Ambulatory Visit: Payer: Self-pay | Admitting: Internal Medicine

## 2023-09-26 DIAGNOSIS — E7849 Other hyperlipidemia: Secondary | ICD-10-CM

## 2023-09-26 DIAGNOSIS — I2510 Atherosclerotic heart disease of native coronary artery without angina pectoris: Secondary | ICD-10-CM

## 2023-11-02 DIAGNOSIS — Z1151 Encounter for screening for human papillomavirus (HPV): Secondary | ICD-10-CM | POA: Diagnosis not present

## 2023-11-02 DIAGNOSIS — Z1231 Encounter for screening mammogram for malignant neoplasm of breast: Secondary | ICD-10-CM | POA: Diagnosis not present

## 2023-11-02 DIAGNOSIS — Z01419 Encounter for gynecological examination (general) (routine) without abnormal findings: Secondary | ICD-10-CM | POA: Diagnosis not present

## 2023-11-11 DIAGNOSIS — L578 Other skin changes due to chronic exposure to nonionizing radiation: Secondary | ICD-10-CM | POA: Diagnosis not present

## 2023-11-11 DIAGNOSIS — D225 Melanocytic nevi of trunk: Secondary | ICD-10-CM | POA: Diagnosis not present

## 2023-11-11 DIAGNOSIS — L821 Other seborrheic keratosis: Secondary | ICD-10-CM | POA: Diagnosis not present

## 2023-11-11 DIAGNOSIS — L814 Other melanin hyperpigmentation: Secondary | ICD-10-CM | POA: Diagnosis not present

## 2023-11-25 ENCOUNTER — Other Ambulatory Visit (HOSPITAL_COMMUNITY): Payer: Self-pay

## 2023-11-27 ENCOUNTER — Encounter (HOSPITAL_BASED_OUTPATIENT_CLINIC_OR_DEPARTMENT_OTHER): Payer: Self-pay | Admitting: Internal Medicine

## 2023-12-04 ENCOUNTER — Encounter: Payer: Self-pay | Admitting: Family Medicine

## 2023-12-04 ENCOUNTER — Ambulatory Visit: Admitting: Family Medicine

## 2023-12-04 VITALS — BP 101/64 | HR 80 | Temp 98.0°F | Resp 18 | Ht 63.0 in | Wt 125.7 lb

## 2023-12-04 DIAGNOSIS — I10 Essential (primary) hypertension: Secondary | ICD-10-CM | POA: Diagnosis not present

## 2023-12-04 DIAGNOSIS — Z7985 Long-term (current) use of injectable non-insulin antidiabetic drugs: Secondary | ICD-10-CM

## 2023-12-04 DIAGNOSIS — T466X5A Adverse effect of antihyperlipidemic and antiarteriosclerotic drugs, initial encounter: Secondary | ICD-10-CM | POA: Diagnosis not present

## 2023-12-04 DIAGNOSIS — G720 Drug-induced myopathy: Secondary | ICD-10-CM | POA: Diagnosis not present

## 2023-12-04 DIAGNOSIS — E119 Type 2 diabetes mellitus without complications: Secondary | ICD-10-CM

## 2023-12-04 LAB — POCT GLYCOSYLATED HEMOGLOBIN (HGB A1C): Hemoglobin A1C: 5.9 % — AB (ref 4.0–5.6)

## 2023-12-04 MED ORDER — TRIAMTERENE-HCTZ 37.5-25 MG PO TABS: 1.0000 | ORAL_TABLET | Freq: Every day | ORAL | 1 refills | Status: DC

## 2023-12-04 NOTE — Progress Notes (Signed)
 Established Patient Office Visit  Subjective   Patient ID: Marissa Guzman, female    DOB: 07/17/1965  Age: 58 y.o. MRN: [id number]  Chief Complaint  Patient presents with   Diabetes    Diabetes   Diabetes Pt taking Ozempic  0.5mg  weekly for DM. Tolerating injection and denies abd pain, nausea, or constipation. Has had DM eye exam this year. 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 Seeing cardiology for this.  Hypertension Taking Metoprolol  50mg  1.5 tab in the morning and 1 tab at night. She is also taking Maxzide  37.5/25mg  daily. Tolerating medicine well. Blood pressure at goal. Needs this refilled today.   Review of Systems  All other systems reviewed and are negative.     Objective:     BP 101/64   Pulse 80   Temp 98 F (36.7 C) (Oral)   Resp 18   Ht 5' 3 (1.6 m)   Wt 125 lb 11.2 oz (57 kg)   SpO2 95%   BMI 22.27 kg/m  BP Readings from Last 3 Encounters:  12/04/23 101/64  08/03/23 114/78  01/01/23 137/87      Physical Exam Vitals and nursing note reviewed.  Constitutional:      Appearance: Normal appearance. She is normal weight.  HENT:     Head: Normocephalic and atraumatic.     Right Ear: External ear normal.     Left Ear: External ear normal.     Nose: Nose normal.     Mouth/Throat:     Mouth: Mucous membranes are moist.     Pharynx: Oropharynx is clear.  Eyes:     Conjunctiva/sclera: Conjunctivae normal.     Pupils: Pupils are equal, round, and reactive to light.  Cardiovascular:     Rate and Rhythm: Normal rate and regular rhythm.     Pulses: Normal pulses.     Heart sounds: Normal heart sounds.  Pulmonary:     Effort: Pulmonary effort is normal.     Breath sounds: Normal breath sounds.  Skin:    General: Skin is warm.     Capillary Refill: Capillary refill takes less than 2 seconds.  Neurological:     General: No focal deficit present.     Mental Status: She is alert and oriented to person, place, and time. Mental status is at  baseline.  Psychiatric:        Mood and Affect: Mood normal.        Behavior: Behavior normal.        Thought Content: Thought content normal.        Judgment: Judgment normal.   Diabetic Foot Exam - Simple   Simple Foot Form Diabetic Foot exam was performed with the following findings: Yes 12/04/2023  9:43 AM  Visual Inspection No deformities, no ulcerations, no other skin breakdown bilaterally: Yes Sensation Testing Intact to touch and monofilament testing bilaterally: Yes Pulse Check Posterior Tibialis and Dorsalis pulse intact bilaterally: Yes Comments      Results for orders placed or performed in visit on 12/04/23  POCT glycosylated hemoglobin (Hb A1C)  Result Value Ref Range   Hemoglobin A1C 5.9 (A) 4.0 - 5.6 %   HbA1c POC (<> result, manual entry)     HbA1c, POC (prediabetic range)     HbA1c, POC (controlled diabetic range)      Last hemoglobin A1c Lab Results  Component Value Date   HGBA1C 5.9 (A) 12/04/2023      The 10-year ASCVD risk score (Arnett  DK, et al., 2019) is: 3.7%    Assessment & Plan:   Problem List Items Addressed This Visit   None Visit Diagnoses       Type 2 diabetes mellitus without complication, without long-term current use of insulin  (HCC)    -  Primary   Relevant Orders   POCT glycosylated hemoglobin (Hb A1C) (Completed)     Type 2 diabetes mellitus without complication, without long-term current use of insulin  (HCC) -     POCT glycosylated hemoglobin (Hb A1C)  Primary hypertension -     Triamterene -HCTZ; Take 1 tablet by mouth daily.  Dispense: 90 tablet; Refill: 1  Statin myopathy   Diabetes well controlled. Continue Ozempic  0.5mg  weekly.  BP at goal. Refilled Triamterene /hydrochlorothiazide  37.5/25mg  daily.   No follow-ups on file.    Torrence CINDERELLA Barrier, MD

## 2023-12-11 DIAGNOSIS — E119 Type 2 diabetes mellitus without complications: Secondary | ICD-10-CM | POA: Diagnosis not present

## 2023-12-25 ENCOUNTER — Telehealth: Payer: Self-pay

## 2023-12-25 NOTE — Telephone Encounter (Signed)
 Pharmacy Patient Advocate Encounter   Received notification from CoverMyMeds that prior authorization for Ozempic (0.25 or 0.5 MG/DOSE) 2MG /3ML pen-injectors is due for renewal.   Insurance verification completed.   The patient is insured through [facility]  Action: Medication is now available without a prior authorization.

## 2024-02-16 NOTE — Progress Notes (Unsigned)
 Cardiology Office Note:  .   Date:  02/17/2024  ID:  Marissa Guzman, DOB 04-05-1965, MRN [id number] PCP: Colette Torrence GRADE, MD  [facility] Providers Cardiologist:  None    Patient Profile: .      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 Mona for management of dyslipidemia. Seen in the past by Dr. Edwyna.  History of apparent carotid bruits in the past with normal Dopplers.  History of heart disease in the family including both of her parents but particularly more in her mother side including her mother, maternal grandmother, and sisters.  She also reported significant elevations in cholesterol for her mother and sisters.  At the time of initial visit, LDL was 186, non-HDL was 240. Triglycerides had been as high as 600 but had improved to 210 at that time.  She did not tolerate atorvastatin , rosuvastatin , pitavastatin , fenofibrate, ezetimibe , Vascepa  and niacin  due to myalgias. Repeat lipid panel revealed LDL of 328. Dr. Mona felt this was highly suggestive of familial hyperlipidemia and she underwent genetic testing which revealed a confirmed mutation of PCSK9 namely the D129N variant.  She was placed on Repatha  and LDL improved to 77.  Coronary calcium  score July 2020 revealed CAC of 22.  Return clinic visit 10/24/21 with Dr. Mona.  She continued to tolerate Repatha  with no side effects.  LDL plateaued in the 90s with most recent lab work at that time revealing LDL 91, HDL 56, triglycerides 142.  She had previously achieved LDL below 70.  She recently lost weight and had started on Ozempic  July 2023.  Seen in clinic by me on 11/26/2022. Feeling well, not feeling motivated to start exercising consistently. Changed PCP. Was on Ozempic , went off for 3 months  and A1C went up so she was put back on. Prior to starting Ozempic , was on Victoza  first and had significant weight loss with that medication. Gained weight while off Ozempic  for a few months but while taking it, weight is stable. Lengthy discussion about diet. She drinks one soda daily, diet or regular. Generally tries to eat healthy and avoids sugar.  No concerning cardiac symptoms or problems with medications. NMR on 11/26/22 revealed LDL particle #939, LDL-C 87, HDL-C 51, triglycerides 184, total cholesterol 169, and small LDL-P 458. LP(a) elevated at 177.6. Apo B was 84.  Was advised to continue Repatha  along with lifestyle modification to include healthy diet and to increase physical activity.       History of Present Illness: .   Discussed the use of AI scribe software for clinical note transcription with the patient, who gave verbal consent to proceed.  History of Present Illness Marissa Guzman is a very pleasant 58 year old female who presents for follow-up of dyslipidemia. Reports she is feeling well. She takes metoprolol  twice daily for elevated heart rate. Her heart rate has stabilized after weight loss, however she has continued the medication. She denies palpitations, chest pain, or shortness of breath. She continues Repatha  without concerning side effects. Her last cholesterol test was over a year ago. She has a PCSK9 mutation identified on genetic testing, elevated lipoprotein(a), a CT calcium  score of 22 in 2020, and both parents had heart disease. She walks about two miles at least three times a week without shortness of breath and  feels improved exertional tolerance compared with when she was heavier. She is on Ozempic  for diabetes, which has contributed to weight loss. She maintains a regular eating schedule and is limiting meat intake. Feels diet is overall healthy.   ROS: See HPI       Studies Reviewed: SABRA   EKG Interpretation Date/Time:  Wednesday February 17 2024 14:58:17  EST Ventricular Rate:  75 PR Interval:  174 QRS Duration:  72 QT Interval:  374 QTC Calculation: 417 R Axis:   55  Text Interpretation: Normal sinus rhythm Normal ECG No previous ECGs available Confirmed by Percy Browning [phone]) on 02/17/2024 3:15:06 PM    Risk Assessment/Calculations:             Physical Exam:   VS:  BP 124/76   Pulse 82   Ht 5' 3 (1.6 m)   Wt 123 lb 8 oz (56 kg)   SpO2 99%   BMI 21.88 kg/m    Wt Readings from Last 3 Encounters:  02/17/24 123 lb 8 oz (56 kg)  12/04/23 125 lb 11.2 oz (57 kg)  08/03/23 120 lb 8 oz (54.7 kg)    GEN: Well nourished, well developed in no acute distress CARDIAC: RRR RESPIRATORY:  No increased work of breathing, no wheezing  EXTREMITIES:  No edema; No deformity     ASSESSMENT AND PLAN: .    HeFH Elevated LP(a) Hyperlipidemia LDL goal < 55 Statin intolerance History of familial  hyperlipidemia. PCSK9 mutation confirmed through genetic testing.  LDL improved from 180 to 87 on labs completed 11/26/2022.  Triglycerides have been consistently above 150, although not significantly elevated.  She is due for repeat lipid testing.  She is tolerating Repatha  without any concerning side effects.   - We will recheck NMR when she can return fasting - Continue Repatha  140 mg every 14 days  Coronary calcification Cardiac risk Coronary calcium  score 22 (89th percentile) on CT calcium  score on 09/2018. She denies chest pain, dyspnea, or other symptoms concerning for angina.  No indication for further ischemic evaluation at this time.  - Consider repeat calcium  scoring for evaluation of progression - Report concerning cardiac symptoms - Secondary prevention emphasized including heart healthy diet low in saturated fat, simple carbohydrates, sugar, and processed foods - And aim to get at least 150 minutes of moderate intensity exercise each week.        Dispo: 1 year with Dr. Mona or me  Signed, Browning Percy, NP-C

## 2024-02-17 ENCOUNTER — Ambulatory Visit (HOSPITAL_BASED_OUTPATIENT_CLINIC_OR_DEPARTMENT_OTHER): Admitting: Nurse Practitioner

## 2024-02-17 ENCOUNTER — Encounter (HOSPITAL_BASED_OUTPATIENT_CLINIC_OR_DEPARTMENT_OTHER): Payer: Self-pay | Admitting: Nurse Practitioner

## 2024-02-17 VITALS — BP 124/76 | HR 82 | Ht 63.0 in | Wt 123.5 lb

## 2024-02-17 DIAGNOSIS — Z789 Other specified health status: Secondary | ICD-10-CM

## 2024-02-17 DIAGNOSIS — Z7189 Other specified counseling: Secondary | ICD-10-CM

## 2024-02-17 DIAGNOSIS — E7849 Other hyperlipidemia: Secondary | ICD-10-CM

## 2024-02-17 DIAGNOSIS — I2510 Atherosclerotic heart disease of native coronary artery without angina pectoris: Secondary | ICD-10-CM | POA: Diagnosis not present

## 2024-02-17 DIAGNOSIS — E7841 Elevated Lipoprotein(a): Secondary | ICD-10-CM

## 2024-02-17 NOTE — Patient Instructions (Signed)
 Medication Instructions:   Your physician recommends that you continue on your current medications as directed. Please refer to the Current Medication list given to you today.   *If you need a refill on your cardiac medications before your next appointment, please call your pharmacy*  Lab Work:  Your physician recommends that you return for a FASTING NMR, fasting after midnight. Patient given paperwork today.    If you have labs (blood work) drawn today and your tests are completely normal, you will receive your results only by: MyChart Message (if you have MyChart) OR A paper copy in the mail If you have any lab test that is abnormal or we need to change your treatment, we will call you to review the results.  Testing/Procedures:  None ordered.   Follow-Up: At [facility], you and your health needs are our priority.  As part of our continuing mission to provide you with exceptional heart care, our providers are all part of one team.  This team includes your primary Cardiologist (physician) and Advanced Practice Providers or APPs (Physician Assistants and Nurse Practitioners) who all work together to provide you with the care you need, when you need it.  Your next appointment:   1 year(s)  Provider:   Rosaline Bane, NP    We recommend signing up for the patient portal called MyChart.  Sign up information is provided on this After Visit Summary.  MyChart is used to connect with patients for Virtual Visits (Telemedicine).  Patients are able to view lab/test results, encounter notes, upcoming appointments, etc.  Non-urgent messages can be sent to your provider as well.   To learn more about what you can do with MyChart, go to [email or web address].   Other Instructions  Your physician wants you to follow-up in: 1 year.  You will receive a reminder letter in the mail two months in advance. If you don't receive a letter, please call our office to schedule the follow-up  appointment.  Adopting a Healthy Lifestyle.   Weight: Know what a healthy weight is for you (roughly BMI <25) and aim to maintain this. You can calculate your body mass index on your smart phone. Unfortunately, this is not the most accurate measure of healthy weight, but it is the simplest measurement to use. A more accurate measurement involves body scanning which measures lean muscle, fat tissue and bony density. We do not have this equipment at [facility].    Diet: Aim for 7+ servings of fruits and vegetables daily Limit animal fats in diet for cholesterol and heart health - choose grass fed whenever available Avoid highly processed foods (fast food burgers, tacos, fried chicken, pizza, hot dogs, french fries)  Saturated fat comes in the form of butter, lard, coconut oil, margarine, partially hydrogenated oils, dairy products, and fat in meat. These increase your risk of cardiovascular disease.  Use healthy plant oils, such as olive, canola, soy, corn, sunflower and peanut.  Whole foods such as fruits, vegetables and whole grains have fiber  Men need > 38 grams of fiber per day Women need > 25 grams of fiber per day  Load up on vegetables and fruits - one-half of your plate: Aim for color and variety, and remember that potatoes dont count. Go for whole grains - one-quarter of your plate: Whole wheat, barley, wheat berries, quinoa, oats, brown rice, and foods made with them. If you want pasta, go with whole wheat pasta. Protein power - one-quarter of your plate: Fish, chicken, beans,  and nuts are all healthy, versatile protein sources. Limit red meat. You need carbohydrates for energy! The type of carbohydrate is more important than the amount. Choose carbohydrates such as vegetables, fruits, whole grains, beans, and nuts in the place of white rice, white pasta, potatoes (baked or fried), macaroni and cheese, cakes, cookies, and donuts.  If youre thirsty, drink water. Coffee and tea are good in  moderation, but skip sugary drinks and limit milk and dairy products to one or two daily servings. Keep sugar intake at 6 teaspoons or 24 grams or LESS       Exercise: Aim for 150 min of moderate intensity exercise weekly for heart health, and weights twice weekly for bone health Stay active - any steps are better than no steps! Aim for 7-9 hours of sleep daily   Sleep: This provides your body with the reset and relaxation that it needs!  Aim to get 7-8 hours of sleep each night. Limit caffeine, screen time, and other distractions prior to bedtime.  Keep your bedroom cool and dark and do not wear heavy clothing to bed or use heavy bed covers - layer if needed.

## 2024-03-03 ENCOUNTER — Other Ambulatory Visit: Payer: Self-pay | Admitting: Family Medicine

## 2024-03-03 DIAGNOSIS — E1165 Type 2 diabetes mellitus with hyperglycemia: Secondary | ICD-10-CM

## 2024-03-03 MED ORDER — OZEMPIC (0.25 OR 0.5 MG/DOSE) 2 MG/3ML ~~LOC~~ SOPN: 0.5000 mg | PEN_INJECTOR | SUBCUTANEOUS | 6 refills | Status: AC

## 2024-03-03 NOTE — Telephone Encounter (Signed)
 Copied from CRM #[id number]. Topic: Clinical - Medication Refill >> Mar 03, 2024 12:08 PM Amber H wrote: Medication: OZEMPIC , 0.25 OR 0.5 MG/DOSE, 2 MG/3ML SOPN  Has the patient contacted their pharmacy? Yes, pharmacy stated no refills.  (Agent: If no, request that the patient contact the pharmacy for the refill. If patient does not wish to contact the pharmacy document the reason why and proceed with request.) (Agent: If yes, when and what did the pharmacy advise?)  This is the patient's preferred pharmacy:  [facility] [place], [place] - [address] [place] [address] [place] [address] Phone: [phone] Fax: [phone]  Is this the correct pharmacy for this prescription? Yes If no, delete pharmacy and type the correct one.   Has the prescription been filled recently? Yes  Is the patient out of the medication? Yes  Has the patient been seen for an appointment in the last year OR does the patient have an upcoming appointment? Yes, last ov 12/04/2023  Can we respond through MyChart? Yes   Agent: Please be advised that Rx refills may take up to 3 business days. We ask that you follow-up with your pharmacy.

## 2024-03-18 ENCOUNTER — Other Ambulatory Visit: Payer: Self-pay | Admitting: Family Medicine

## 2024-03-18 DIAGNOSIS — I10 Essential (primary) hypertension: Secondary | ICD-10-CM

## 2024-03-18 MED ORDER — METOPROLOL SUCCINATE ER 50 MG PO TB24: ORAL_TABLET | ORAL | 1 refills | Status: DC

## 2024-04-05 ENCOUNTER — Other Ambulatory Visit: Payer: Self-pay | Admitting: Family Medicine

## 2024-04-05 ENCOUNTER — Ambulatory Visit: Admitting: Family Medicine

## 2024-04-05 ENCOUNTER — Encounter: Payer: Self-pay | Admitting: Family Medicine

## 2024-04-05 VITALS — BP 99/65 | HR 76 | Temp 98.4°F | Ht 63.0 in | Wt 126.0 lb

## 2024-04-05 DIAGNOSIS — E119 Type 2 diabetes mellitus without complications: Secondary | ICD-10-CM | POA: Diagnosis not present

## 2024-04-05 DIAGNOSIS — I10 Essential (primary) hypertension: Secondary | ICD-10-CM

## 2024-04-05 DIAGNOSIS — Z1329 Encounter for screening for other suspected endocrine disorder: Secondary | ICD-10-CM | POA: Diagnosis not present

## 2024-04-05 DIAGNOSIS — Z7985 Long-term (current) use of injectable non-insulin antidiabetic drugs: Secondary | ICD-10-CM | POA: Diagnosis not present

## 2024-04-05 DIAGNOSIS — Z0000 Encounter for general adult medical examination without abnormal findings: Secondary | ICD-10-CM

## 2024-04-05 DIAGNOSIS — Z13220 Encounter for screening for lipoid disorders: Secondary | ICD-10-CM

## 2024-04-05 DIAGNOSIS — Z136 Encounter for screening for cardiovascular disorders: Secondary | ICD-10-CM | POA: Diagnosis not present

## 2024-04-05 DIAGNOSIS — Z23 Encounter for immunization: Secondary | ICD-10-CM | POA: Diagnosis not present

## 2024-04-05 MED ORDER — METOPROLOL SUCCINATE ER 50 MG PO TB24: ORAL_TABLET | ORAL | 1 refills | Status: AC

## 2024-04-05 MED ORDER — TRIAMTERENE-HCTZ 37.5-25 MG PO TABS: 1.0000 | ORAL_TABLET | Freq: Every day | ORAL | 1 refills | Status: AC

## 2024-04-05 NOTE — Progress Notes (Signed)
 "  Complete physical exam  Patient: Marissa Guzman   DOB: 1965-05-08   59 y.o. Female  MRN: [id number]  Subjective:    Chief Complaint  Patient presents with   Annual Exam    Fasting Tdap today    Marissa Guzman is a 59 y.o. female who presents today for a complete physical exam. She reports consuming a general diet. The patient does not participate in regular exercise at present. She generally feels well. She reports sleeping well. She does not have additional problems to discuss today.    Most recent fall risk assessment:    04/05/2024    9:10 AM  Fall Risk   Falls in the past year? 0  Number falls in past yr: 0  Injury with Fall? 0  Risk for fall due to : No Fall Risks  Follow up Falls evaluation completed     Most recent depression screenings:    04/05/2024    9:11 AM 12/04/2023    9:09 AM  PHQ 2/9 Scores  PHQ - 2 Score 0 0  PHQ- 9 Score 0 0      Data saved with a previous flowsheet row definition    Vision:Within last year  Patient Active Problem List   Diagnosis Date Noted   Lentigo 08/03/2023   Melanocytic nevus of trunk 08/03/2023   Angioma of skin 08/03/2023   Skin sensation disturbance 08/03/2023   Bloating 10/01/2022   Type 2 diabetes mellitus with hyperglycemia, without long-term current use of insulin  (HCC) 07/02/2022   Neck pain 09/05/2021   Insomnia 01/09/2020   Primary hypertension 12/29/2018   Rheumatoid arthritis (HCC) 09/26/2018   Dyspnea 01/04/2018   Hypercalcemia 01/04/2018   Renal insufficiency 12/24/2017   Edema 12/13/2017   Dizziness 12/07/2017   Myalgia 10/11/2017   Overweight 08/14/2016   Hip pain, bilateral 08/19/2015   Constipation 04/16/2014   GERD (gastroesophageal reflux disease) 06/12/2013   Hypokalemia 11/14/2012   Allergy 05/16/2012   Preventative health care 05/11/2012   Hypertension, benign essential, goal below 140/90 10/25/2011   Hypercholesterolemia 10/25/2011   Past Medical History:  Diagnosis Date   Acute  bronchitis 05/16/2012   Allergic state 05/16/2012   Allergy    Constipation 04/16/2014   Diabetes mellitus without complication (HCC)    DM2 (diabetes mellitus, type 2) (HCC) 08/10/2015   Fibromyalgia    GERD (gastroesophageal reflux disease) 06/12/2013   Hip pain, bilateral 08/19/2015   HTN (hypertension)    Hyperglycemia 08/10/2015   Hyperlipidemia    Hypokalemia 11/14/2012   Osteoarthritis    Overweight 08/14/2016   Rheumatoid arthritis (HCC)    Seasonal allergies    Past Surgical History:  Procedure Laterality Date   CESAREAN SECTION     HEMORRHOID SURGERY     two   Social History   Socioeconomic History   Marital status: Married    Spouse name: Exie   Number of children: 1   Years of education: 12   Highest education level: Not on file  Occupational History    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  Tobacco Use   Smoking status: Never   Smokeless tobacco: Never  Vaping Use   Vaping status: Never Used  Substance and Sexual Activity   Alcohol use: No   Drug use: No   Sexual activity: Yes    Comment: lives with husband, no dietary restrictions, 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 [employer]  Other Topics Concern   Not on file  Social History Narrative  Lives with husband   Caffeine- coffee 1 c   Right handed   Social Drivers of Health   Tobacco Use: Low Risk (04/05/2024)   Patient History    Smoking Tobacco Use: Never    Smokeless Tobacco Use: Never    Passive Exposure: Not on file  Financial Resource Strain: Not on file  Food Insecurity: Not on file  Transportation Needs: Not on file  Physical Activity: Not on file  Stress: Not on file  Social Connections: Not on file  Intimate Partner Violence: Not on file  Depression (PHQ2-9): Low Risk (04/05/2024)   Depression (PHQ2-9)    PHQ-2 Score: 0  Alcohol Screen: Not on file  Housing: Not on file  Utilities: Not on file  Health Literacy: Not on file   Family History  Problem Relation Age of Onset   Heart  disease Father        Deceased   Hypertension Father    Lung cancer Father    Melanoma Father        Eye Removal   Hyperlipidemia Mother    Hypertension Mother    Colon polyps Mother    Arthritis/Rheumatoid Mother    Hyperlipidemia Maternal Grandmother    Diabetes Maternal Grandmother    Dementia Maternal Grandmother    Hypertension Sister        x2   Colon polyps Sister    Lupus Sister    Hyperlipidemia Sister        x2   Colon polyps Sister    Colon cancer Neg Hx    Esophageal cancer Neg Hx    Rectal cancer Neg Hx    Stomach cancer Neg Hx    Allergies[1]    Patient Care Team: Colette Torrence GRADE, MD as PCP - General (Family Medicine) Genette Charlie GAILS, MD as Consulting Physician (Obstetrics and Gynecology) [facility] [place], [place]   Show/hide medication list[2]  Review of Systems  All other systems reviewed and are negative.         Objective:     BP 99/65 (BP Location: Left Arm, Patient Position: Sitting, Cuff Size: Normal)   Pulse 76   Temp 98.4 F (36.9 C) (Oral)   Ht 5' 3 (1.6 m)   Wt 126 lb (57.2 kg)   SpO2 98%   BMI 22.32 kg/m  BP Readings from Last 3 Encounters:  04/05/24 99/65  02/17/24 124/76  12/04/23 101/64      Physical Exam Vitals and nursing note reviewed.  Constitutional:      Appearance: Normal appearance. She is normal weight.  HENT:     Head: Normocephalic and atraumatic.     Right Ear: Tympanic membrane, ear canal and external ear normal.     Left Ear: Tympanic membrane, ear canal and external ear normal.     Nose: Nose normal.     Mouth/Throat:     Mouth: Mucous membranes are moist.     Pharynx: Oropharynx is clear.  Eyes:     Conjunctiva/sclera: Conjunctivae normal.     Pupils: Pupils are equal, round, and reactive to light.  Cardiovascular:     Rate and Rhythm: Normal rate and regular rhythm.     Pulses: Normal pulses.     Heart sounds: Normal heart sounds.  Pulmonary:     Effort: Pulmonary effort is normal.      Breath sounds: Normal breath sounds.  Abdominal:     General: Abdomen is flat. Bowel sounds are normal.  Skin:  General: Skin is warm.     Capillary Refill: Capillary refill takes less than 2 seconds.  Neurological:     General: No focal deficit present.     Mental Status: She is alert and oriented to person, place, and time. Mental status is at baseline.  Psychiatric:        Mood and Affect: Mood normal.        Behavior: Behavior normal.        Thought Content: Thought content normal.        Judgment: Judgment normal.     No results found for any visits on 04/05/24. Last CBC Lab Results  Component Value Date   WBC 6.4 03/06/2022   HGB 13.9 03/06/2022   HCT 40.7 03/06/2022   MCV 86.1 03/06/2022   MCH 30.0 01/09/2020   RDW 12.9 03/06/2022   PLT 235.0 03/06/2022   Last metabolic panel Lab Results  Component Value Date   GLUCOSE 117 (H) 10/01/2022   NA 141 10/01/2022   K 4.6 10/01/2022   CL 103 10/01/2022   CO2 30 10/01/2022   BUN 13 10/01/2022   CREATININE 0.77 10/01/2022   EGFR 90 10/01/2022   CALCIUM  10.0 10/01/2022   PHOS 2.5 02/02/2014   PROT 7.0 10/01/2022   ALBUMIN 4.6 03/06/2022   LABGLOB 2.6 05/17/2019   BILITOT 0.7 10/01/2022   ALKPHOS 73 03/06/2022   AST 26 10/01/2022   ALT 18 10/01/2022   Last lipids Lab Results  Component Value Date   CHOL 157 03/06/2022   HDL 56.30 03/06/2022   LDLCALC 72 03/06/2022   LDLDIRECT 88.0 12/10/2018   TRIG 144.0 03/06/2022   CHOLHDL 3 03/06/2022   Last hemoglobin A1c Lab Results  Component Value Date   HGBA1C 5.9 (A) 12/04/2023   Last thyroid  functions Lab Results  Component Value Date   TSH 1.40 03/06/2022        Assessment & Plan:    Routine Health Maintenance and Physical Exam  Immunization History  Administered Date(s) Administered   Influenza,inj,Quad PF,6+ Mos 12/10/2018, 01/09/2020   Influenza-Unspecified 12/10/2018, 01/09/2020   Moderna SARS-COV2 Booster Vaccination 02/14/2020   Moderna  Sars-Covid-2 Vaccination 03/26/2019, 06/30/2019   PNEUMOCOCCAL CONJUGATE-20 07/09/2020   Tdap 02/01/2013   Unspecified SARS-COV-2 Vaccination 03/26/2019, 06/30/2019, 02/14/2020   Zoster Recombinant(Shingrix ) 12/10/2018, 02/15/2019   Zoster, Unspecified 12/10/2018, 02/15/2019    Health Maintenance  Topic Date Due   Diabetic kidney evaluation - Urine ACR  Never done   Hepatitis B Vaccines 19-59 Average Risk (1 of 3 - 19+ 3-dose series) Never done   DTaP/Tdap/Td (2 - Td or Tdap) 02/02/2023   Diabetic kidney evaluation - eGFR measurement  10/01/2023   OPHTHALMOLOGY EXAM  12/23/2023   HEMOGLOBIN A1C  06/02/2024   Mammogram  06/22/2024   FOOT EXAM  12/03/2024   Cervical Cancer Screening (HPV/Pap Cotest)  11/01/2028   Colonoscopy  10/03/2031   Pneumococcal Vaccine: 50+ Years  Completed   HPV VACCINES (No Doses Required) Completed   Hepatitis C Screening  Completed   Zoster Vaccines- Shingrix   Completed   Meningococcal B Vaccine  Aged Out   Influenza Vaccine  Discontinued   COVID-19 Vaccine  Discontinued   HIV Screening  Discontinued    Discussed health benefits of physical activity, and encouraged her to engage in regular exercise appropriate for her age and condition.  Problem List Items Addressed This Visit       Cardiovascular and Mediastinum   Primary hypertension   Other Visit Diagnoses  Type 2 diabetes mellitus without complication, without long-term current use of insulin  (HCC)    -  Primary   Relevant Orders   Hemoglobin A1c   CBC with Differential/Platelet   CMP14+EGFR     Annual physical exam         Encounter for lipid screening for cardiovascular disease       Relevant Orders   Lipid panel     Screening for thyroid  disorder       Relevant Orders   TSH     Immunization due       Relevant Orders   Tdap vaccine greater than or equal to 7yo IM      No follow-ups on file. Type 2 diabetes mellitus without complication, without long-term current use of  insulin  (HCC) -     Hemoglobin A1c -     CBC with Differential/Platelet -     CMP14+EGFR  Primary hypertension  Annual physical exam  Encounter for lipid screening for cardiovascular disease -     Lipid panel  Screening for thyroid  disorder -     TSH  Immunization due -     Tdap vaccine greater than or equal to 7yo IM   Screening labs Tdap today.     Torrence CINDERELLA Barrier, MD      [1]  Allergies Allergen Reactions   Fenofibrate Other (See Comments)    myalgias   Doxycycline Monohydrate     GI Issues.   Niacin  And Related     Extreme flushing despite taking it correctly   Statins Other (See Comments)    Livalo , crestor  myalgias   Tetracyclines & Related Other (See Comments)    GI Issues.  [2]  Outpatient Medications Prior to Visit  Medication Sig   blood glucose meter kit and supplies KIT Dispense based on patient and insurance preference. Check BS  daily as directed.  DzE11.9   cetirizine (ZYRTEC) 10 MG tablet Take 10 mg by mouth daily as needed.   Evolocumab  (REPATHA  SURECLICK) 140 MG/ML SOAJ Inject 140 mg into the skin every 14 (fourteen) days.   fluticasone (FLONASE) 50 MCG/ACT nasal spray Place 2 sprays into the nose daily as needed.   metoprolol  succinate (TOPROL -XL) 50 MG 24 hr tablet Take 1.5 tablets (75 mg total) by mouth in the morning AND 1 tablet (50 mg total) every evening.   omeprazole (PRILOSEC) 20 MG capsule Take 20 mg by mouth daily.   ONE TOUCH ULTRA TEST test strip    ONETOUCH DELICA LANCETS 33G MISC    Semaglutide ,0.25 or 0.5MG /DOS, (OZEMPIC , 0.25 OR 0.5 MG/DOSE,) 2 MG/3ML SOPN Inject 0.5 mg into the skin once a week.   triamterene -hydrochlorothiazide  (MAXZIDE -25) 37.5-25 MG tablet Take 1 tablet by mouth daily.   Estradiol  Starter Pack (IMVEXXY  STARTER PACK) 10 MCG INST Place vaginally. (Patient not taking: Reported on 04/05/2024)   No facility-administered medications prior to visit.   "

## 2024-04-06 ENCOUNTER — Ambulatory Visit: Payer: Self-pay | Admitting: Family Medicine

## 2024-04-06 LAB — CMP14+EGFR
ALT: 13 IU/L (ref 0–32)
AST: 22 IU/L (ref 0–40)
Albumin: 4.4 g/dL (ref 3.8–4.9)
Alkaline Phosphatase: 83 IU/L (ref 49–135)
BUN/Creatinine Ratio: 14 (ref 9–23)
BUN: 14 mg/dL (ref 6–24)
Bilirubin Total: 0.8 mg/dL (ref 0.0–1.2)
CO2: 24 mmol/L (ref 20–29)
Calcium: 9.5 mg/dL (ref 8.7–10.2)
Chloride: 103 mmol/L (ref 96–106)
Creatinine, Ser: 1 mg/dL (ref 0.57–1.00)
Globulin, Total: 2.8 g/dL (ref 1.5–4.5)
Glucose: 87 mg/dL (ref 70–99)
Potassium: 3.8 mmol/L (ref 3.5–5.2)
Sodium: 143 mmol/L (ref 134–144)
Total Protein: 7.2 g/dL (ref 6.0–8.5)
eGFR: 65 mL/min/1.73

## 2024-04-06 LAB — CBC WITH DIFFERENTIAL/PLATELET
Basophils Absolute: 0 x10E3/uL (ref 0.0–0.2)
Basos: 1 %
EOS (ABSOLUTE): 0.1 x10E3/uL (ref 0.0–0.4)
Eos: 1 %
Hematocrit: 41.7 % (ref 34.0–46.6)
Hemoglobin: 13.7 g/dL (ref 11.1–15.9)
Immature Grans (Abs): 0 x10E3/uL (ref 0.0–0.1)
Immature Granulocytes: 0 %
Lymphocytes Absolute: 2.2 x10E3/uL (ref 0.7–3.1)
Lymphs: 38 %
MCH: 29.7 pg (ref 26.6–33.0)
MCHC: 32.9 g/dL (ref 31.5–35.7)
MCV: 90 fL (ref 79–97)
Monocytes Absolute: 0.5 x10E3/uL (ref 0.1–0.9)
Monocytes: 8 %
Neutrophils Absolute: 3 x10E3/uL (ref 1.4–7.0)
Neutrophils: 52 %
Platelets: 207 x10E3/uL (ref 150–450)
RBC: 4.62 x10E6/uL (ref 3.77–5.28)
RDW: 12.5 % (ref 11.7–15.4)
WBC: 5.7 x10E3/uL (ref 3.4–10.8)

## 2024-04-06 LAB — TSH: TSH: 1.49 u[IU]/mL (ref 0.450–4.500)

## 2024-04-06 LAB — LIPID PANEL
Chol/HDL Ratio: 3.1 ratio (ref 0.0–4.4)
Cholesterol, Total: 168 mg/dL (ref 100–199)
HDL: 54 mg/dL
LDL Chol Calc (NIH): 78 mg/dL (ref 0–99)
Triglycerides: 215 mg/dL — ABNORMAL HIGH (ref 0–149)
VLDL Cholesterol Cal: 36 mg/dL (ref 5–40)

## 2024-04-06 LAB — HEMOGLOBIN A1C
Est. average glucose Bld gHb Est-mCnc: 120 mg/dL
Hgb A1c MFr Bld: 5.8 % — ABNORMAL HIGH (ref 4.8–5.6)

## 2024-04-12 ENCOUNTER — Other Ambulatory Visit (HOSPITAL_COMMUNITY): Payer: Self-pay

## 2024-04-12 ENCOUNTER — Telehealth: Payer: Self-pay | Admitting: Pharmacy Technician

## 2024-04-12 ENCOUNTER — Telehealth: Payer: Self-pay

## 2024-04-12 NOTE — Telephone Encounter (Signed)
 Pharmacy Patient Advocate Encounter   Received notification from [facility] that prior authorization for Semaglutide ,0.25 or 0.5MG /DOS, (OZEMPIC , 0.25 OR 0.5 MG/DOSE,) 2 MG/3ML SOPN  is required/requested.   Insurance verification completed.   The patient is insured through [facility].   Per test claim: PA required; PA submitted to above mentioned insurance via Latent Key/confirmation #/EOC [id number] Status is pending

## 2024-04-12 NOTE — Telephone Encounter (Signed)
 Pharmacy Patient Advocate Encounter   Received notification from [facility] that prior authorization for repatha  is required/requested.   Insurance verification completed.   The patient is insured through [facility].   Per test claim: PA required; PA submitted to above mentioned insurance via Latent Key/confirmation #/EOC [id number] Status is pending

## 2024-04-13 NOTE — Telephone Encounter (Signed)
 Pharmacy Patient Advocate Encounter  Received notification from [facility] that Prior Authorization for repatha  has been APPROVED from 04/12/24 to 04/12/27   PA #/Case ID/Reference #: [id number]

## 2024-04-18 ENCOUNTER — Other Ambulatory Visit (HOSPITAL_COMMUNITY): Payer: Self-pay

## 2024-04-18 NOTE — Telephone Encounter (Signed)
 Pharmacy Patient Advocate Encounter  Received notification from [facility] that Prior Authorization for Ozempic  (0.25 or 0.5 MG/DOSE) 2MG /3ML pen-injectors   has been APPROVED from 04/15/2023 to 04/14/2025. Ran test claim, Copay is $49.99 SEE TEST CLAIM BELOW. This test claim was processed through [facility]- copay amounts may vary at other pharmacies due to pharmacy/plan contracts, or as the patient moves through the different stages of their insurance plan.   PA #/Case ID/Reference #: [id number]

## 2024-10-03 ENCOUNTER — Ambulatory Visit: Admitting: Family Medicine
